# Patient Record
Sex: Female | Born: 1960 | Race: White | Hispanic: No | Marital: Married | Smoking: Current every day smoker
Health system: Southern US, Community
[De-identification: ages and names within clinical notes are randomized; demographics above are authoritative.]

## PROBLEM LIST (undated history)

## (undated) DIAGNOSIS — R531 Weakness: Secondary | ICD-10-CM

## (undated) DIAGNOSIS — M1990 Unspecified osteoarthritis, unspecified site: Secondary | ICD-10-CM

## (undated) DIAGNOSIS — K219 Gastro-esophageal reflux disease without esophagitis: Secondary | ICD-10-CM

## (undated) DIAGNOSIS — F419 Anxiety disorder, unspecified: Secondary | ICD-10-CM

## (undated) DIAGNOSIS — G5600 Carpal tunnel syndrome, unspecified upper limb: Secondary | ICD-10-CM

## (undated) DIAGNOSIS — F32A Depression, unspecified: Secondary | ICD-10-CM

## (undated) DIAGNOSIS — M797 Fibromyalgia: Secondary | ICD-10-CM

## (undated) DIAGNOSIS — J449 Chronic obstructive pulmonary disease, unspecified: Secondary | ICD-10-CM

## (undated) DIAGNOSIS — N200 Calculus of kidney: Secondary | ICD-10-CM

## (undated) DIAGNOSIS — R011 Cardiac murmur, unspecified: Secondary | ICD-10-CM

## (undated) DIAGNOSIS — R4701 Aphasia: Secondary | ICD-10-CM

## (undated) DIAGNOSIS — I10 Essential (primary) hypertension: Secondary | ICD-10-CM

## (undated) DIAGNOSIS — M719 Bursopathy, unspecified: Secondary | ICD-10-CM

## (undated) DIAGNOSIS — E785 Hyperlipidemia, unspecified: Secondary | ICD-10-CM

## (undated) HISTORY — DX: Fibromyalgia: M79.7

## (undated) HISTORY — DX: Chronic obstructive pulmonary disease, unspecified: J44.9

## (undated) HISTORY — DX: Hyperlipidemia, unspecified: E78.5

## (undated) HISTORY — DX: Depression, unspecified: F32.A

## (undated) HISTORY — DX: Gastro-esophageal reflux disease without esophagitis: K21.9

## (undated) HISTORY — DX: Essential (primary) hypertension: I10

## (undated) HISTORY — DX: Unspecified osteoarthritis, unspecified site: M19.90

## (undated) HISTORY — PX: OOPHORECTOMY: SHX86

## (undated) HISTORY — DX: Calculus of kidney: N20.0

## (undated) HISTORY — PX: OTHER SURGICAL HISTORY: SHX169

## (undated) HISTORY — DX: Anxiety disorder, unspecified: F41.9

## (undated) HISTORY — PX: ORIF ANKLE FRACTURE BIMALLEOLAR: SUR920

## (undated) HISTORY — DX: Carpal tunnel syndrome, unspecified upper limb: G56.00

## (undated) HISTORY — PX: CHOLECYSTECTOMY: SHX55

## (undated) HISTORY — DX: Bursopathy, unspecified: M71.9

## (undated) HISTORY — PX: CARPAL TUNNEL RELEASE: SHX101

## (undated) HISTORY — PX: ABLATION: SHX5711

## (undated) HISTORY — PX: EXCISIONAL HEMORRHOIDECTOMY: SHX1541

## (undated) HISTORY — PX: ENDOMETRIAL ABLATION: SHX621

## (undated) HISTORY — PX: ANKLE FRACTURE SURGERY: SHX122

---

## 2007-10-13 DIAGNOSIS — N809 Endometriosis, unspecified: Secondary | ICD-10-CM | POA: Insufficient documentation

## 2007-10-13 DIAGNOSIS — Z87442 Personal history of urinary calculi: Secondary | ICD-10-CM | POA: Insufficient documentation

## 2008-11-07 DIAGNOSIS — M5137 Other intervertebral disc degeneration, lumbosacral region: Secondary | ICD-10-CM | POA: Insufficient documentation

## 2012-08-22 DIAGNOSIS — IMO0002 Reserved for concepts with insufficient information to code with codable children: Secondary | ICD-10-CM | POA: Insufficient documentation

## 2012-08-22 DIAGNOSIS — M5030 Other cervical disc degeneration, unspecified cervical region: Secondary | ICD-10-CM | POA: Insufficient documentation

## 2014-05-06 DIAGNOSIS — F339 Major depressive disorder, recurrent, unspecified: Secondary | ICD-10-CM | POA: Insufficient documentation

## 2014-05-06 DIAGNOSIS — G4700 Insomnia, unspecified: Secondary | ICD-10-CM | POA: Insufficient documentation

## 2014-05-06 DIAGNOSIS — F32A Depression, unspecified: Secondary | ICD-10-CM | POA: Insufficient documentation

## 2014-10-27 DIAGNOSIS — K649 Unspecified hemorrhoids: Secondary | ICD-10-CM

## 2014-10-27 HISTORY — DX: Unspecified hemorrhoids: K64.9

## 2014-11-17 DIAGNOSIS — G894 Chronic pain syndrome: Secondary | ICD-10-CM | POA: Insufficient documentation

## 2015-03-25 DIAGNOSIS — S82852A Displaced trimalleolar fracture of left lower leg, initial encounter for closed fracture: Secondary | ICD-10-CM | POA: Insufficient documentation

## 2015-07-05 DIAGNOSIS — K219 Gastro-esophageal reflux disease without esophagitis: Secondary | ICD-10-CM | POA: Insufficient documentation

## 2016-08-26 DIAGNOSIS — F419 Anxiety disorder, unspecified: Secondary | ICD-10-CM | POA: Insufficient documentation

## 2016-08-27 ENCOUNTER — Ambulatory Visit (INDEPENDENT_AMBULATORY_CARE_PROVIDER_SITE_OTHER): Payer: Self-pay

## 2016-08-27 ENCOUNTER — Encounter (INDEPENDENT_AMBULATORY_CARE_PROVIDER_SITE_OTHER): Payer: Self-pay | Admitting: Orthopaedic Surgery

## 2016-08-27 ENCOUNTER — Ambulatory Visit (INDEPENDENT_AMBULATORY_CARE_PROVIDER_SITE_OTHER): Payer: BLUE CROSS/BLUE SHIELD | Admitting: Orthopaedic Surgery

## 2016-08-27 ENCOUNTER — Ambulatory Visit (INDEPENDENT_AMBULATORY_CARE_PROVIDER_SITE_OTHER): Payer: Self-pay | Admitting: Orthopaedic Surgery

## 2016-08-27 VITALS — BP 127/74 | HR 64 | Resp 15 | Ht 66.0 in | Wt 164.0 lb

## 2016-08-27 DIAGNOSIS — R200 Anesthesia of skin: Secondary | ICD-10-CM

## 2016-08-27 DIAGNOSIS — R202 Paresthesia of skin: Secondary | ICD-10-CM | POA: Diagnosis not present

## 2016-08-27 DIAGNOSIS — M25432 Effusion, left wrist: Secondary | ICD-10-CM | POA: Diagnosis not present

## 2016-08-27 DIAGNOSIS — M25532 Pain in left wrist: Secondary | ICD-10-CM

## 2016-08-27 MED ORDER — DICLOFENAC SODIUM 1 % TD GEL: 2.0000 g | Freq: Four times a day (QID) | TRANSDERMAL | 1 refills | Status: DC

## 2016-08-27 NOTE — Progress Notes (Signed)
Office Visit Note   [name]           Date of Birth: 1960/12/03           MRN: [id number] Visit Date: 08/27/2016              Requested by: No referring provider defined for this encounter. PCP: Angelica Chessman, MD   Assessment & Plan: Visit Diagnoses:  1. Pain and swelling of wrist, left   2. Numbness and tingling of left hand     Plan:  #1: Thumb spica splint to the left hand #2: Voltaren gel to the left wrist #3: Follow back up in 2 weeks for recheck evaluation.   Follow-Up Instructions: Return in about 2 weeks (around 09/10/2016).   Orders:  Orders Placed This Encounter  Procedures  . XR Wrist Complete Left   Meds ordered this encounter  Medications  . diclofenac sodium (VOLTAREN) 1 % GEL    Sig: Apply 2 g topically 4 (four) times daily.    Dispense:  2 Tube    Refill:  1    Order Specific Question:   Supervising Provider    Answer:   [name] [[id number]]      Procedures: No procedures performed   Clinical Data: No additional findings.   Subjective: Chief Complaint  Patient presents with  . Left Wrist - Pain  . Wrist Pain    CTS - 2010, pain, limited range of motion, swelling, no surgery to wrist, not diabtic, no injury, lump, tenderness, no meds, wears brace    Susan Lewis is a 56 year old white female who presents today with significant pain in the left wrist and numbness in the left hand which occurred approximately 3 weeks ago after suddenly stopping gabapentin of 600 mg 4 times a day. She had significant pain and discomfort in the left wrist as well as the numbness that had been earlier diagnosis carpal tunnel syndrome in 2010. She now complains limited range of motion. She has used a brace which is only somewhat beneficial. She is also developed swelling in the radial aspect of the wrist and over the first dorsal compartment. Seen today for evaluation.      Review of Systems  Constitutional: Negative.   HENT: Negative.     Respiratory: Negative.   Cardiovascular: Negative.   Gastrointestinal: Negative.   Genitourinary: Negative.   Skin: Negative.   Neurological: Negative.   Hematological: Negative.   Psychiatric/Behavioral: Negative.      Objective: Vital Signs: BP 127/74 (BP Location: Right Arm, Patient Position: Sitting, Cuff Size: Normal)   Pulse 64   Resp 15   Ht 5\' 6"  (1.676 m)   Wt 164 lb (74.4 kg)   BMI 26.47 kg/m   Physical Exam  Constitutional: She is oriented to person, place, and time. She appears well-developed and well-nourished.  HENT:  Head: Normocephalic and atraumatic.  Eyes: EOM are normal. Pupils are equal, round, and reactive to light.  Pulmonary/Chest: Effort normal.  Neurological: She is alert and oriented to person, place, and time.  Skin: Skin is warm and dry.  Psychiatric: She has a normal mood and affect. Her behavior is normal. Judgment and thought content normal.    Ortho Exam  She has tenderness to palpation over the radial styloid area. She does have little bit of a swelling over the radial styloid. Skin is intact. She has some decreased sensation in the median nerve distribution. Positive Phalen's test. Positive Finkelstein. She has equal  strength in opposition testing bilaterally Specialty Comments:  No specialty comments available.  Imaging: Xr Wrist Complete Left  Result Date: 08/27/2016 Three-view x-ray of the left wrist reveals what may be an evulsion of bone off the radial styloid.. She also has some cystic changes in the scapholunate area.    PMFS History: There are no active problems to display for this patient.  Past Medical History:  Diagnosis Date  . Arthritis   . Bursitis   . Carpal tunnel syndrome   . Fibromyalgia   . Kidney stones     History reviewed. No pertinent family history.  Past Surgical History:  Procedure Laterality Date  . ABLATION    . ANKLE FRACTURE SURGERY    . CESAREAN SECTION    . CHOLECYSTECTOMY    . LEFT OVARY  REMOVAL     Social History   Occupational History  . Not on file.   Social History Main Topics  . Smoking status: Current Every Day Smoker    Packs/day: 1.00    Years: 45.00    Types: Cigarettes    Start date: 08/28/1971  . Smokeless tobacco: Never Used  . Alcohol use No  . Drug use: No  . Sexual activity: Not on file

## 2016-09-20 ENCOUNTER — Ambulatory Visit (INDEPENDENT_AMBULATORY_CARE_PROVIDER_SITE_OTHER): Payer: BLUE CROSS/BLUE SHIELD | Admitting: Orthopaedic Surgery

## 2016-09-20 ENCOUNTER — Encounter (INDEPENDENT_AMBULATORY_CARE_PROVIDER_SITE_OTHER): Payer: Self-pay | Admitting: Orthopaedic Surgery

## 2016-09-20 VITALS — BP 126/71 | HR 63 | Ht 66.0 in | Wt 155.0 lb

## 2016-09-20 DIAGNOSIS — M79642 Pain in left hand: Secondary | ICD-10-CM

## 2016-09-20 DIAGNOSIS — M654 Radial styloid tenosynovitis [de Quervain]: Secondary | ICD-10-CM

## 2016-09-20 DIAGNOSIS — M79641 Pain in right hand: Secondary | ICD-10-CM

## 2016-09-20 MED ORDER — LIDOCAINE HCL 2 % IJ SOLN
1.0000 mL | INTRAMUSCULAR | Status: AC | PRN
  Administered 2016-09-20: 1 mL

## 2016-09-20 MED ORDER — METHYLPREDNISOLONE ACETATE 40 MG/ML IJ SUSP
40.0000 mg | INTRAMUSCULAR | Status: AC | PRN
  Administered 2016-09-20: 40 mg

## 2016-09-20 NOTE — Progress Notes (Signed)
Office Visit Note   [name]           Date of Birth: 02/01/1961           MRN: [id number] Visit Date: 09/20/2016              Requested by: Angelica ChessmanAguiar, Rafaela M, MD [address], [place] [id number] PCP: Angelica ChessmanAguiar, Rafaela M, MD   Assessment & Plan: Visit Diagnoses:  1. Bilateral hand pain   2. Pain of left hand   De Quervain's and carpal tunnel syndrome left wrist  Plan: Cortisone injection first dorsal extensor compartment left wrist. EMGs and nerve conduction studies both upper extremities. Continue with splint immobilization on the left.  Follow-Up Instructions: Return after EMG's.   Orders:  Orders Placed This Encounter  Procedures  . Ambulatory referral to Physical Medicine Rehab   No orders of the defined types were placed in this encounter.     Procedures: Hand/UE Inj Date/Time: 09/20/2016 9:15 AM Performed by: Valeria BatmanWHITFIELD, Canuto Kingston W Authorized by: Valeria BatmanWHITFIELD, Emlyn Maves W   Consent Given by:  Patient Indications:  Joint swelling Condition: de Quervain's   Site:  L extensor compartment 1 Needle Size:  27 G Approach:  Radial Medications:  1 mL lidocaine 2 %; 40 mg methylPREDNISolone acetate 40 MG/ML     Clinical Data: No additional findings.   Subjective: Chief Complaint  Patient presents with  . Left Wrist - Pain    [name] is a 56 y o that presents with L wrist Pain and swelling .  Numbness and tingling of left hand . She has been wearing the thumb spica and voltaren gel. Less painful.    Susan Lewis has a combination of de Quervain's and carpal tunnel syndrome of the left wrist. I tried a period. Of immobilization and she is still having some trouble.  HPI  Review of Systems   Objective: Vital Signs: BP 126/71   Pulse 63   Ht 5\' 6"  (1.676 m)   Wt 155 lb (70.3 kg)   BMI 25.02 kg/m   Physical Exam  Ortho Exam local tenderness over the first dorsal extensor compartment along the radial aspect of the left wrist. Mild  swelling. No skin change. Neurovascular exam intact. No pain with motion of the base of the thumb. Positive Phalen's and Tinel's left wrist. Full range of motion of fingers. Able to oppose thumb to little finger with good strength.  Specialty Comments:  No specialty comments available.  Imaging: No results found.   PMFS History: There are no active problems to display for this patient.  Past Medical History:  Diagnosis Date  . Arthritis   . Bursitis   . Carpal tunnel syndrome   . Fibromyalgia   . Kidney stones     History reviewed. No pertinent family history.  Past Surgical History:  Procedure Laterality Date  . ABLATION    . ANKLE FRACTURE SURGERY    . CESAREAN SECTION    . CHOLECYSTECTOMY    . LEFT OVARY REMOVAL     Social History   Occupational History  . Not on file.   Social History Main Topics  . Smoking status: Current Every Day Smoker    Packs/day: 1.00    Years: 45.00    Types: Cigarettes    Start date: 08/28/1971  . Smokeless tobacco: Never Used  . Alcohol use No  . Drug use: No  . Sexual activity: Not on file  Susan Balding, MD   Note - This record has been created using [employer].  Chart creation errors have been sought, but may not always  have been located. Such creation errors do not reflect on  the standard of medical care.

## 2016-10-17 ENCOUNTER — Encounter (INDEPENDENT_AMBULATORY_CARE_PROVIDER_SITE_OTHER): Payer: Self-pay | Admitting: Physical Medicine and Rehabilitation

## 2016-10-17 ENCOUNTER — Ambulatory Visit (INDEPENDENT_AMBULATORY_CARE_PROVIDER_SITE_OTHER): Payer: BLUE CROSS/BLUE SHIELD | Admitting: Physical Medicine and Rehabilitation

## 2016-10-17 VITALS — Ht 66.0 in | Wt 160.0 lb

## 2016-10-17 DIAGNOSIS — R202 Paresthesia of skin: Secondary | ICD-10-CM | POA: Diagnosis not present

## 2016-10-18 NOTE — Progress Notes (Signed)
Susan Lewis - 56 y.o. female MRN [id number]  Date of birth: 04/23/1960  Office Visit Note: Visit Date: 10/17/2016 PCP: Susan Lewis, Rafaela M, MD Referred by: Susan Lewis, Rafaela M, MD  Subjective: Chief Complaint  Patient presents with  . Right Wrist - Pain  . Left Wrist - Pain, Numbness   HPI: Susan Lewis is a 56 year old right-hand dominant female with chronic worsening history of pain with numbness and tingling in both hands with actually the left hand pain worse than the right hand pain. She reports initial symptoms on the right side with progressive numbness which is now constant on the right in the radial digits. She does have other pain complaints but no frank radicular pain down the arms. She does have pain with opening jars and she has been dropping objects on the right. She has felt like she has decreased strength on the right. She reports no prior carpal tunnel release. She does report prior electrodiagnostic studies. These were done approximately in 2010. She is unsure where she had those done. They did tell her that she should consider surgery on the right hand at that point but she did not do it because of financial reasons. She reports progressive worsening since that point. Unfortunately we do not have those electrodiagnostic studies to review. Her case is complicated by history of fibromyalgia.    ROS Otherwise per HPI.  Assessment & Plan: Visit Diagnoses:  1. Paresthesia of skin     Plan: No additional findings.  Impression: The above electrodiagnostic study is ABNORMAL and reveals evidence of a severe right median nerve entrapment at the wrist (carpal tunnel syndrome) affecting sensory and motor components. The lesion is characterized by sensory and motor demyelination with evidence of significant axonal injury.   There is no significant electrodiagnostic evidence of any other focal nerve entrapment (specifically no left-sided median nerve slowing), brachial plexopathy or  generalized peripheral neuropathy.   Recommendations: 1.  Follow-up with referring physician. 2.  Continue current management of symptoms. 3.  Suggest surgical evaluation.   Meds & Orders: No orders of the defined types were placed in this encounter.   Orders Placed This Encounter  Procedures  . NCV with EMG (electromyography)    Follow-up: Return for Dr. Cleophas DunkerWhitfield.   Procedures: No procedures performed  No notes on file   Clinical History: No specialty comments available.  She reports that she has been smoking Cigarettes.  She started smoking about 45 years ago. She has a 45.00 pack-year smoking history. She has never used smokeless tobacco. No results for input(s): HGBA1C, LABURIC in the last 8760 hours.  Objective:  VS:  HT:5\' 6"  (167.6 cm)   WT:160 lb (72.6 kg)  BMI:25.9    BP:   HR: bpm  TEMP: ( )  RESP:  Physical Exam  Musculoskeletal:  Inspection reveals osteoarthritic changes of the hands bilaterally with osteoarthritis of the CMC joints bilaterally and significant atrophy of the right APB but no atrophy of the left APB or bilateral FDI or hand intrinsics. There is no swelling, color changes, allodynia or dystrophic changes. There is 5 out of 5 strength in the bilateral wrist extension, finger abduction and long finger flexion. There is decreased sensation in the right medial nerve distribution.     Ortho Exam Imaging: No results found.  Past Medical/Family/Surgical/Social History: Medications & Allergies reviewed per EMR There are no active problems to display for this patient.  Past Medical History:  Diagnosis Date  . Arthritis   . Bursitis   .  Carpal tunnel syndrome   . Fibromyalgia   . Kidney stones    No family history on file. Past Surgical History:  Procedure Laterality Date  . ABLATION    . ANKLE FRACTURE SURGERY    . CESAREAN SECTION    . CHOLECYSTECTOMY    . LEFT OVARY REMOVAL     Social History   Occupational History  . Not on file.    Social History Main Topics  . Smoking status: Current Every Day Smoker    Packs/day: 1.00    Years: 45.00    Types: Cigarettes    Start date: 08/28/1971  . Smokeless tobacco: Never Used  . Alcohol use No  . Drug use: No  . Sexual activity: Not on file

## 2016-10-18 NOTE — Procedures (Signed)
EMG & NCV Findings: Evaluation of the right median motor nerve showed prolonged distal onset latency (8.6 ms), reduced amplitude (0.4 mV), and decreased conduction velocity (Elbow-Wrist, 30 m/s).  The left median (across palm) sensory nerve showed prolonged distal peak latency (Palm, 8.4 ms).  The right median (across palm) sensory nerve showed no response (Palm) and prolonged distal peak latency (4.7 ms).  All remaining nerves (as indicated in the following tables) were within normal limits.  Left vs. Right side comparison data for the median motor nerve indicates abnormal L-R latency difference (5.1 ms), abnormal L-R amplitude difference (94.8 %), and abnormal L-R velocity difference (Elbow-Wrist, 26 m/s).  All remaining left vs. right side differences were within normal limits.    All examined muscles (as indicated in the following table) showed no evidence of electrical instability.    Impression: The above electrodiagnostic study is ABNORMAL and reveals evidence of a severe right median nerve entrapment at the wrist (carpal tunnel syndrome) affecting sensory and motor components. The lesion is characterized by sensory and motor demyelination with evidence of significant axonal injury.   There is no significant electrodiagnostic evidence of any other focal nerve entrapment (specifically no left-sided median nerve slowing), brachial plexopathy or generalized peripheral neuropathy.   Recommendations: 1.  Follow-up with referring physician. 2.  Continue current management of symptoms. 3.  Suggest surgical evaluation.    Nerve Conduction Studies Anti Sensory Summary Table   Stim Site NR Peak (ms) Norm Peak (ms) P-T Amp (V) Norm P-T Amp Site1 Site2 Delta-P (ms) Dist (cm) Vel (m/s) Norm Vel (m/s)  Left Median Acr Palm Anti Sensory (2nd Digit)  32.7C  Wrist    3.6 <3.6 32.2 >10 Wrist Palm 4.8 0.0    Palm    *8.4 <2.0 8.4         Right Median Acr Palm Anti Sensory (2nd Digit)  33.2C  Wrist     *4.7 <3.6 14.6 >10 Wrist Palm  0.0    Palm *NR  <2.0          Left Radial Anti Sensory (Base 1st Digit)  32.8C  Wrist    1.7 <3.1 37.6  Wrist Base 1st Digit 1.7 0.0    Right Radial Anti Sensory (Base 1st Digit)  32.1C  Wrist    1.8 <3.1 24.5  Wrist Base 1st Digit 1.8 0.0    Left Ulnar Anti Sensory (5th Digit)  33.3C  Wrist    3.3 <3.7 22.4 >15.0 Wrist 5th Digit 3.3 14.0 42 >38  Right Ulnar Anti Sensory (5th Digit)  32.7C  Wrist    3.2 <3.7 22.9 >15.0 Wrist 5th Digit 3.2 14.0 44 >38   Motor Summary Table   Stim Site NR Onset (ms) Norm Onset (ms) O-P Amp (mV) Norm O-P Amp Site1 Site2 Delta-0 (ms) Dist (cm) Vel (m/s) Norm Vel (m/s)  Left Median Motor (Abd Poll Brev)  33.1C  Wrist    3.5 <4.2 7.7 >5 Elbow Wrist 3.6 20.0 56 >50  Elbow    7.1  7.6         Right Median Motor (Abd Poll Brev)  32C  Wrist    *8.6 <4.2 *0.4 >5 Elbow Wrist 6.7 20.0 *30 >50  Elbow    15.3  0.3         Left Ulnar Motor (Abd Dig Min)  33.4C  Wrist    2.9 <4.2 9.1 >3 B Elbow Wrist 3.2 19.5 61 >53  B Elbow    6.1  8.8  A Elbow B Elbow 1.3 10.0 77 >53  A Elbow    7.4  9.1         Right Ulnar Motor (Abd Dig Min)  32C  Wrist    2.6 <4.2 11.1 >3 B Elbow Wrist 3.4 20.0 59 >53  B Elbow    6.0  10.3  A Elbow B Elbow 1.3 9.5 73 >53  A Elbow    7.3  10.5          EMG   Side Muscle Nerve Root Ins Act Fibs Psw Amp Dur Poly Recrt Int Dennie BiblePat Comment  Right Abd Poll Brev Median C8-T1 Nml Nml Nml Nml Nml 0 Nml Nml   Right 1stDorInt Ulnar C8-T1 Nml Nml Nml Nml Nml 0 Nml Nml     Nerve Conduction Studies Anti Sensory Left/Right Comparison   Stim Site L Lat (ms) R Lat (ms) L-R Lat (ms) L Amp (V) R Amp (V) L-R Amp (%) Site1 Site2 L Vel (m/s) R Vel (m/s) L-R Vel (m/s)  Median Acr Palm Anti Sensory (2nd Digit)  32.7C  Wrist 3.6 *4.7 1.1 32.2 14.6 54.7 Wrist Palm     Palm *8.4   8.4         Radial Anti Sensory (Base 1st Digit)  32.8C  Wrist 1.7 1.8 0.1 37.6 24.5 34.8 Wrist Base 1st Digit     Ulnar Anti Sensory (5th  Digit)  33.3C  Wrist 3.3 3.2 0.1 22.4 22.9 2.2 Wrist 5th Digit 42 44 2   Motor Left/Right Comparison   Stim Site L Lat (ms) R Lat (ms) L-R Lat (ms) L Amp (mV) R Amp (mV) L-R Amp (%) Site1 Site2 L Vel (m/s) R Vel (m/s) L-R Vel (m/s)  Median Motor (Abd Poll Brev)  33.1C  Wrist 3.5 *8.6 *5.1 7.7 *0.4 *94.8 Elbow Wrist 56 *30 *26  Elbow 7.1 15.3 8.2 7.6 0.3 96.1       Ulnar Motor (Abd Dig Min)  33.4C  Wrist 2.9 2.6 0.3 9.1 11.1 18.0 B Elbow Wrist 61 59 2  B Elbow 6.1 6.0 0.1 8.8 10.3 14.6 A Elbow B Elbow 77 73 4  A Elbow 7.4 7.3 0.1 9.1 10.5 13.3          Waveforms:

## 2016-11-11 ENCOUNTER — Ambulatory Visit (INDEPENDENT_AMBULATORY_CARE_PROVIDER_SITE_OTHER): Payer: BLUE CROSS/BLUE SHIELD | Admitting: Orthopaedic Surgery

## 2016-11-11 ENCOUNTER — Encounter (INDEPENDENT_AMBULATORY_CARE_PROVIDER_SITE_OTHER): Payer: Self-pay | Admitting: Orthopaedic Surgery

## 2016-11-11 VITALS — BP 137/87 | HR 70 | Resp 14 | Ht 67.0 in | Wt 160.0 lb

## 2016-11-11 DIAGNOSIS — G5601 Carpal tunnel syndrome, right upper limb: Secondary | ICD-10-CM

## 2016-11-11 NOTE — Progress Notes (Signed)
Office Visit Note   [name]           Date of Birth: [birth year]           MRN: [id number] Visit Date: 11/11/2016              Requested by: Angelica Chessman, MD [address] [place], [place] [address] PCP: Angelica Chessman, MD   Assessment & Plan: Visit Diagnoses:  1. Carpal tunnel syndrome, right upper limb     Plan: We'll schedule right carpal tunnel release. Discussed EMG and nerve conduction findings and patient would like to proceed. Spent approximately 30 minutes discussing results of the EMGs and nerve conduction studies treatment options and what she may expect over time with her left wrist and de Quervain's  Follow-Up Instructions: Return will schedule surgery.   Orders:  No orders of the defined types were placed in this encounter.  No orders of the defined types were placed in this encounter.     Procedures: No procedures performed   Clinical Data: No additional findings.   Subjective: Chief Complaint  Patient presents with  . Right Hand - Pain, Weakness, Numbness  . Left Hand - Pain, Numbness, Weakness  EMG and nerve conduction studies demonstrate a severe right median nerve entrapment at the wrist affecting sensory and motor components. Susan Lewis continues to have a problem with numbness tingling or weakness and dropping objects. She also relates that the cortisone injection for the de Quervain's left wrist. A big difference and she no longer is symptomatic she denies shortness of breath chest pain or neck discomfort.  HPI  Review of Systems  Constitutional: Negative for chills, fatigue and fever.  Eyes: Negative for itching.  Respiratory: Negative for chest tightness and shortness of breath.   Cardiovascular: Negative for chest pain, palpitations and leg swelling.  Gastrointestinal: Negative for blood in stool, constipation and diarrhea.  Musculoskeletal: Negative for back pain, joint swelling, neck pain and neck  stiffness.  Neurological: Positive for weakness and numbness. Negative for dizziness and headaches.  Hematological: Does not bruise/bleed easily.  Psychiatric/Behavioral: Negative for sleep disturbance. The patient is nervous/anxious.      Objective: Vital Signs: BP 137/87   Pulse 70   Resp 14   Ht 5\' 7"  (1.702 m)   Wt 160 lb (72.6 kg)   BMI 25.06 kg/m   Physical Exam  Ortho Exam no tenderness over the first dorsal extensor compartment left wrist. Skin intact. Neurovascular exam intact. Right hand with positive Tinel's and Phalen's. Atrophy of thenar musculature. Able to oppose thumb to little finger. Skin intact .no pain over all nerve at wrist or elbow  Specialty Comments:  No specialty comments available.  Imaging: No results found.   PMFS History: There are no active problems to display for this patient.  Past Medical History:  Diagnosis Date  . Arthritis   . Bursitis   . Carpal tunnel syndrome   . Fibromyalgia   . Kidney stones     History reviewed. No pertinent family history.  Past Surgical History:  Procedure Laterality Date  . ABLATION    . ANKLE FRACTURE SURGERY    . CESAREAN SECTION    . CHOLECYSTECTOMY    . LEFT OVARY REMOVAL     Social History   Occupational History  . Not on file.   Social History Main Topics  . Smoking status: Current Every Day Smoker    Packs/day: 1.00    Years:  45.00    Types: Cigarettes    Start date: 08/28/1971  . Smokeless tobacco: Never Used  . Alcohol use No  . Drug use: No  . Sexual activity: Not on file

## 2016-11-13 ENCOUNTER — Encounter (INDEPENDENT_AMBULATORY_CARE_PROVIDER_SITE_OTHER): Payer: Self-pay | Admitting: Orthopaedic Surgery

## 2016-12-16 ENCOUNTER — Inpatient Hospital Stay (INDEPENDENT_AMBULATORY_CARE_PROVIDER_SITE_OTHER): Payer: BLUE CROSS/BLUE SHIELD | Admitting: Orthopaedic Surgery

## 2017-10-21 ENCOUNTER — Encounter (HOSPITAL_COMMUNITY): Payer: Self-pay

## 2017-10-21 ENCOUNTER — Other Ambulatory Visit: Payer: Self-pay

## 2017-10-21 ENCOUNTER — Emergency Department (HOSPITAL_COMMUNITY)
Admission: EM | Admit: 2017-10-21 | Discharge: 2017-10-21 | Disposition: A | Discharge status: Home / Self Care | Payer: Self-pay | Attending: Emergency Medicine | Admitting: Emergency Medicine

## 2017-10-21 ENCOUNTER — Ambulatory Visit (HOSPITAL_COMMUNITY)
Admission: EM | Admit: 2017-10-21 | Discharge: 2017-10-21 | Disposition: A | Discharge status: Home / Self Care | Payer: No Typology Code available for payment source | Source: Ambulatory Visit | Attending: Emergency Medicine | Admitting: Emergency Medicine

## 2017-10-21 DIAGNOSIS — Z79899 Other long term (current) drug therapy: Secondary | ICD-10-CM | POA: Insufficient documentation

## 2017-10-21 DIAGNOSIS — T7421XA Adult sexual abuse, confirmed, initial encounter: Secondary | ICD-10-CM | POA: Insufficient documentation

## 2017-10-21 DIAGNOSIS — F17210 Nicotine dependence, cigarettes, uncomplicated: Secondary | ICD-10-CM | POA: Insufficient documentation

## 2017-10-21 DIAGNOSIS — R070 Pain in throat: Secondary | ICD-10-CM | POA: Insufficient documentation

## 2017-10-21 DIAGNOSIS — Z0441 Encounter for examination and observation following alleged adult rape: Secondary | ICD-10-CM | POA: Diagnosis present

## 2017-10-21 LAB — RAPID HIV SCREEN (HIV 1/2 AB+AG)
HIV 1/2 Antibodies: NONREACTIVE
HIV-1 P24 ANTIGEN - HIV24: NONREACTIVE

## 2017-10-21 LAB — COMPREHENSIVE METABOLIC PANEL
ALBUMIN: 4.3 g/dL (ref 3.5–5.0)
ALT: 17 U/L (ref 0–44)
ANION GAP: 11 (ref 5–15)
AST: 22 U/L (ref 15–41)
Alkaline Phosphatase: 102 U/L (ref 38–126)
BUN: 12 mg/dL (ref 6–20)
CO2: 27 mmol/L (ref 22–32)
Calcium: 9.3 mg/dL (ref 8.9–10.3)
Chloride: 105 mmol/L (ref 98–111)
Creatinine, Ser: 0.64 mg/dL (ref 0.44–1.00)
GFR calc Af Amer: >60 mL/min (ref >60)
GFR calc non Af Amer: >60 mL/min (ref >60)
GLUCOSE: 113 mg/dL — AB (ref 70–99)
POTASSIUM: 3 mmol/L — AB (ref 3.5–5.1)
SODIUM: 143 mmol/L (ref 135–145)
Total Bilirubin: 0.7 mg/dL (ref 0.3–1.2)
Total Protein: 7.8 g/dL (ref 6.5–8.1)

## 2017-10-21 LAB — CBC WITH DIFFERENTIAL/PLATELET
Basophils Absolute: 0 10*3/uL (ref 0.0–0.1)
Basophils Relative: 0 %
Eosinophils Absolute: 0 10*3/uL (ref 0.0–0.7)
Eosinophils Relative: 0 %
HEMATOCRIT: 44.9 % (ref 36.0–46.0)
HEMOGLOBIN: 14.9 g/dL (ref 12.0–15.0)
LYMPHS ABS: 2.1 10*3/uL (ref 0.7–4.0)
Lymphocytes Relative: 13 %
MCH: 29.5 pg (ref 26.0–34.0)
MCHC: 33.2 g/dL (ref 30.0–36.0)
MCV: 88.9 fL (ref 78.0–100.0)
MONOS PCT: 5 %
Monocytes Absolute: 0.8 10*3/uL (ref 0.1–1.0)
NEUTROS ABS: 13.4 10*3/uL — AB (ref 1.7–7.7)
NEUTROS PCT: 82 %
Platelets: 285 10*3/uL (ref 150–400)
RBC: 5.05 MIL/uL (ref 3.87–5.11)
RDW: 14.1 % (ref 11.5–15.5)
WBC: 16.3 10*3/uL — ABNORMAL HIGH (ref 4.0–10.5)

## 2017-10-21 LAB — RAPID URINE DRUG SCREEN, HOSP PERFORMED
Amphetamines: NOT DETECTED
Barbiturates: NOT DETECTED
Benzodiazepines: NOT DETECTED
Cocaine: POSITIVE — AB
Opiates: NOT DETECTED
Tetrahydrocannabinol: POSITIVE — AB

## 2017-10-21 LAB — I-STAT BETA HCG BLOOD, ED (MC, WL, AP ONLY): I-stat hCG, quantitative: <5 m[IU]/mL (ref <5)

## 2017-10-21 LAB — RPR: RPR Ser Ql: NONREACTIVE

## 2017-10-21 MED ORDER — CEFTRIAXONE SODIUM 250 MG IJ SOLR
250.0000 mg | Freq: Once | INTRAMUSCULAR | Status: AC
  Administered 2017-10-21: 250 mg via INTRAMUSCULAR
  Filled 2017-10-21: qty 250

## 2017-10-21 MED ORDER — ELVITEG-COBIC-EMTRICIT-TENOFAF 150-150-200-10 MG PO TABS: 1.0000 | ORAL_TABLET | Freq: Every day | ORAL | 0 refills | Status: DC

## 2017-10-21 MED ORDER — AZITHROMYCIN 250 MG PO TABS
1000.0000 mg | ORAL_TABLET | Freq: Once | ORAL | Status: AC
Start: 2017-10-21 — End: 2017-10-21
  Administered 2017-10-21: 1000 mg via ORAL
  Filled 2017-10-21: qty 4

## 2017-10-21 MED ORDER — ELVITEG-COBIC-EMTRICIT-TENOFAF 150-150-200-10 MG PREPACK
5.0000 | ORAL_TABLET | Freq: Once | ORAL | Status: DC
  Filled 2017-10-21 (×3): qty 5

## 2017-10-21 MED ORDER — LIDOCAINE HCL 1 % IJ SOLN
INTRAMUSCULAR | Status: AC
  Administered 2017-10-21: 1.2 mL
  Filled 2017-10-21: qty 20

## 2017-10-21 MED ORDER — METRONIDAZOLE 500 MG PO TABS
2000.0000 mg | ORAL_TABLET | Freq: Once | ORAL | Status: AC
  Administered 2017-10-21: 2000 mg via ORAL
  Filled 2017-10-21: qty 4

## 2017-10-21 MED ORDER — PROMETHAZINE HCL 25 MG PO TABS
25.0000 mg | ORAL_TABLET | Freq: Once | ORAL | Status: AC
  Administered 2017-10-21: 25 mg via ORAL
  Filled 2017-10-21: qty 1

## 2017-10-21 MED FILL — GENVOYA TABLET: 150-150-200 | 28 days supply | Qty: 28 | Fill #0

## 2017-10-21 NOTE — ED Notes (Signed)
Pt discharge instructions given and gone over with by SANE nurse.  Pt was walked out to her spouse by SANe nurse.

## 2017-10-21 NOTE — Consult Note (Addendum)
10/21/17 @ 16:47- Verbal prescription called to Daph[facility] at [facility] for 23 tablets of Genvoya 150-150-200-10, one a day for 23 days. Prescription called in due to script entered in Epic [facility] [place]. [facility] Coupon Card obtain [facility] RxBIN: [id number] RxPCN: ACCESS RxGRP: [id number] ID: [id number]

## 2017-10-21 NOTE — ED Notes (Signed)
Bed: NG29WA18 Expected date:  Expected time:  Means of arrival:  Comments: Susan Lewis

## 2017-10-21 NOTE — ED Notes (Addendum)
Pt stated that she urinated on herself several times and requested to changes clothes Pt given hospital gown to put on Pt clothing (bra, shorts, belt, shirt, underwear) all placed in brown paper bags and labeled with pt label Paper bags also labeled with my initials, date, and time Paper bags left in pt's rm

## 2017-10-21 NOTE — ED Notes (Signed)
SANE at bedside

## 2017-10-21 NOTE — ED Notes (Signed)
Offered patient tylenol for pain, pt reporting generalized pain. Pt refused tylenol at this time.

## 2017-10-21 NOTE — ED Notes (Signed)
SANE nurse has arrived and report given to her for the same.
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
## 2017-10-21 NOTE — ED Notes (Signed)
SANE asking about Genvoya medications.  [place], was instructed by Lytle Michaels, that medications is in ED pyxis under SANE kit 3, even though Epic shows medication is supplied by main pharmacy.  Was able to pull medications from pyxis to given to SANE RN at bedside.
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
## 2017-10-22 LAB — HEPATITIS C ANTIBODY: HCV Ab: <0.1 s/co ratio (ref 0.0–0.9)

## 2017-10-22 LAB — HEPATITIS B SURFACE ANTIGEN: Hepatitis B Surface Ag: NEGATIVE

## 2017-10-27 NOTE — SANE Note (Signed)
Follow-up Phone Call  Patient gives verbal consent for a FNE/SANE follow-up phone call in 48-72 hours: yes Patient's telephone number: [phone] Patient gives verbal consent to leave voicemail at the phone number listed above: yes DO NOT CALL between the hours of: unsure; patient seen by Parks Neptunericia Heafner, RN

## 2017-10-27 NOTE — SANE Note (Signed)
Follow up phone call made to patient at approximately 1918 on October 27, 2017.  Voicemail message left for patient with office phone number in case she has any questions.

## 2017-11-06 DIAGNOSIS — I10 Essential (primary) hypertension: Secondary | ICD-10-CM | POA: Insufficient documentation

## 2018-04-06 DIAGNOSIS — Z91410 Personal history of adult physical and sexual abuse: Secondary | ICD-10-CM | POA: Insufficient documentation

## 2018-04-17 DIAGNOSIS — M797 Fibromyalgia: Secondary | ICD-10-CM | POA: Insufficient documentation

## 2018-05-15 DIAGNOSIS — F17200 Nicotine dependence, unspecified, uncomplicated: Secondary | ICD-10-CM | POA: Insufficient documentation

## 2018-06-24 DIAGNOSIS — G5601 Carpal tunnel syndrome, right upper limb: Secondary | ICD-10-CM | POA: Insufficient documentation

## 2018-10-19 DIAGNOSIS — F988 Other specified behavioral and emotional disorders with onset usually occurring in childhood and adolescence: Secondary | ICD-10-CM | POA: Insufficient documentation

## 2018-11-11 DIAGNOSIS — J302 Other seasonal allergic rhinitis: Secondary | ICD-10-CM | POA: Insufficient documentation

## 2018-11-25 DIAGNOSIS — H903 Sensorineural hearing loss, bilateral: Secondary | ICD-10-CM | POA: Insufficient documentation

## 2018-12-16 DIAGNOSIS — M21611 Bunion of right foot: Secondary | ICD-10-CM | POA: Insufficient documentation

## 2018-12-16 DIAGNOSIS — M2011 Hallux valgus (acquired), right foot: Secondary | ICD-10-CM | POA: Insufficient documentation

## 2019-05-31 DIAGNOSIS — F1290 Cannabis use, unspecified, uncomplicated: Secondary | ICD-10-CM | POA: Insufficient documentation

## 2019-08-02 DIAGNOSIS — G5761 Lesion of plantar nerve, right lower limb: Secondary | ICD-10-CM | POA: Insufficient documentation

## 2019-08-10 DIAGNOSIS — M19071 Primary osteoarthritis, right ankle and foot: Secondary | ICD-10-CM | POA: Insufficient documentation

## 2019-12-27 DIAGNOSIS — H2513 Age-related nuclear cataract, bilateral: Secondary | ICD-10-CM | POA: Insufficient documentation

## 2020-01-13 DIAGNOSIS — M5416 Radiculopathy, lumbar region: Secondary | ICD-10-CM | POA: Insufficient documentation

## 2020-05-18 ENCOUNTER — Ambulatory Visit: Payer: Self-pay | Admitting: Nurse Practitioner

## 2020-05-26 ENCOUNTER — Ambulatory Visit: Payer: Self-pay | Admitting: Nurse Practitioner

## 2020-06-28 ENCOUNTER — Other Ambulatory Visit: Payer: Self-pay

## 2020-06-28 ENCOUNTER — Ambulatory Visit (INDEPENDENT_AMBULATORY_CARE_PROVIDER_SITE_OTHER): Payer: 59 | Admitting: Family Medicine

## 2020-06-28 ENCOUNTER — Encounter: Payer: Self-pay | Admitting: Family Medicine

## 2020-06-28 VITALS — BP 118/70 | HR 74 | Temp 97.8°F | Ht 65.25 in | Wt 147.4 lb

## 2020-06-28 DIAGNOSIS — Z1231 Encounter for screening mammogram for malignant neoplasm of breast: Secondary | ICD-10-CM

## 2020-06-28 DIAGNOSIS — J449 Chronic obstructive pulmonary disease, unspecified: Secondary | ICD-10-CM

## 2020-06-28 DIAGNOSIS — M5137 Other intervertebral disc degeneration, lumbosacral region: Secondary | ICD-10-CM

## 2020-06-28 DIAGNOSIS — I10 Essential (primary) hypertension: Secondary | ICD-10-CM | POA: Diagnosis not present

## 2020-06-28 DIAGNOSIS — M797 Fibromyalgia: Secondary | ICD-10-CM

## 2020-06-28 DIAGNOSIS — F32A Depression, unspecified: Secondary | ICD-10-CM

## 2020-06-28 DIAGNOSIS — F17200 Nicotine dependence, unspecified, uncomplicated: Secondary | ICD-10-CM

## 2020-06-28 DIAGNOSIS — K219 Gastro-esophageal reflux disease without esophagitis: Secondary | ICD-10-CM

## 2020-06-28 DIAGNOSIS — Z86010 Personal history of colonic polyps: Secondary | ICD-10-CM | POA: Insufficient documentation

## 2020-06-28 DIAGNOSIS — Z630 Problems in relationship with spouse or partner: Secondary | ICD-10-CM

## 2020-06-28 DIAGNOSIS — E785 Hyperlipidemia, unspecified: Secondary | ICD-10-CM | POA: Diagnosis not present

## 2020-06-28 DIAGNOSIS — M51379 Other intervertebral disc degeneration, lumbosacral region without mention of lumbar back pain or lower extremity pain: Secondary | ICD-10-CM

## 2020-06-28 DIAGNOSIS — Z91410 Personal history of adult physical and sexual abuse: Secondary | ICD-10-CM

## 2020-06-28 DIAGNOSIS — Z716 Tobacco abuse counseling: Secondary | ICD-10-CM

## 2020-06-28 DIAGNOSIS — M21611 Bunion of right foot: Secondary | ICD-10-CM

## 2020-06-28 DIAGNOSIS — F1290 Cannabis use, unspecified, uncomplicated: Secondary | ICD-10-CM

## 2020-06-28 DIAGNOSIS — F4310 Post-traumatic stress disorder, unspecified: Secondary | ICD-10-CM

## 2020-06-28 DIAGNOSIS — M21612 Bunion of left foot: Secondary | ICD-10-CM

## 2020-06-28 DIAGNOSIS — Z62810 Personal history of physical and sexual abuse in childhood: Secondary | ICD-10-CM

## 2020-06-28 DIAGNOSIS — G894 Chronic pain syndrome: Secondary | ICD-10-CM
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
## 2020-06-29 LAB — LIPID PANEL
Cholesterol: 135 mg/dL (ref <200)
HDL: 39 mg/dL — ABNORMAL LOW (ref >=50)
LDL Cholesterol (Calc): 77 mg/dL (calc)
Non-HDL Cholesterol (Calc): 96 mg/dL (calc) (ref <130)
Total CHOL/HDL Ratio: 3.5 (calc) (ref <5.0)
Triglycerides: 105 mg/dL (ref <150)

## 2020-07-02 ENCOUNTER — Encounter (HOSPITAL_BASED_OUTPATIENT_CLINIC_OR_DEPARTMENT_OTHER): Payer: Self-pay

## 2020-07-02 ENCOUNTER — Emergency Department (HOSPITAL_BASED_OUTPATIENT_CLINIC_OR_DEPARTMENT_OTHER)
Admission: EM | Admit: 2020-07-02 | Discharge: 2020-07-02 | Disposition: A | Discharge status: Home / Self Care | Payer: 59 | Attending: Emergency Medicine | Admitting: Emergency Medicine

## 2020-07-02 ENCOUNTER — Other Ambulatory Visit: Payer: Self-pay

## 2020-07-02 DIAGNOSIS — Z79899 Other long term (current) drug therapy: Secondary | ICD-10-CM | POA: Diagnosis not present

## 2020-07-02 DIAGNOSIS — F17210 Nicotine dependence, cigarettes, uncomplicated: Secondary | ICD-10-CM | POA: Diagnosis not present

## 2020-07-02 DIAGNOSIS — M5441 Lumbago with sciatica, right side: Secondary | ICD-10-CM | POA: Diagnosis not present

## 2020-07-02 DIAGNOSIS — I10 Essential (primary) hypertension: Secondary | ICD-10-CM | POA: Diagnosis not present

## 2020-07-02 DIAGNOSIS — M549 Dorsalgia, unspecified: Secondary | ICD-10-CM | POA: Diagnosis present

## 2020-07-02 DIAGNOSIS — J449 Chronic obstructive pulmonary disease, unspecified: Secondary | ICD-10-CM | POA: Insufficient documentation

## 2020-07-02 MED ORDER — METHYLPREDNISOLONE 4 MG PO TBPK: ORAL_TABLET | ORAL | 0 refills | Status: DC

## 2020-07-02 MED ORDER — KETOROLAC TROMETHAMINE 60 MG/2ML IM SOLN
60.0000 mg | Freq: Once | INTRAMUSCULAR | Status: AC
  Administered 2020-07-02: 60 mg via INTRAMUSCULAR
  Filled 2020-07-02: qty 2

## 2020-07-02 MED ORDER — FENTANYL CITRATE (PF) 100 MCG/2ML IJ SOLN
50.0000 ug | Freq: Once | INTRAMUSCULAR | Status: DC
  Filled 2020-07-02: qty 2

## 2020-07-02 MED ORDER — FENTANYL CITRATE (PF) 100 MCG/2ML IJ SOLN
75.0000 ug | Freq: Once | INTRAMUSCULAR | Status: AC
  Administered 2020-07-02: 75 ug via INTRAMUSCULAR
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
## 2020-07-02 NOTE — ED Triage Notes (Addendum)
Pt reports chronic back issues with pinched nerve for past year, had been getting spinal injections, now her insurance changed, unable to continue. Saw pmd last week, no changes in medications.  Referred to new spinal specialist.  Currently takes tramadol 50 mg and flexaril, gabapentin, meloxicam.  Did not take prescribed medications this morning, last taken yesterday

## 2020-07-03 ENCOUNTER — Other Ambulatory Visit: Payer: Self-pay

## 2020-07-04 ENCOUNTER — Encounter: Payer: Self-pay | Admitting: Family Medicine

## 2020-07-04 ENCOUNTER — Ambulatory Visit (INDEPENDENT_AMBULATORY_CARE_PROVIDER_SITE_OTHER): Payer: 59 | Admitting: Family Medicine

## 2020-07-04 VITALS — BP 112/68 | HR 65 | Temp 98.5°F | Ht 65.0 in | Wt 150.8 lb

## 2020-07-04 DIAGNOSIS — S7011XD Contusion of right thigh, subsequent encounter: Secondary | ICD-10-CM

## 2020-07-04 DIAGNOSIS — S7001XD Contusion of right hip, subsequent encounter: Secondary | ICD-10-CM

## 2020-07-04 DIAGNOSIS — G894 Chronic pain syndrome: Secondary | ICD-10-CM | POA: Diagnosis not present

## 2020-07-04 MED ORDER — TRAMADOL HCL 50 MG PO TABS: 50.0000 mg | ORAL_TABLET | Freq: Four times a day (QID) | ORAL | 1 refills | Status: DC | PRN

## 2020-07-04 MED ORDER — CYCLOBENZAPRINE HCL 10 MG PO TABS: 10.0000 mg | ORAL_TABLET | Freq: Two times a day (BID) | ORAL | 3 refills | Status: DC | PRN

## 2020-07-04 NOTE — Patient Instructions (Signed)
   May use Flexeril (cyclobenzaprine) 3 times a day and Ultram (tramadol) 3 times a day for the next two weeks.  After two weeks, should reutrn to the normal amounts of Flexeril and tramadol.
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
## 2020-07-07 ENCOUNTER — Telehealth: Payer: Self-pay | Admitting: Family Medicine

## 2020-07-07 ENCOUNTER — Telehealth: Payer: Self-pay

## 2020-07-07 ENCOUNTER — Encounter: Payer: Self-pay | Admitting: Physical Medicine and Rehabilitation

## 2020-07-07 DIAGNOSIS — G894 Chronic pain syndrome: Secondary | ICD-10-CM

## 2020-07-07 MED ORDER — CYCLOBENZAPRINE HCL 10 MG PO TABS: ORAL_TABLET | ORAL | 0 refills | Status: DC

## 2020-07-07 NOTE — Telephone Encounter (Signed)
Spoke to patient, advised on providers recommendations.  She states that she has been doing that. We agreed that she would continue care as directed and if not any better on Monday to call and schedule an appt to come back.  Dm/cma

## 2020-07-07 NOTE — Telephone Encounter (Signed)
lft VM that new RX was sent to the pharmacy.  If any problems to call our office back. Dm/cma

## 2020-07-07 NOTE — Telephone Encounter (Signed)
Spoke Chancy @ [facility], she states that patient picked up last RX of Cyclobenzaprine on 06/14/20. Insurance will not pay for another one right now.  Can you send another RX to the pharmacy with directions for 3 times daily PRN for them to be able to cover the medication.  Please review and advise.  Thanks.  Dm/cma

## 2020-07-07 NOTE — Telephone Encounter (Signed)
Pts husband called. Pt wasseen earlier this week for pain. Steroids aren't  Helping. It will be 2 weeks before ortho can see her if they decide to schedule her. Are there any alternatives to the steroids? Please advise

## 2020-07-07 NOTE — Telephone Encounter (Signed)
Spoke to patient.  Will call pharmacy after lunch and then send message to Dr Veto Kemps. Dm/cma

## 2020-07-07 NOTE — Telephone Encounter (Signed)
Pt is needing Dr. Veto Kemps needs to call in and verify pt can get her cyclobenzaprine (FLEXERIL) 10 MG tablet [[id number]] early. Pharmacy  [facility] [address] - [place], [place] - [address]  [address], [address], [place] [address]  Phone:  [phone] Fax:  [phone]    Please advise pt at [phone].

## 2020-07-07 NOTE — Addendum Note (Signed)
Addended by: Loyola Mast on: 07/07/2020 03:34 PM   Modules accepted: Orders

## 2020-07-10 ENCOUNTER — Telehealth: Payer: Self-pay | Admitting: Family Medicine

## 2020-07-10 NOTE — Telephone Encounter (Signed)
Spoke to patient, she states that the medication is helping her but the pain when she tries to stand is still there. She called the Ortho and was advised that they are reviewing the referral and will get back to her (07/07/20).  Advised patient we could schedule another appt w/provider or she can wait to hear from Ortho.  Advised her to call them back if she hasn't heard anything by Wednesday and she agrees that she will wait and call us back to schedule an appt if she feels she needs anything sooner. Dm/cma

## 2020-07-10 NOTE — Telephone Encounter (Signed)
Pt is having a hard time functioning with her back pain. She can hardly stand up at this point and she needs to go back to work. She has been out of work since 07/01/20. The medication is helping, but she is wanting a solution for this pain. She needs to go back to work. Please advise pt on some other options. Please advise at [phone].

## 2020-07-11 ENCOUNTER — Encounter: Payer: Self-pay | Admitting: Podiatry

## 2020-07-11 ENCOUNTER — Ambulatory Visit (INDEPENDENT_AMBULATORY_CARE_PROVIDER_SITE_OTHER): Payer: 59 | Admitting: Podiatry

## 2020-07-11 ENCOUNTER — Encounter: Payer: Self-pay | Admitting: Family Medicine

## 2020-07-11 ENCOUNTER — Other Ambulatory Visit: Payer: Self-pay

## 2020-07-11 ENCOUNTER — Ambulatory Visit (INDEPENDENT_AMBULATORY_CARE_PROVIDER_SITE_OTHER): Payer: 59

## 2020-07-11 DIAGNOSIS — Q66222 Congenital metatarsus adductus, left foot: Secondary | ICD-10-CM

## 2020-07-11 DIAGNOSIS — M21612 Bunion of left foot: Secondary | ICD-10-CM

## 2020-07-11 DIAGNOSIS — M21611 Bunion of right foot: Secondary | ICD-10-CM

## 2020-07-11 DIAGNOSIS — Q66221 Congenital metatarsus adductus, right foot: Secondary | ICD-10-CM | POA: Diagnosis not present

## 2020-07-11 DIAGNOSIS — M19079 Primary osteoarthritis, unspecified ankle and foot: Secondary | ICD-10-CM | POA: Diagnosis not present

## 2020-07-11 NOTE — Progress Notes (Signed)
  Subjective:  Patient ID: Susan Lewis, female    DOB: 01-30-1961,  MRN: [id number]  Chief Complaint  Patient presents with  . Bunions    60 y.o. female presents with the above complaint. History confirmed with patient.  She has been switching her providers from [facility] to [facility] because of new insurance.  She is a difficulty walking because she has a pinched nerve in her back.  She has a previous history of an ankle fracture on the left side that was treated in 2017.  Objective:  Physical Exam: warm, good capillary refill, no trophic changes or ulcerative lesions, normal DP and PT pulses and normal sensory exam.  Bilaterally she has moderate hallux valgus deformity with pain on palpation over the medial eminence, she has prominent hardware on the lateral fibula  Radiographs: X-ray of both feet and left ankle: Scattered midtarsal degenerative changes, prominent hardware on the lateral fibula with mild arthritis of the anterior ankle.  She has moderate hallux valgus and metatarsus adductus deformity Assessment:   1. Bilateral bunions      Plan:  Patient was evaluated and treated and all questions answered.  Reviewed the radiographs of the ankle and both feet with the patient.  We discussed correction of bunions and what contributes to these.  We also discussed nonsurgical treatment wider shoe gear and gel silicone bunion shield padding.  I discussed with her that is a smoker it would not be advisable for bunion correction at this time.  I think the best procedure for her would be an arthrodesis of the first metatarsophalangeal joint to prevent recurrence, correct the deformity and provide her long-term stability.  She will return if she is able to stop smoking for surgical consult.  Recommend she stop smoking before and after for 6 weeks, would check a cotinine level on her  Return if symptoms worsen or fail to improve.

## 2020-07-18 ENCOUNTER — Telehealth: Payer: Self-pay | Admitting: Physical Medicine and Rehabilitation

## 2020-07-18 NOTE — Telephone Encounter (Signed)
Please advise, pt last seen on 10/17/16 for EMG. Pt last seen Dr. Cleophas Dunker in 2018. Okay to sch OV.

## 2020-07-18 NOTE — Telephone Encounter (Signed)
Ok OV 

## 2020-07-18 NOTE — Telephone Encounter (Signed)
Pt called stating she was doing spinal injections with a previous Dr. but ended up having to find another provider due to change in insurance.. Pt is wanting to know would she need a referral to see Dr. Alvester Morin? She bright health insurance and would like a CB.   [phone] *Ok to speak with husband Apolinar Junes.

## 2020-07-19 NOTE — Telephone Encounter (Signed)
Called pt and LVm #1 

## 2020-07-20 NOTE — Telephone Encounter (Signed)
Called pt and LVM #2 

## 2020-07-21 NOTE — Telephone Encounter (Signed)
Called pt and sch 5/11 

## 2020-07-21 NOTE — Telephone Encounter (Signed)
Pt husband call back and mention she has appt with Dr. Dalene Carrow at the pain clinic on 6/16. Pt husband state she needs to be seen earlier due to her pain. Would it be best for [place] to still sch her OV and cancel Dr. Dalene Carrow appt or keep her appt.

## 2020-07-21 NOTE — Telephone Encounter (Signed)
Called pt and LVM #3 

## 2020-07-22 ENCOUNTER — Other Ambulatory Visit: Payer: Self-pay | Admitting: Family Medicine

## 2020-07-22 DIAGNOSIS — G894 Chronic pain syndrome: Secondary | ICD-10-CM

## 2020-07-22 NOTE — Telephone Encounter (Signed)
Last OV 07/04/20 Last fill 07/04/20  #30/1

## 2020-07-26 ENCOUNTER — Ambulatory Visit (INDEPENDENT_AMBULATORY_CARE_PROVIDER_SITE_OTHER): Payer: 59 | Admitting: Physical Medicine and Rehabilitation

## 2020-07-26 ENCOUNTER — Other Ambulatory Visit: Payer: Self-pay

## 2020-07-26 ENCOUNTER — Encounter: Payer: Self-pay | Admitting: Physical Medicine and Rehabilitation

## 2020-07-26 ENCOUNTER — Telehealth: Payer: Self-pay | Admitting: Physical Medicine and Rehabilitation

## 2020-07-26 DIAGNOSIS — M5416 Radiculopathy, lumbar region: Secondary | ICD-10-CM

## 2020-07-26 DIAGNOSIS — M5116 Intervertebral disc disorders with radiculopathy, lumbar region: Secondary | ICD-10-CM

## 2020-07-26 DIAGNOSIS — M47816 Spondylosis without myelopathy or radiculopathy, lumbar region: Secondary | ICD-10-CM | POA: Diagnosis not present

## 2020-07-26 DIAGNOSIS — G894 Chronic pain syndrome: Secondary | ICD-10-CM | POA: Diagnosis not present

## 2020-07-26 DIAGNOSIS — M5441 Lumbago with sciatica, right side: Secondary | ICD-10-CM

## 2020-07-26 DIAGNOSIS — G8929 Other chronic pain: Secondary | ICD-10-CM

## 2020-07-26 MED ORDER — TRAMADOL HCL 50 MG PO TABS: 50.0000 mg | ORAL_TABLET | Freq: Four times a day (QID) | ORAL | 0 refills | Status: DC | PRN

## 2020-07-26 NOTE — Telephone Encounter (Signed)
Needs auth and scheduling for right L5 TF when note from 5/11 dictated. Can be scheduled as 15 minute on Thursday afternoon.

## 2020-07-26 NOTE — Progress Notes (Signed)
Patient states that she has a pinched nerve in her back. Right sided low back pain radiating down right leg to toes. "feels like the nerve is being twisted." History of injection at [facility]. Numeric Pain Rating Scale and Functional Assessment Average Pain 8   In the last MONTH (on 0-10 scale) has pain interfered with the following?  1. General activity like being  able to carry out your everyday physical activities such as walking, climbing stairs, carrying groceries, or moving a chair?  Rating(10)

## 2020-07-27 ENCOUNTER — Encounter: Payer: Self-pay | Admitting: Physical Medicine and Rehabilitation

## 2020-07-27 ENCOUNTER — Other Ambulatory Visit: Payer: Self-pay

## 2020-07-27 NOTE — Telephone Encounter (Signed)
done
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
## 2020-07-27 NOTE — Telephone Encounter (Signed)
Pt notes needs to be complete for Auth.

## 2020-07-28 ENCOUNTER — Telehealth: Payer: Self-pay | Admitting: Family Medicine

## 2020-07-28 ENCOUNTER — Encounter: Payer: Self-pay | Admitting: Family Medicine

## 2020-07-28 ENCOUNTER — Ambulatory Visit: Payer: 59 | Admitting: Family Medicine

## 2020-07-28 VITALS — BP 114/64 | HR 67 | Temp 97.3°F | Ht 65.0 in | Wt 152.2 lb

## 2020-07-28 DIAGNOSIS — I10 Essential (primary) hypertension: Secondary | ICD-10-CM

## 2020-07-28 DIAGNOSIS — M5137 Other intervertebral disc degeneration, lumbosacral region: Secondary | ICD-10-CM

## 2020-07-28 DIAGNOSIS — I808 Phlebitis and thrombophlebitis of other sites: Secondary | ICD-10-CM

## 2020-07-28 DIAGNOSIS — M2011 Hallux valgus (acquired), right foot: Secondary | ICD-10-CM

## 2020-07-28 DIAGNOSIS — Z716 Tobacco abuse counseling: Secondary | ICD-10-CM | POA: Diagnosis not present

## 2020-07-28 DIAGNOSIS — M2012 Hallux valgus (acquired), left foot: Secondary | ICD-10-CM

## 2020-07-28 DIAGNOSIS — M51379 Other intervertebral disc degeneration, lumbosacral region without mention of lumbar back pain or lower extremity pain: Secondary | ICD-10-CM

## 2020-07-28 DIAGNOSIS — F4310 Post-traumatic stress disorder, unspecified: Secondary | ICD-10-CM

## 2020-07-28 DIAGNOSIS — G894 Chronic pain syndrome: Secondary | ICD-10-CM

## 2020-07-28 DIAGNOSIS — J3489 Other specified disorders of nose and nasal sinuses: Secondary | ICD-10-CM

## 2020-07-28 MED ORDER — NICOTINE 21 MG/24HR TD PT24: 21.0000 mg | MEDICATED_PATCH | TRANSDERMAL | 0 refills | Status: DC

## 2020-07-28 MED ORDER — CYCLOBENZAPRINE HCL 10 MG PO TABS: ORAL_TABLET | ORAL | 0 refills | Status: AC
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
## 2020-07-28 NOTE — Telephone Encounter (Signed)
lft VM that rx is at pharmacy but can't be filled do to last fill was 07/07/20. They will fill the rx on 07/30/20.  Dm/cma

## 2020-07-28 NOTE — Telephone Encounter (Signed)
Pending

## 2020-07-28 NOTE — Telephone Encounter (Signed)
Pt approve Auth# [id number]

## 2020-07-28 NOTE — Telephone Encounter (Signed)
Scheduled for 5/17 with driver.

## 2020-07-28 NOTE — Telephone Encounter (Signed)
Pt is calling in stating she has not gotten her cyclobenzaprine (FLEXERIL) 10 MG tablet [[id number]] the pharmacy told her- they do not have it. I see where we sent it in today and it verified. Please advise.

## 2020-07-31 ENCOUNTER — Other Ambulatory Visit: Payer: Self-pay | Admitting: Family Medicine

## 2020-07-31 ENCOUNTER — Telehealth: Payer: Self-pay | Admitting: Physical Medicine and Rehabilitation

## 2020-07-31 ENCOUNTER — Telehealth: Payer: Self-pay

## 2020-07-31 DIAGNOSIS — G894 Chronic pain syndrome: Secondary | ICD-10-CM

## 2020-07-31 NOTE — Telephone Encounter (Signed)
Called pt and r/s 

## 2020-07-31 NOTE — Telephone Encounter (Signed)
Pt called and was wondering if she could get an earlier time then 2:15 cause she has another appt. Please call her [phone]

## 2020-08-02 NOTE — Telephone Encounter (Signed)
Error

## 2020-08-03 ENCOUNTER — Ambulatory Visit: Payer: Self-pay

## 2020-08-03 ENCOUNTER — Ambulatory Visit (INDEPENDENT_AMBULATORY_CARE_PROVIDER_SITE_OTHER): Payer: 59 | Admitting: Physical Medicine and Rehabilitation

## 2020-08-03 ENCOUNTER — Ambulatory Visit: Payer: 59 | Admitting: Physical Medicine and Rehabilitation

## 2020-08-03 ENCOUNTER — Other Ambulatory Visit: Payer: Self-pay

## 2020-08-03 ENCOUNTER — Encounter: Payer: Self-pay | Admitting: Physical Medicine and Rehabilitation

## 2020-08-03 ENCOUNTER — Encounter: Payer: Self-pay | Admitting: Family Medicine

## 2020-08-03 VITALS — BP 148/81 | HR 93

## 2020-08-03 DIAGNOSIS — M5416 Radiculopathy, lumbar region: Secondary | ICD-10-CM | POA: Diagnosis not present

## 2020-08-03 LAB — HM DIABETES EYE EXAM

## 2020-08-03 MED ORDER — METHYLPREDNISOLONE ACETATE 80 MG/ML IJ SUSP
80.0000 mg | Freq: Once | INTRAMUSCULAR | Status: AC
  Administered 2020-08-03: 80 mg

## 2020-08-03 NOTE — Patient Instructions (Signed)

## 2020-08-03 NOTE — Progress Notes (Signed)
Pt state lower back pain mostly on her right side. Pt state walking, standing and sitting makes the pain worse. Pt state she take pain meds and uses heating pads to help ease her pain.  Numeric Pain Rating Scale and Functional Assessment Average Pain 9   In the last MONTH (on 0-10 scale) has pain interfered with the following?  1. General activity like being  able to carry out your everyday physical activities such as walking, climbing stairs, carrying groceries, or moving a chair?  Rating(8)   +Driver, -BT, -Dye Allergies.

## 2020-08-04 ENCOUNTER — Encounter: Payer: Self-pay | Admitting: Family Medicine

## 2020-08-07 ENCOUNTER — Telehealth: Payer: Self-pay | Admitting: Family Medicine

## 2020-08-07 DIAGNOSIS — M797 Fibromyalgia: Secondary | ICD-10-CM

## 2020-08-07 DIAGNOSIS — F419 Anxiety disorder, unspecified: Secondary | ICD-10-CM

## 2020-08-07 MED ORDER — GABAPENTIN 300 MG PO CAPS: 300.0000 mg | ORAL_CAPSULE | Freq: Two times a day (BID) | ORAL | 3 refills | Status: DC

## 2020-08-07 MED ORDER — SERTRALINE HCL 100 MG PO TABS: 1.5000 | ORAL_TABLET | Freq: Every day | ORAL | 3 refills | Status: DC

## 2020-08-07 NOTE — Telephone Encounter (Signed)
Patient notified VIA phone that RX want sent and will have to check her bottle on other meds.  Dm/cma

## 2020-08-07 NOTE — Telephone Encounter (Signed)
Please advise.   Thanks. Dm/cma  

## 2020-08-07 NOTE — Telephone Encounter (Signed)
Patients son Judie Grieve is calling to see if a rush can be put on this refill request. I informed him that we usually ask they give 24-48 hours turn around time on refills. Please advise.

## 2020-08-07 NOTE — Telephone Encounter (Signed)
Please advise on message below. Thanks. Dm/cma  

## 2020-08-07 NOTE — Telephone Encounter (Signed)
Patient states that her previous provider wrote her Sertraline and Gabapentin. She now needs these refills done by Dr. Veto Kemps. She would also like the rest of her medications checked to see if refills are due as well.

## 2020-08-07 NOTE — Telephone Encounter (Signed)
What is the name of the medication? Susan Lewis (ZOLOFT) 100 MG tablet [[id number]   Have you contacted your pharmacy to request a refill? Pt is needing a refill on this script.  Which pharmacy would you like this sent to? Pharmacy  [facility] [address] - [place], [place] - [address]  [address], [place] [address]  Phone:  [phone] Fax:  [phone]       Patient notified that their request is being sent to the clinical staff for review and that they should receive a call once it is complete. If they do not receive a call within 72 hours they can check with their pharmacy or our office.

## 2020-08-10 ENCOUNTER — Telehealth: Payer: Self-pay

## 2020-08-10 DIAGNOSIS — F419 Anxiety disorder, unspecified: Secondary | ICD-10-CM

## 2020-08-10 MED ORDER — BUSPIRONE HCL 15 MG PO TABS: 15.0000 mg | ORAL_TABLET | Freq: Two times a day (BID) | ORAL | 3 refills | Status: DC

## 2020-08-10 NOTE — Telephone Encounter (Signed)
Received a refill request from [facility] for: Buspirone HCL 15 mg  LR  Hx provider LOV  07/28/20 FOV  None scheduled.   Please review and advise.  Thanks.

## 2020-08-10 NOTE — Addendum Note (Signed)
Addended by: Loyola Mast on: 08/10/2020 04:33 PM   Modules accepted: Orders

## 2020-08-15 ENCOUNTER — Ambulatory Visit: Payer: 59 | Admitting: Orthopaedic Surgery

## 2020-08-15 ENCOUNTER — Ambulatory Visit: Payer: 59 | Admitting: Physical Medicine and Rehabilitation

## 2020-08-19 ENCOUNTER — Other Ambulatory Visit: Payer: Self-pay | Admitting: Family Medicine

## 2020-08-19 DIAGNOSIS — F419 Anxiety disorder, unspecified: Secondary | ICD-10-CM

## 2020-08-21 NOTE — Procedures (Signed)
Lumbosacral Transforaminal Epidural Steroid Injection - Sub-Pedicular Approach with Fluoroscopic Guidance  [name]      Date of Birth: 10-16-1960 MRN: [id number] PCP: Loyola Mast, MD      Visit Date: 08/03/2020   Universal Protocol:    Date/Time: 08/03/2020  Consent Given By: the patient  Position: PRONE  Additional Comments: Vital signs were monitored before and after the procedure. Patient was prepped and draped in the usual sterile fashion. The correct patient, procedure, and site was verified.   Injection Procedure Details:   Procedure diagnoses: Lumbar radiculopathy [M54.16]    Meds Administered:  Meds ordered this encounter  Medications  . methylPREDNISolone acetate (DEPO-MEDROL) injection 80 mg    Laterality: Right  Location/Site:  L5-S1  Needle:5.0 in., 22 ga.  Short bevel or Quincke spinal needle  Needle Placement: Transforaminal  Findings:    -Comments: Excellent flow of contrast along the nerve, nerve root and into the epidural space.  Procedure Details: After squaring off the end-plates to get a true AP view, the C-arm was positioned so that an oblique view of the foramen as noted above was visualized. The target area is just inferior to the "nose of the scotty dog" or sub pedicular. The soft tissues overlying this structure were infiltrated with 2-3 ml. of 1% Lidocaine without Epinephrine.  The spinal needle was inserted toward the target using a "trajectory" view along the fluoroscope beam.  Under AP and lateral visualization, the needle was advanced so it did not puncture dura and was located close the 6 O'Clock position of the pedical in AP tracterory. Biplanar projections were used to confirm position. Aspiration was confirmed to be negative for CSF and/or blood. A 1-2 ml. volume of Isovue-250 was injected and flow of contrast was noted at each level. Radiographs were obtained for documentation purposes.   After attaining the desired  flow of contrast documented above, a 0.5 to 1.0 ml test dose of 0.25% Marcaine was injected into each respective transforaminal space.  The patient was observed for 90 seconds post injection.  After no sensory deficits were reported, and normal lower extremity motor function was noted,   the above injectate was administered so that equal amounts of the injectate were placed at each foramen (level) into the transforaminal epidural space.   Additional Comments:  The patient tolerated the procedure well Dressing: 2 x 2 sterile gauze and Band-Aid    Post-procedure details: Patient was observed during the procedure. Post-procedure instructions were reviewed.  Patient left the clinic in stable condition.

## 2020-08-21 NOTE — Telephone Encounter (Signed)
lft VM to rtn call. Dm/cma   LR 08/10/20 #180 with 3 rf ([facility])

## 2020-08-21 NOTE — Progress Notes (Signed)
Regina Ganci - 60 y.o. female MRN [id number]  Date of birth: Apr 28, 1960  Office Visit Note: Visit Date: 08/03/2020 PCP: Loyola Mast, MD Referred by: Loyola Mast, MD  Subjective: Chief Complaint  Patient presents with  . Lower Back - Pain   HPI:  Suezette Lafave is a 60 y.o. female who comes in today  for planned Right L5-S1 Lumbar Transforaminal epidural steroid injection with fluoroscopic guidance.  The patient has failed conservative care including home exercise, medications, time and activity modification.  This injection will be diagnostic and hopefully therapeutic.  Please see requesting physician notes for further details and justification.  **Just as a matter of no this patient was the last patient of the day and I happened to walk to my car and did see her drive on her own after the injection despite being told not to drive for 2 to 3 hours.  ROS Otherwise per HPI.  Assessment & Plan: Visit Diagnoses:    ICD-10-CM   1. Lumbar radiculopathy  M54.16 XR C-ARM NO REPORT    Epidural Steroid injection    methylPREDNISolone acetate (DEPO-MEDROL) injection 80 mg    Plan: No additional findings.   Meds & Orders:  Meds ordered this encounter  Medications  . methylPREDNISolone acetate (DEPO-MEDROL) injection 80 mg    Orders Placed This Encounter  Procedures  . XR C-ARM NO REPORT  . Epidural Steroid injection    Follow-up: No follow-ups on file.   Procedures: No procedures performed      Clinical History: MRI LUMBAR SPINE WITHOUT CONTRAST   TECHNIQUE:  Multiplanar, multisequence MR imaging of the lumbar spine was  performed. No intravenous contrast was administered.   COMPARISON: Prior radiograph from 10/22/2019.   FINDINGS:  Segmentation: Standard.   Alignment: Physiologicwith preservation of the normal lumbar  lordosis. No listhesis.   Vertebrae: Vertebral body height maintained without acute or chronic  fracture. Bone marrow signal intensity within  normal limits. No  discrete or worrisome osseous lesions. No abnormal marrow edema.   Conus medullaris and cauda equina: Conus extends to the T12-L1  level. Conus and cauda equina appear normal.   Paraspinal and other soft tissues: Unremarkable.   Disc levels:   T10-11: Seen only on sagittal projection. Mild diffuse disc bulge.  No significant spinal stenosis.   T11-12: Disc desiccation without disc bulge. No stenosis.   T12-L1: Disc desiccation with small central/left paracentral disc  protrusion. No significant stenosis.   L1-2: Diffuse disc bulge with disc desiccation, slightly asymmetric  to the left. Mild bilateral facet hypertrophy. No significant spinal  stenosis. Mild left foraminal narrowing. Right neural foramina  remains patent.   L2-3: Mild disc bulge with disc desiccation. Mild facet hypertrophy.  No significant spinal stenosis. Foramina remain patent.   L3-4: Mild diffuse disc bulge with disc desiccation. Mild facet and  ligament flavum hypertrophy. Borderline mild bilateral lateral  recess stenosis. Central canal remains patent. Mild bilateral L3  foraminal narrowing.   L4-5: Diffuse disc bulge with disc desiccation. Superimposed shallow  right foraminal to extraforaminal disc protrusion contacts the  exiting right L4 nerve root (series 7, image 24). Moderate facet and  ligament flavum hypertrophy. Resultant mild canal with moderate  bilateral subarticular stenosis. Moderate right worse than left L4  foraminal narrowing.   L5-S1: Minimal disc bulge. Severe right with moderate left facet  arthrosis. No significant spinal stenosis. Mild right worse than  left foraminal narrowing.   IMPRESSION:  1. Shallow right foraminal to extraforaminal  disc protrusion at  L4-5, contacting the exiting right L4 nerve root.  2. Disc bulging with facet hypertrophy at L4-5 with resultant  moderate bilateral subarticular stenosis, with moderate right worse  than left L4  foraminal narrowing.  3. Additional mild noncompressive disc bulging and facet hypertrophy  elsewhere within the lumbar spine as above. No other significant  spinal stenosis or evidence for neural impingement.    Electronically Signed   By: Rise Mu M.D.   On: 12/21/2019 04:38     Objective:  VS:  HT:    WT:   BMI:     BP:(!) 148/81  HR:93bpm  TEMP: ( )  RESP:  Physical Exam Vitals and nursing note reviewed.  Constitutional:      General: She is not in acute distress.    Appearance: Normal appearance. She is not ill-appearing.  HENT:     Head: Normocephalic and atraumatic.     Right Ear: External ear normal.     Left Ear: External ear normal.  Eyes:     Extraocular Movements: Extraocular movements intact.  Cardiovascular:     Rate and Rhythm: Normal rate.     Pulses: Normal pulses.  Pulmonary:     Effort: Pulmonary effort is normal. No respiratory distress.  Abdominal:     General: There is no distension.     Palpations: Abdomen is soft.  Musculoskeletal:        General: Tenderness present.     Cervical back: Neck supple.     Right lower leg: No edema.     Left lower leg: No edema.     Comments: Patient has good distal strength with no pain over the greater trochanters.  No clonus or focal weakness.  Skin:    Findings: No erythema, lesion or rash.  Neurological:     General: No focal deficit present.     Mental Status: She is alert and oriented to person, place, and time.     Sensory: No sensory deficit.     Motor: No weakness or abnormal muscle tone.     Coordination: Coordination normal.  Psychiatric:        Mood and Affect: Mood normal.        Behavior: Behavior normal.      Imaging: No results found.

## 2020-08-22 ENCOUNTER — Other Ambulatory Visit: Payer: Self-pay

## 2020-08-22 ENCOUNTER — Ambulatory Visit (INDEPENDENT_AMBULATORY_CARE_PROVIDER_SITE_OTHER): Payer: 59 | Admitting: Orthopaedic Surgery

## 2020-08-22 ENCOUNTER — Encounter: Payer: Self-pay | Admitting: Orthopaedic Surgery

## 2020-08-22 DIAGNOSIS — M2011 Hallux valgus (acquired), right foot: Secondary | ICD-10-CM | POA: Diagnosis not present

## 2020-08-22 DIAGNOSIS — M2012 Hallux valgus (acquired), left foot: Secondary | ICD-10-CM
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
## 2020-08-27 ENCOUNTER — Other Ambulatory Visit: Payer: Self-pay | Admitting: Family Medicine

## 2020-08-27 DIAGNOSIS — G894 Chronic pain syndrome: Secondary | ICD-10-CM

## 2020-08-28 NOTE — Telephone Encounter (Signed)
Refill request for:  Tramadol 50 mg LR 07/26/20, # 30, o rf LOV 07/28/20 FOV none scheduled.   Please review and advise.  Thanks.   Dm/cma

## 2020-08-31 ENCOUNTER — Ambulatory Visit: Payer: 59 | Admitting: Physical Medicine and Rehabilitation

## 2020-09-19 ENCOUNTER — Telehealth: Payer: Self-pay

## 2020-09-19 NOTE — Telephone Encounter (Signed)
Patient called she is requesting a appointment with Dr.Newton for a injection all back:[phone]

## 2020-09-21 NOTE — Telephone Encounter (Signed)
I called patient. No new symptoms, recent fall or trauma. Repeat injection scheduled for 10/03/2020 at 1:30pm.

## 2020-09-22 ENCOUNTER — Encounter: Payer: Self-pay | Admitting: Physical Medicine and Rehabilitation

## 2020-09-25 ENCOUNTER — Encounter: Payer: Self-pay | Admitting: Physical Medicine and Rehabilitation

## 2020-10-01 ENCOUNTER — Other Ambulatory Visit: Payer: Self-pay | Admitting: Family Medicine

## 2020-10-01 DIAGNOSIS — G894 Chronic pain syndrome: Secondary | ICD-10-CM

## 2020-10-03 ENCOUNTER — Encounter: Payer: 59 | Admitting: Physical Medicine and Rehabilitation

## 2020-10-11 ENCOUNTER — Telehealth: Payer: Self-pay | Admitting: Physical Medicine and Rehabilitation

## 2020-10-11 ENCOUNTER — Encounter: Payer: 59 | Admitting: Physical Medicine and Rehabilitation

## 2020-10-11 NOTE — Telephone Encounter (Signed)
Pt just called and states she needs to cancel her appt she has an emergency! She would like to reschedule.   CB [phone]

## 2020-10-12 NOTE — Telephone Encounter (Signed)
Scheduled for 8/8 at 1000.

## 2020-10-23 ENCOUNTER — Encounter: Payer: 59 | Admitting: Physical Medicine and Rehabilitation

## 2020-11-06 ENCOUNTER — Ambulatory Visit (INDEPENDENT_AMBULATORY_CARE_PROVIDER_SITE_OTHER): Payer: 59 | Admitting: Psychology

## 2020-11-06 DIAGNOSIS — F4323 Adjustment disorder with mixed anxiety and depressed mood: Secondary | ICD-10-CM

## 2020-11-07 ENCOUNTER — Other Ambulatory Visit: Payer: Self-pay | Admitting: Physical Medicine and Rehabilitation

## 2020-11-10 ENCOUNTER — Telehealth: Payer: Self-pay

## 2020-11-10 NOTE — Telephone Encounter (Signed)
Pt called and would like to set up an appt with Dr. Alvester Morin  Please advise

## 2020-11-17 ENCOUNTER — Telehealth: Payer: Self-pay | Admitting: Physical Medicine and Rehabilitation

## 2020-11-17 NOTE — Telephone Encounter (Signed)
Pt returned call to [facility]. Told pt to listen out for her phone several attempts was made to call. Please call pt at [phone].

## 2020-11-21 ENCOUNTER — Other Ambulatory Visit: Payer: Self-pay | Admitting: Family Medicine

## 2020-11-21 ENCOUNTER — Ambulatory Visit (INDEPENDENT_AMBULATORY_CARE_PROVIDER_SITE_OTHER): Payer: 59 | Admitting: Psychology

## 2020-11-21 DIAGNOSIS — F4323 Adjustment disorder with mixed anxiety and depressed mood: Secondary | ICD-10-CM | POA: Diagnosis not present

## 2020-11-21 DIAGNOSIS — G894 Chronic pain syndrome: Secondary | ICD-10-CM

## 2020-11-22 ENCOUNTER — Other Ambulatory Visit: Payer: Self-pay | Admitting: Physical Medicine and Rehabilitation

## 2020-11-22 DIAGNOSIS — M5416 Radiculopathy, lumbar region: Secondary | ICD-10-CM

## 2020-11-22 NOTE — Telephone Encounter (Signed)
Appt scheduled 12/01/20 @ 8:00 am.  Patient agreeable. Dm/cma

## 2020-12-01 ENCOUNTER — Ambulatory Visit (INDEPENDENT_AMBULATORY_CARE_PROVIDER_SITE_OTHER): Payer: 59 | Admitting: Family Medicine

## 2020-12-01 ENCOUNTER — Other Ambulatory Visit: Payer: Self-pay

## 2020-12-01 VITALS — BP 106/80 | HR 74 | Temp 97.4°F | Ht 65.0 in | Wt 154.4 lb

## 2020-12-01 DIAGNOSIS — N3281 Overactive bladder: Secondary | ICD-10-CM | POA: Insufficient documentation

## 2020-12-01 DIAGNOSIS — Z23 Encounter for immunization: Secondary | ICD-10-CM | POA: Diagnosis not present

## 2020-12-01 DIAGNOSIS — G894 Chronic pain syndrome: Secondary | ICD-10-CM | POA: Diagnosis not present

## 2020-12-01 DIAGNOSIS — M5137 Other intervertebral disc degeneration, lumbosacral region: Secondary | ICD-10-CM

## 2020-12-01 DIAGNOSIS — I10 Essential (primary) hypertension: Secondary | ICD-10-CM | POA: Diagnosis not present

## 2020-12-01 DIAGNOSIS — Z1231 Encounter for screening mammogram for malignant neoplasm of breast: Secondary | ICD-10-CM

## 2020-12-01 MED ORDER — SOLIFENACIN SUCCINATE 5 MG PO TABS
5.0000 mg | ORAL_TABLET | Freq: Every day | ORAL | 3 refills | Status: DC
Start: 2020-12-01 — End: 2021-02-27

## 2020-12-01 MED ORDER — TRAMADOL HCL 50 MG PO TABS: 50.0000 mg | ORAL_TABLET | Freq: Two times a day (BID) | ORAL | 0 refills | Status: DC
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
## 2020-12-04 ENCOUNTER — Encounter: Payer: Self-pay | Admitting: Physical Medicine and Rehabilitation

## 2020-12-04 ENCOUNTER — Other Ambulatory Visit: Payer: Self-pay

## 2020-12-04 ENCOUNTER — Ambulatory Visit (INDEPENDENT_AMBULATORY_CARE_PROVIDER_SITE_OTHER): Payer: 59 | Admitting: Physical Medicine and Rehabilitation

## 2020-12-04 VITALS — BP 126/73 | HR 68

## 2020-12-04 DIAGNOSIS — M47816 Spondylosis without myelopathy or radiculopathy, lumbar region: Secondary | ICD-10-CM

## 2020-12-04 DIAGNOSIS — M5441 Lumbago with sciatica, right side: Secondary | ICD-10-CM | POA: Diagnosis not present

## 2020-12-04 DIAGNOSIS — M5416 Radiculopathy, lumbar region: Secondary | ICD-10-CM | POA: Diagnosis not present

## 2020-12-04 DIAGNOSIS — S93509A Unspecified sprain of unspecified toe(s), initial encounter: Secondary | ICD-10-CM

## 2020-12-04 DIAGNOSIS — G8929 Other chronic pain: Secondary | ICD-10-CM

## 2020-12-04 DIAGNOSIS — G894 Chronic pain syndrome: Secondary | ICD-10-CM | POA: Diagnosis not present

## 2020-12-04 DIAGNOSIS — M5116 Intervertebral disc disorders with radiculopathy, lumbar region: Secondary | ICD-10-CM | POA: Diagnosis not present
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
## 2020-12-04 NOTE — Progress Notes (Signed)
Pt state lower back that travels to her right hip. Pt state bending over and keeping up items makes the pain worse. Pt state can bend over and get stuck and feel really bad pain. Pt state she takes pain meds to help ease her pain. Pt state she may have broke her left pinky toes this morning.  Numeric Pain Rating Scale and Functional Assessment Average Pain 10 Pain Right Now 6 My pain is intermittent, stabbing, tingling, and aching Pain is worse with: bending and some activites Pain improves with: medication   In the last MONTH (on 0-10 scale) has pain interfered with the following?  1. General activity like being  able to carry out your everyday physical activities such as walking, climbing stairs, carrying groceries, or moving a chair?  Rating(7)  2. Relation with others like being able to carry out your usual social activities and roles such as  activities at home, at work and in your community. Rating(8)  3. Enjoyment of life such that you have  been bothered by emotional problems such as feeling anxious, depressed or irritable?  Rating(9)

## 2020-12-05 ENCOUNTER — Other Ambulatory Visit: Payer: Self-pay | Admitting: Family Medicine

## 2020-12-05 DIAGNOSIS — G894 Chronic pain syndrome: Secondary | ICD-10-CM

## 2020-12-08 ENCOUNTER — Ambulatory Visit: Payer: 59 | Admitting: Psychology

## 2020-12-15 ENCOUNTER — Other Ambulatory Visit: Payer: Self-pay | Admitting: Family Medicine

## 2020-12-15 DIAGNOSIS — G894 Chronic pain syndrome: Secondary | ICD-10-CM

## 2020-12-22 ENCOUNTER — Ambulatory Visit (INDEPENDENT_AMBULATORY_CARE_PROVIDER_SITE_OTHER): Payer: 59 | Admitting: Psychology

## 2020-12-22 DIAGNOSIS — F4323 Adjustment disorder with mixed anxiety and depressed mood: Secondary | ICD-10-CM | POA: Diagnosis not present

## 2020-12-25 ENCOUNTER — Telehealth: Payer: Self-pay | Admitting: Physical Medicine and Rehabilitation

## 2020-12-25 NOTE — Telephone Encounter (Signed)
Pt calling back about injection with Dr.Newton.   CB [phone]

## 2020-12-29 ENCOUNTER — Emergency Department (HOSPITAL_BASED_OUTPATIENT_CLINIC_OR_DEPARTMENT_OTHER)
Admission: EM | Admit: 2020-12-29 | Discharge: 2020-12-29 | Disposition: A | Discharge status: Home / Self Care | Payer: Self-pay | Attending: Emergency Medicine | Admitting: Emergency Medicine

## 2020-12-29 ENCOUNTER — Encounter (HOSPITAL_BASED_OUTPATIENT_CLINIC_OR_DEPARTMENT_OTHER): Payer: Self-pay | Admitting: Emergency Medicine

## 2020-12-29 ENCOUNTER — Other Ambulatory Visit: Payer: Self-pay | Admitting: Family Medicine

## 2020-12-29 ENCOUNTER — Other Ambulatory Visit: Payer: Self-pay

## 2020-12-29 ENCOUNTER — Emergency Department (HOSPITAL_BASED_OUTPATIENT_CLINIC_OR_DEPARTMENT_OTHER): Payer: Self-pay

## 2020-12-29 DIAGNOSIS — Z7952 Long term (current) use of systemic steroids: Secondary | ICD-10-CM | POA: Insufficient documentation

## 2020-12-29 DIAGNOSIS — G894 Chronic pain syndrome: Secondary | ICD-10-CM

## 2020-12-29 DIAGNOSIS — F17210 Nicotine dependence, cigarettes, uncomplicated: Secondary | ICD-10-CM | POA: Insufficient documentation

## 2020-12-29 DIAGNOSIS — Z79899 Other long term (current) drug therapy: Secondary | ICD-10-CM | POA: Insufficient documentation

## 2020-12-29 DIAGNOSIS — J441 Chronic obstructive pulmonary disease with (acute) exacerbation: Secondary | ICD-10-CM | POA: Insufficient documentation

## 2020-12-29 DIAGNOSIS — I10 Essential (primary) hypertension: Secondary | ICD-10-CM | POA: Insufficient documentation

## 2020-12-29 DIAGNOSIS — R011 Cardiac murmur, unspecified: Secondary | ICD-10-CM | POA: Insufficient documentation

## 2020-12-29 DIAGNOSIS — M7918 Myalgia, other site: Secondary | ICD-10-CM | POA: Insufficient documentation

## 2020-12-29 DIAGNOSIS — Z20822 Contact with and (suspected) exposure to covid-19: Secondary | ICD-10-CM | POA: Insufficient documentation

## 2020-12-29 LAB — CBC
HCT: 41.6 % (ref 36.0–46.0)
Hemoglobin: 13.6 g/dL (ref 12.0–15.0)
MCH: 29.4 pg (ref 26.0–34.0)
MCHC: 32.7 g/dL (ref 30.0–36.0)
MCV: 89.8 fL (ref 80.0–100.0)
Platelets: 248 10*3/uL (ref 150–400)
RBC: 4.63 MIL/uL (ref 3.87–5.11)
RDW: 13.5 % (ref 11.5–15.5)
WBC: 6.3 10*3/uL (ref 4.0–10.5)
nRBC: 0 % (ref 0.0–0.2)

## 2020-12-29 LAB — BASIC METABOLIC PANEL
Anion gap: 7 (ref 5–15)
BUN: 11 mg/dL (ref 8–23)
CO2: 29 mmol/L (ref 22–32)
Calcium: 9 mg/dL (ref 8.9–10.3)
Chloride: 105 mmol/L (ref 98–111)
Creatinine, Ser: 0.88 mg/dL (ref 0.44–1.00)
GFR, Estimated: >60 mL/min (ref >60)
Glucose, Bld: 91 mg/dL (ref 70–99)
Potassium: 3.4 mmol/L — ABNORMAL LOW (ref 3.5–5.1)
Sodium: 141 mmol/L (ref 135–145)

## 2020-12-29 LAB — RESP PANEL BY RT-PCR (FLU A&B, COVID) ARPGX2
Influenza A by PCR: NEGATIVE
Influenza B by PCR: NEGATIVE
SARS Coronavirus 2 by RT PCR: NEGATIVE

## 2020-12-29 LAB — TROPONIN I (HIGH SENSITIVITY)
Troponin I (High Sensitivity): 7 ng/L (ref <18)
Troponin I (High Sensitivity): 6 ng/L (ref <18)

## 2020-12-29 MED ORDER — DOXYCYCLINE HYCLATE 100 MG PO CAPS: 100.0000 mg | ORAL_CAPSULE | Freq: Two times a day (BID) | ORAL | 0 refills | Status: DC

## 2020-12-29 NOTE — ED Notes (Signed)
First contact with patient. Patient arrived via triage from home with complaints of body aches, sore throat, congestion and feeling chills x Tuesday. Pt is A&OX 4. Respirations even/unlabored. Audible rhonchi when coughing. Patient changed into gown and placed on monitor and call light within reach. Second troponin collected and sent. Patient updated on plan of care. Will continue to monitor patient.

## 2020-12-29 NOTE — ED Notes (Signed)
ED Provider at bedside. 
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
## 2020-12-29 NOTE — Discharge Instructions (Addendum)
You were seen here today for evaluation of cough and cold symptoms.  Your lab results were negative for flu and COVID.  Your chest x-ray was negative for pneumonia.  Because of your history of COPD, this is more likely a COPD exacerbation.  You are prescribed doxycycline, an antibiotic, to help with your symptoms.  Please take as prescribed and complete the entire course.  Do not take this medication with dairy products.  I recommend that you follow-up with your primary care provider as you may need additional inhalers long-term for your COPD.  If you have any worsening chest pain, shortness of breath, fever, please return to the nearest emergency department.

## 2020-12-29 NOTE — ED Triage Notes (Addendum)
Pt presents to ED POV. Pt c/o generalized body aches, rhinorrhea, sore throat, tactile fever, cough since Tuesday.   Pt later c/o CP

## 2020-12-29 NOTE — Telephone Encounter (Signed)
Refill request for:  Tramadol 50 mg  LR 12/01/20, #20, 0  LOV 12/04/20 FOV 03/02/21  Needs #60 for 30days.    Please review and advise.  Thanks. Dm/cma

## 2021-01-04 ENCOUNTER — Other Ambulatory Visit: Payer: Self-pay

## 2021-01-04 ENCOUNTER — Ambulatory Visit (INDEPENDENT_AMBULATORY_CARE_PROVIDER_SITE_OTHER): Payer: 59 | Admitting: Physical Medicine and Rehabilitation

## 2021-01-04 ENCOUNTER — Encounter: Payer: Self-pay | Admitting: Physical Medicine and Rehabilitation

## 2021-01-04 ENCOUNTER — Ambulatory Visit: Payer: Self-pay

## 2021-01-04 VITALS — BP 132/84 | HR 91

## 2021-01-04 DIAGNOSIS — M5416 Radiculopathy, lumbar region: Secondary | ICD-10-CM

## 2021-01-04 MED ORDER — METHYLPREDNISOLONE ACETATE 80 MG/ML IJ SUSP
80.0000 mg | Freq: Once | INTRAMUSCULAR | Status: AC
  Administered 2021-01-04: 80 mg

## 2021-01-04 NOTE — Progress Notes (Signed)
Pt state lower back pain that travels down her right leg. Pt state laying down and standing makes the pain worse. Pt state she takes pain meds to help ease her pain.  Numeric Pain Rating Scale and Functional Assessment Average Pain 6   In the last MONTH (on 0-10 scale) has pain interfered with the following?  1. General activity like being  able to carry out your everyday physical activities such as walking, climbing stairs, carrying groceries, or moving a chair?  Rating(8)   +Driver, -BT, -Dye Allergies.

## 2021-01-04 NOTE — Patient Instructions (Signed)

## 2021-01-15 ENCOUNTER — Ambulatory Visit (INDEPENDENT_AMBULATORY_CARE_PROVIDER_SITE_OTHER): Payer: Self-pay | Admitting: Psychology

## 2021-01-15 DIAGNOSIS — F4323 Adjustment disorder with mixed anxiety and depressed mood: Secondary | ICD-10-CM

## 2021-01-16 NOTE — Procedures (Signed)
Lumbosacral Transforaminal Epidural Steroid Injection - Sub-Pedicular Approach with Fluoroscopic Guidance  [name]      Date of Birth: [birth year] MRN: [id number] PCP: Loyola Mast, MD      Visit Date: 01/04/2021   Universal Protocol:    Date/Time: 01/04/2021  Consent Given By: the patient  Position: PRONE  Additional Comments: Vital signs were monitored before and after the procedure. Patient was prepped and draped in the usual sterile fashion. The correct patient, procedure, and site was verified.   Injection Procedure Details:   Procedure diagnoses: Lumbar radiculopathy [M54.16]    Meds Administered:  Meds ordered this encounter  Medications   methylPREDNISolone acetate (DEPO-MEDROL) injection 80 mg    Laterality: Right  Location/Site: L5  Needle:5.0 in., 22 ga.  Short bevel or Quincke spinal needle  Needle Placement: Transforaminal  Findings:    -Comments: Excellent flow of contrast along the nerve, nerve root and into the epidural space.  Procedure Details: After squaring off the end-plates to get a true AP view, the C-arm was positioned so that an oblique view of the foramen as noted above was visualized. The target area is just inferior to the "nose of the scotty dog" or sub pedicular. The soft tissues overlying this structure were infiltrated with 2-3 ml. of 1% Lidocaine without Epinephrine.  The spinal needle was inserted toward the target using a "trajectory" view along the fluoroscope beam.  Under AP and lateral visualization, the needle was advanced so it did not puncture dura and was located close the 6 O'Clock position of the pedical in AP tracterory. Biplanar projections were used to confirm position. Aspiration was confirmed to be negative for CSF and/or blood. A 1-2 ml. volume of Isovue-250 was injected and flow of contrast was noted at each level. Radiographs were obtained for documentation purposes.   After attaining the desired flow of  contrast documented above, a 0.5 to 1.0 ml test dose of 0.25% Marcaine was injected into each respective transforaminal space.  The patient was observed for 90 seconds post injection.  After no sensory deficits were reported, and normal lower extremity motor function was noted,   the above injectate was administered so that equal amounts of the injectate were placed at each foramen (level) into the transforaminal epidural space.   Additional Comments:  No complications occurred Dressing: 2 x 2 sterile gauze and Band-Aid    Post-procedure details: Patient was observed during the procedure. Post-procedure instructions were reviewed.  Patient left the clinic in stable condition.

## 2021-01-16 NOTE — Progress Notes (Signed)
Susan Lewis - 60 y.o. female MRN [id number]  Date of birth: 03/13/1961  Office Visit Note: Visit Date: 01/04/2021 PCP: Loyola Mast, MD Referred by: Loyola Mast, MD  Subjective: Chief Complaint  Patient presents with   Lower Back - Pain   Right Leg - Pain   HPI:  Susan Lewis is a 60 y.o. female who comes in today at the request of Ellin Goodie, FNP for planned Right L5-S1 Lumbar Transforaminal epidural steroid injection with fluoroscopic guidance.  The patient has failed conservative care including home exercise, medications, time and activity modification.  This injection will be diagnostic and hopefully therapeutic.  Please see requesting physician notes for further details and justification.   ROS Otherwise per HPI.  Assessment & Plan: Visit Diagnoses:    ICD-10-CM   1. Lumbar radiculopathy  M54.16 XR C-ARM NO REPORT    Epidural Steroid injection    methylPREDNISolone acetate (DEPO-MEDROL) injection 80 mg      Plan: No additional findings.   Meds & Orders:  Meds ordered this encounter  Medications   methylPREDNISolone acetate (DEPO-MEDROL) injection 80 mg    Orders Placed This Encounter  Procedures   XR C-ARM NO REPORT   Epidural Steroid injection    Follow-up: Return if symptoms worsen or fail to improve.   Procedures: No procedures performed  Lumbosacral Transforaminal Epidural Steroid Injection - Sub-Pedicular Approach with Fluoroscopic Guidance  [name]      Date of Birth: 24-Jul-1960 MRN: [id number] PCP: Loyola Mast, MD      Visit Date: 01/04/2021   Universal Protocol:    Date/Time: 01/04/2021  Consent Given By: the patient  Position: PRONE  Additional Comments: Vital signs were monitored before and after the procedure. Patient was prepped and draped in the usual sterile fashion. The correct patient, procedure, and site was verified.   Injection Procedure Details:   Procedure diagnoses: Lumbar radiculopathy [M54.16]     Meds Administered:  Meds ordered this encounter  Medications   methylPREDNISolone acetate (DEPO-MEDROL) injection 80 mg    Laterality: Right  Location/Site: L5  Needle:5.0 in., 22 ga.  Short bevel or Quincke spinal needle  Needle Placement: Transforaminal  Findings:    -Comments: Excellent flow of contrast along the nerve, nerve root and into the epidural space.  Procedure Details: After squaring off the end-plates to get a true AP view, the C-arm was positioned so that an oblique view of the foramen as noted above was visualized. The target area is just inferior to the "nose of the scotty dog" or sub pedicular. The soft tissues overlying this structure were infiltrated with 2-3 ml. of 1% Lidocaine without Epinephrine.  The spinal needle was inserted toward the target using a "trajectory" view along the fluoroscope beam.  Under AP and lateral visualization, the needle was advanced so it did not puncture dura and was located close the 6 O'Clock position of the pedical in AP tracterory. Biplanar projections were used to confirm position. Aspiration was confirmed to be negative for CSF and/or blood. A 1-2 ml. volume of Isovue-250 was injected and flow of contrast was noted at each level. Radiographs were obtained for documentation purposes.   After attaining the desired flow of contrast documented above, a 0.5 to 1.0 ml test dose of 0.25% Marcaine was injected into each respective transforaminal space.  The patient was observed for 90 seconds post injection.  After no sensory deficits were reported, and normal lower extremity motor function was noted,  the above injectate was administered so that equal amounts of the injectate were placed at each foramen (level) into the transforaminal epidural space.   Additional Comments:  No complications occurred Dressing: 2 x 2 sterile gauze and Band-Aid    Post-procedure details: Patient was observed during the procedure. Post-procedure  instructions were reviewed.  Patient left the clinic in stable condition.     Clinical History: MRI LUMBAR SPINE WITHOUT CONTRAST   TECHNIQUE:  Multiplanar, multisequence MR imaging of the lumbar spine was  performed. No intravenous contrast was administered.   COMPARISON:  Prior radiograph from 10/22/2019.   FINDINGS:  Segmentation:  Standard.   Alignment: Physiologic with preservation of the normal lumbar  lordosis. No listhesis.   Vertebrae: Vertebral body height maintained without acute or chronic  fracture. Bone marrow signal intensity within normal limits. No  discrete or worrisome osseous lesions. No abnormal marrow edema.   Conus medullaris and cauda equina: Conus extends to the T12-L1  level. Conus and cauda equina appear normal.   Paraspinal and other soft tissues: Unremarkable.   Disc levels:   T10-11: Seen only on sagittal projection. Mild diffuse disc bulge.  No significant spinal stenosis.   T11-12: Disc desiccation without disc bulge.  No stenosis.   T12-L1: Disc desiccation with small central/left paracentral disc  protrusion. No significant stenosis.   L1-2: Diffuse disc bulge with disc desiccation, slightly asymmetric  to the left. Mild bilateral facet hypertrophy. No significant spinal  stenosis. Mild left foraminal narrowing. Right neural foramina  remains patent.   L2-3: Mild disc bulge with disc desiccation. Mild facet hypertrophy.  No significant spinal stenosis. Foramina remain patent.   L3-4: Mild diffuse disc bulge with disc desiccation. Mild facet and  ligament flavum hypertrophy. Borderline mild bilateral lateral  recess stenosis. Central canal remains patent. Mild bilateral L3  foraminal narrowing.   L4-5: Diffuse disc bulge with disc desiccation. Superimposed shallow  right foraminal to extraforaminal disc protrusion contacts the  exiting right L4 nerve root (series 7, image 24). Moderate facet and  ligament flavum hypertrophy.  Resultant mild canal with moderate  bilateral subarticular stenosis. Moderate right worse than left L4  foraminal narrowing.   L5-S1: Minimal disc bulge. Severe right with moderate left facet  arthrosis. No significant spinal stenosis. Mild right worse than  left foraminal narrowing.   IMPRESSION:  1. Shallow right foraminal to extraforaminal disc protrusion at  L4-5, contacting the exiting right L4 nerve root.  2. Disc bulging with facet hypertrophy at L4-5 with resultant  moderate bilateral subarticular stenosis, with moderate right worse  than left L4 foraminal narrowing.  3. Additional mild noncompressive disc bulging and facet hypertrophy  elsewhere within the lumbar spine as above. No other significant  spinal stenosis or evidence for neural impingement.    Electronically Signed    By: Rise Mu M.D.    On: 12/21/2019 04:38     Objective:  VS:  HT:    WT:   BMI:     BP:132/84  HR:91bpm  TEMP: ( )  RESP:  Physical Exam Vitals and nursing note reviewed.  Constitutional:      General: She is not in acute distress.    Appearance: Normal appearance. She is not ill-appearing.  HENT:     Head: Normocephalic and atraumatic.     Right Ear: External ear normal.     Left Ear: External ear normal.  Eyes:     Extraocular Movements: Extraocular movements intact.  Cardiovascular:  Rate and Rhythm: Normal rate.     Pulses: Normal pulses.  Pulmonary:     Effort: Pulmonary effort is normal. No respiratory distress.  Abdominal:     General: There is no distension.     Palpations: Abdomen is soft.  Musculoskeletal:        General: Tenderness present.     Cervical back: Neck supple.     Right lower leg: No edema.     Left lower leg: No edema.     Comments: Patient has good distal strength with no pain over the greater trochanters.  No clonus or focal weakness.  Skin:    Findings: No erythema, lesion or rash.  Neurological:     General: No focal deficit  present.     Mental Status: She is alert and oriented to person, place, and time.     Sensory: No sensory deficit.     Motor: No weakness or abnormal muscle tone.     Coordination: Coordination normal.  Psychiatric:        Mood and Affect: Mood normal.        Behavior: Behavior normal.     Imaging: No results found.

## 2021-01-25 ENCOUNTER — Other Ambulatory Visit: Payer: Self-pay | Admitting: Family Medicine

## 2021-01-25 DIAGNOSIS — G894 Chronic pain syndrome: Secondary | ICD-10-CM

## 2021-01-27 ENCOUNTER — Ambulatory Visit: Payer: Self-pay

## 2021-01-29 ENCOUNTER — Ambulatory Visit (INDEPENDENT_AMBULATORY_CARE_PROVIDER_SITE_OTHER): Payer: 59 | Admitting: Psychology

## 2021-01-29 DIAGNOSIS — F4323 Adjustment disorder with mixed anxiety and depressed mood: Secondary | ICD-10-CM

## 2021-02-21 ENCOUNTER — Other Ambulatory Visit: Payer: Self-pay

## 2021-02-21 ENCOUNTER — Telehealth: Payer: Self-pay

## 2021-02-21 NOTE — Telephone Encounter (Signed)
Lft VM to rtn call in regards to receving a refill request form [facility] for Amlodipine 5 mg LR HX provider  LOV 12/01/20 FOV 02/23/21  Wanted to know it she out or can she wait till her up coming appointment?  Dm/cma

## 2021-02-23 ENCOUNTER — Ambulatory Visit: Payer: Self-pay | Admitting: Family Medicine

## 2021-02-26 ENCOUNTER — Other Ambulatory Visit: Payer: Self-pay

## 2021-02-26 ENCOUNTER — Ambulatory Visit (INDEPENDENT_AMBULATORY_CARE_PROVIDER_SITE_OTHER): Payer: 59 | Admitting: Psychology

## 2021-02-26 DIAGNOSIS — F4323 Adjustment disorder with mixed anxiety and depressed mood: Secondary | ICD-10-CM | POA: Diagnosis not present

## 2021-02-26 NOTE — Progress Notes (Signed)
[facility] Counselor/Therapist Progress Note  Patient ID: Susan Lewis, MRN: [id number],    Date: 02/26/2021  Time Spent: 60 minutes   Treatment Type: Individual Therapy  Reported Symptoms: anxiety and overwhelm  Mental Status Exam: Appearance:  Casual     Behavior: Appropriate  Motor: Normal  Speech/Language:  Normal Rate  Affect: Appropriate  Mood: normal  Thought process: normal  Thought content:   WNL  Sensory/Perceptual disturbances:   WNL  Orientation: oriented to person, place, time/date, and situation  Attention: Good  Concentration: Good  Memory: WNL  Fund of knowledge:  Good  Insight:   Good  Judgment:  Good  Impulse Control: Good   Risk Assessment: Danger to Self:  No Self-injurious Behavior: No Danger to Others: No Duty to Warn:no Physical Aggression / Violence:No  Access to Firearms a concern: No  Gang Involvement:No   Subjective: Pt present for face-to-face individual therapy via video Webex.  Pt consents to telehealth video session due to COVID 19 pandemic. Location of pt: home  Location of therapist: home office.  Pt talked about feeling overwhelmed and anxious.  Addressed what triggers pt's anxiety.  Whenever she walks around and looks at everything that needs to be done in the house but she feels overwhelmed and feels like she does not have the energy to get things done as she gets older.   Worked with pt on adjusting her expectations of herself.   Worked on calming strategies.    Pt states she has a tendency to feel overwhelmed.  Pt tends to worry.  Worked with pt on worry management and thought reframing.   Worked on present moment mindfulness. Pt does not get much help from her husband and sons regarding doing household chores. Addressed how frustrating this is for pt.  Worked on how she can elicit their help and break down tasks into small achievable goals.   Pt talked about stress at work.  She feels she is scheduled for too many  hours.   She needs her days to be shorter so she is not in so much pain.  Addressed how pt can talk with her employer to get her needs met.  Pt is also thinking about quitting her job at [employer] bc of the stress and way it triggers her pain.   Provided supportive therapy.      Interventions: Cognitive Behavioral Therapy and Insight-Oriented  Diagnosis: F43.23  Plan: See pt's Treatment Plan for depression and anxiety in Therapy Charts.  (Treatment Plan Target Date: 11/06/2021) Pt is progressing with treatment goals.   Plan to continue to see pt every two weeks.     , LCSW    

## 2021-02-27 ENCOUNTER — Ambulatory Visit (INDEPENDENT_AMBULATORY_CARE_PROVIDER_SITE_OTHER): Payer: 59 | Admitting: Family Medicine

## 2021-02-27 ENCOUNTER — Encounter: Payer: Self-pay | Admitting: Family Medicine

## 2021-02-27 VITALS — BP 110/68 | HR 69 | Temp 98.4°F | Ht 65.0 in | Wt 144.0 lb

## 2021-02-27 DIAGNOSIS — G894 Chronic pain syndrome: Secondary | ICD-10-CM

## 2021-02-27 DIAGNOSIS — F419 Anxiety disorder, unspecified: Secondary | ICD-10-CM

## 2021-02-27 DIAGNOSIS — N3281 Overactive bladder: Secondary | ICD-10-CM | POA: Diagnosis not present

## 2021-02-27 DIAGNOSIS — F32A Depression, unspecified: Secondary | ICD-10-CM

## 2021-02-27 DIAGNOSIS — I10 Essential (primary) hypertension: Secondary | ICD-10-CM | POA: Diagnosis not present

## 2021-02-27 MED ORDER — SERTRALINE HCL 100 MG PO TABS: 200.0000 mg | ORAL_TABLET | Freq: Every day | ORAL | 3 refills | Status: DC

## 2021-02-27 MED ORDER — OXYBUTYNIN CHLORIDE 5 MG PO TABS: 5.0000 mg | ORAL_TABLET | Freq: Three times a day (TID) | ORAL | 2 refills | Status: DC
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
## 2021-03-02 ENCOUNTER — Ambulatory Visit: Payer: 59 | Admitting: Family Medicine

## 2021-03-19 ENCOUNTER — Ambulatory Visit (INDEPENDENT_AMBULATORY_CARE_PROVIDER_SITE_OTHER): Payer: Self-pay | Admitting: Psychology

## 2021-03-19 DIAGNOSIS — F4323 Adjustment disorder with mixed anxiety and depressed mood: Secondary | ICD-10-CM
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
## 2021-03-23 ENCOUNTER — Ambulatory Visit (INDEPENDENT_AMBULATORY_CARE_PROVIDER_SITE_OTHER): Payer: 59 | Admitting: Ophthalmology

## 2021-03-23 ENCOUNTER — Encounter (INDEPENDENT_AMBULATORY_CARE_PROVIDER_SITE_OTHER): Payer: Self-pay | Admitting: Ophthalmology

## 2021-03-23 ENCOUNTER — Other Ambulatory Visit: Payer: Self-pay

## 2021-03-23 DIAGNOSIS — H539 Unspecified visual disturbance: Secondary | ICD-10-CM

## 2021-03-23 DIAGNOSIS — G43109 Migraine with aura, not intractable, without status migrainosus: Secondary | ICD-10-CM | POA: Diagnosis not present

## 2021-03-23 DIAGNOSIS — I10 Essential (primary) hypertension: Secondary | ICD-10-CM

## 2021-03-23 DIAGNOSIS — H25813 Combined forms of age-related cataract, bilateral: Secondary | ICD-10-CM

## 2021-03-23 DIAGNOSIS — H35033 Hypertensive retinopathy, bilateral: Secondary | ICD-10-CM

## 2021-03-23 NOTE — Progress Notes (Signed)
[facility] Note  03/23/2021     CHIEF COMPLAINT Patient presents for Retina Evaluation   HISTORY OF PRESENT ILLNESS: Susan Lewis is a 61 y.o. female who presents to the clinic today for:   HPI     Retina Evaluation   In both eyes.  Associated Symptoms Flashes, Floaters and Pain.  I, the attending physician,  performed the HPI with the patient and updated documentation appropriately.        Comments   Patient here for Retina evaluation. Patient states vision not doing well. When closes eyes sees a geo like a piece of gem green with clouds. Has a lot of flashing and shadowing. Has discomfort. Wants to close eyes to help. Has cataracts OU. No eye drops.       Last edited by Bernarda Caffey, MD on 03/24/2021  2:15 AM.     Patient c/o flashes and geo-shapes when patient closes eyes. Symptoms for the past week. Denies nausea or vomiting. Denies previous history of migraines.     Referring physician: Haydee Salter, [place],  [place] [address]  HISTORICAL INFORMATION:   Selected notes from the MEDICAL RECORD NUMBER Referred by self LEE:  Ocular Hx- PMH-    CURRENT MEDICATIONS: No current outpatient medications on file. (Ophthalmic Drugs)   No current facility-administered medications for this visit. (Ophthalmic Drugs)   Current Outpatient Medications (Other)  Medication Sig   albuterol (VENTOLIN HFA) 108 (90 Base) MCG/ACT inhaler INHALE 2 PUFFS INTO THE LUNGS EVERY 6 HOURS AS NEEDED FOR UP TO 30 DAYS FOR WHEEZING   atorvastatin (LIPITOR) 20 MG tablet Take 1 tablet by mouth at bedtime.   busPIRone (BUSPAR) 15 MG tablet TAKE ONE TABLET BY MOUTH TWICE A DAY   diclofenac Sodium (VOLTAREN) 1 % GEL Apply small amount sparingly to the top of the right foot twice daily   fluticasone (FLONASE) 50 MCG/ACT nasal spray Place into the nose.   gabapentin (NEURONTIN) 300 MG capsule Take 1 capsule (300 mg total) by mouth 2 (two)  times daily.   meloxicam (MOBIC) 15 MG tablet Take 1 tablet by mouth daily.   omeprazole (PRILOSEC) 40 MG capsule Take 40 mg by mouth at bedtime.    oxybutynin (DITROPAN) 5 MG tablet Take 1 tablet (5 mg total) by mouth 3 (three) times daily.   sertraline (ZOLOFT) 100 MG tablet Take 2 tablets (200 mg total) by mouth daily.   traMADol (ULTRAM) 50 MG tablet TAKE ONE TABLET BY MOUTH TWICE A DAY   traZODone (DESYREL) 150 MG tablet Take 150 mg by mouth at bedtime.    amLODipine (NORVASC) 5 MG tablet Take 1 tablet by mouth daily. (Patient not taking: Reported on 03/23/2021)   nicotine (NICODERM CQ) 21 mg/24hr patch Place 1 patch (21 mg total) onto the skin daily. (Patient not taking: Reported on 03/23/2021)   No current facility-administered medications for this visit. (Other)      REVIEW OF SYSTEMS: ROS   Positive for: Eyes, Respiratory, Psychiatric Negative for: Constitutional, Gastrointestinal, Neurological, Skin, Genitourinary, Musculoskeletal, HENT, Endocrine, Cardiovascular, Allergic/Imm, Heme/Lymph Last edited by Kingsley Spittle, COT on 03/23/2021  3:05 PM.       ALLERGIES Allergies  Allergen Reactions   Morphine Other (See Comments)    Hallucination   Codeine Itching    PAST MEDICAL HISTORY Past Medical History:  Diagnosis Date   Anxiety    Arthritis    Bursitis    Carpal tunnel  syndrome    COPD (chronic obstructive pulmonary disease) (HCC)    Depression    Fibromyalgia    Fibromyalgia    GERD (gastroesophageal reflux disease)    Hemorrhoids 10/27/2014   Hyperlipidemia    Hypertension    Kidney stones    Past Surgical History:  Procedure Laterality Date   CARPAL TUNNEL RELEASE Right    carpel tunnel    CESAREAN SECTION     CHOLECYSTECTOMY     ENDOMETRIAL ABLATION     EXCISIONAL HEMORRHOIDECTOMY     OOPHORECTOMY Left    ORIF ANKLE FRACTURE BIMALLEOLAR      FAMILY HISTORY History reviewed. No pertinent family history.  SOCIAL HISTORY Social History    Tobacco Use   Smoking status: Every Day    Packs/day: 1.00    Years: 45.00    Pack years: 45.00    Types: Cigarettes    Start date: 08/28/1971   Smokeless tobacco: Never  Vaping Use   Vaping Use: Never used  Substance Use Topics   Alcohol use: No   Drug use: Yes    Types: Marijuana    Comment: Frequent, daily use         OPHTHALMIC EXAM:  Base Eye Exam     Visual Acuity (Snellen - Linear)       Right Left   Dist cc 20/25 -1 20/50 -1   Dist ph cc NI 20/40    Correction: Glasses         Tonometry (Tonopen, 2:53 PM)       Right Left   Pressure 10 07         Pupils       Dark Light Shape React APD   Right 4 3 Round Minimal None   Left 4 3 Round Minimal None         Visual Fields (Counting fingers)       Left Right    Full Full         Extraocular Movement       Right Left    Full Full         Neuro/Psych     Oriented x3: Yes   Mood/Affect: Normal         Dilation     Both eyes: 1.0% Mydriacyl, 2.5% Phenylephrine @ 2:53 PM           Slit Lamp and Fundus Exam     Slit Lamp Exam       Right Left   Lids/Lashes Dermatochalasis - lower lid Dermatochalasis - lower lid   Conjunctiva/Sclera temporal pinguecula nasal and temporal pinguecula   Cornea 1+ inf fine PEE 1+ inf fine PEE   Anterior Chamber deep and cear deep and cear   Iris round and dilated round and dilated   Lens 2+ NS, 2-3+ cortical 2-3+ NS, 2-3+ cortical   Anterior Vitreous syneresis syneresis         Fundus Exam       Right Left   Disc pink and sharp pink and sharp   C/D Ratio 0.2, nasal PPP 0.2   Macula flat, good foveal reflex, mild RPE mottling, no heme or edema flat, good foveal reflex, mild RPE mottling, no heme or edema   Vessels attenuation, mild tortuosity attenuation, mild tortuosity   Periphery attached, no heme attached, no heme           Refraction     Wearing Rx       Sphere Cylinder  Axis Add   Right +2.25 +0.25 035 +1.75   Left  +2.25 +0.25 116 +1.75         Manifest Refraction       Sphere Cylinder Axis Dist VA   Right +2.00 +0.25 035 20/30-2   Left +2.25 +0.25 115 NI            IMAGING AND PROCEDURES  Imaging and Procedures for 03/23/2021  OCT, Retina - OU - Both Eyes       Right Eye Quality was good. Central Foveal Thickness: 278. Progression has no prior data. Findings include no SRF, no IRF, normal foveal contour, vitreomacular adhesion .   Left Eye Quality was good. Central Foveal Thickness: 285. Progression has no prior data. Findings include normal foveal contour, no IRF, no SRF, vitreomacular adhesion .   Notes *Images captured and stored on drive  Diagnosis / Impression:  NFP; no IRF/SRF OU   Clinical management:  See below  Abbreviations: NFP - Normal foveal profile. CME - cystoid macular edema. PED - pigment epithelial detachment. IRF - intraretinal fluid. SRF - subretinal fluid. EZ - ellipsoid zone. ERM - epiretinal membrane. ORA - outer retinal atrophy. ORT - outer retinal tubulation. SRHM - subretinal hyper-reflective material. IRHM - intraretinal hyper-reflective material            ASSESSMENT/PLAN:    ICD-10-CM   1. 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 Essential hypertension  I10     4. Hypertensive retinopathy of both eyes  H35.033 OCT, Retina - OU - Both Eyes    5. Combined forms of age-related cataract of both eyes  H25.813       1,2. Visual Disturbance OU  - 1-2 wk history of color lights / objects in vision with some associated headaches  - BCVA 20/25 OD, 20/40 OS  - dilated exam without ocular finding that would explain symptoms of colored lights / objects  - suspect ocular migraine  - discussed ocular migraine, prognosis, and potential treatment options  - no retinal or ophthalmic interventions indicated or recommended   - monitor for now  - return 4-6 weeks, may refer to neurology / headache specialist if symptoms  worsen  3,4. Hypertensive retinopathy OU - discussed importance of tight BP control - monitor  5. Mixed Cataracts OU (OS > OD)   - The symptoms of cataract, surgical options, and treatments and risks were discussed with patient. - discussed diagnosis and progression - OS probably visually significant - will refer to cataract surgeon at next visit, unless symptoms of migraines worsen   Ophthalmic Meds Ordered this visit:  No orders of the defined types were placed in this encounter.    Return 4-6 weeks, for DFE, OCT.  There are no Patient Instructions on file for this visit.   Explained the diagnoses, plan, and follow up with the patient and they expressed understanding.  Patient expressed understanding of the importance of proper follow up care.   This document serves as a record of services personally performed by Gardiner Sleeper, MD, PhD. It was created on their behalf by Roselee Nova, COMT. The creation of this record is the provider's dictation and/or activities during the visit.  Electronically signed by: Roselee Nova, COMT 03/24/21 2:24 AM   Gardiner Sleeper, M.D., Ph.D. Diseases & Surgery of the Retina and Vitreous [facility]  I have reviewed the above documentation for accuracy  and completeness, and I agree with the above. Gardiner Sleeper, M.D., Ph.D. 03/24/21 2:24 AM   Abbreviations: M myopia (nearsighted); A astigmatism; H hyperopia (farsighted); P presbyopia; Mrx spectacle prescription;  CTL contact lenses; OD right eye; OS left eye; OU both eyes  XT exotropia; ET esotropia; PEK punctate epithelial keratitis; PEE punctate epithelial erosions; DES dry eye syndrome; MGD meibomian gland dysfunction; ATs artificial tears; PFAT's preservative free artificial tears; [place] nuclear sclerotic cataract; PSC posterior subcapsular cataract; ERM epi-retinal membrane; PVD posterior vitreous detachment; RD retinal detachment; DM diabetes mellitus; DR diabetic  retinopathy; NPDR non-proliferative diabetic retinopathy; PDR proliferative diabetic retinopathy; CSME clinically significant macular edema; DME diabetic macular edema; dbh dot blot hemorrhages; CWS cotton wool spot; POAG primary open angle glaucoma; C/D cup-to-disc ratio; HVF humphrey visual field; GVF goldmann visual field; OCT optical coherence tomography; IOP intraocular pressure; BRVO Branch retinal vein occlusion; CRVO central retinal vein occlusion; CRAO central retinal artery occlusion; BRAO branch retinal artery occlusion; RT retinal tear; SB scleral buckle; PPV pars plana vitrectomy; VH Vitreous hemorrhage; PRP panretinal laser photocoagulation; IVK intravitreal kenalog; VMT vitreomacular traction; MH Macular hole;  NVD neovascularization of the disc; NVE neovascularization elsewhere; AREDS age related eye disease study; ARMD age related macular degeneration; POAG primary open angle glaucoma; EBMD epithelial/anterior basement membrane dystrophy; ACIOL anterior chamber intraocular lens; IOL intraocular lens; PCIOL posterior chamber intraocular lens; Phaco/IOL phacoemulsification with intraocular lens placement; [place] photorefractive keratectomy; LASIK laser assisted in situ keratomileusis; HTN hypertension; DM diabetes mellitus; COPD chronic obstructive pulmonary disease

## 2021-03-24 ENCOUNTER — Encounter (INDEPENDENT_AMBULATORY_CARE_PROVIDER_SITE_OTHER): Payer: Self-pay | Admitting: Ophthalmology

## 2021-04-02 ENCOUNTER — Ambulatory Visit (INDEPENDENT_AMBULATORY_CARE_PROVIDER_SITE_OTHER): Payer: 59 | Admitting: Psychology

## 2021-04-02 DIAGNOSIS — F4323 Adjustment disorder with mixed anxiety and depressed mood: Secondary | ICD-10-CM | POA: Diagnosis not present

## 2021-04-02 NOTE — Progress Notes (Signed)
[facility] Counselor/Therapist Progress Note  Patient ID: Susan Lewis, MRN: [id number],    Date: 04/02/2021  Time Spent: 10:00am-10:45am   45 minutes   Treatment Type: Individual Therapy  Reported Symptoms: stress  Mental Status Exam: Appearance:  Casual     Behavior: Appropriate  Motor: Normal  Speech/Language:  Normal Rate  Affect: Appropriate  Mood: normal  Thought process: normal  Thought content:   WNL  Sensory/Perceptual disturbances:   WNL  Orientation: oriented to person, place, time/date, and situation  Attention: Good  Concentration: Good  Memory: WNL  Fund of knowledge:  Good  Insight:   Good  Judgment:  Good  Impulse Control: Good   Risk Assessment: Danger to Self:  No Self-injurious Behavior: No Danger to Others: No Duty to Warn:no Physical Aggression / Violence:No  Access to Firearms a concern: No  Gang Involvement:No   Subjective: Pt present for face-to-face individual therapy via video Webex.  Pt consents to telehealth video session due to COVID 19 pandemic. Location of pt: home  Location of therapist: home office.  Pt talked about work.   She is not working now bc of issues with her eyes.  Pt has been out of work for over a week.  She has an appointment with her eye doctor but it isn't until February.   Pt is worried about not working and is worried about finances.  Addressed pt's worries and worked on thought reframing.   Pt has been cleaning her house since she has not been working.  She is making progress with her cleaning which feels encouraging to her.   Pt states her mood has been positive.  She is experiencing good results from the increase in Zoloft.      Provided supportive therapy.      Interventions: Cognitive Behavioral Therapy and Insight-Oriented  Diagnosis: F43.23  Plan: See pt's Treatment Plan for depression and anxiety in Therapy Charts.  (Treatment Plan Target Date: 11/06/2021) Pt is progressing toward treatment  goals.   Plan to continue to see pt every two weeks.    Susan Margulies, LCSW                  Susan Cumpian York, LCSW

## 2021-04-16 ENCOUNTER — Ambulatory Visit (INDEPENDENT_AMBULATORY_CARE_PROVIDER_SITE_OTHER): Payer: 59 | Admitting: Psychology

## 2021-04-16 ENCOUNTER — Ambulatory Visit: Payer: 59 | Admitting: Family Medicine

## 2021-04-16 DIAGNOSIS — F4323 Adjustment disorder with mixed anxiety and depressed mood: Secondary | ICD-10-CM

## 2021-04-16 NOTE — Progress Notes (Signed)
[facility] Counselor/Therapist Progress Note  Patient ID: Susan Lewis, MRN: [id number],    Date: 04/16/2021  Time Spent: 3:00pm-3:50pm   50 minutes   Treatment Type: Individual Therapy  Reported Symptoms: stress, tearfulness, worry  Mental Status Exam: Appearance:  Casual     Behavior: Appropriate  Motor: Normal  Speech/Language:  Normal Rate  Affect: Appropriate  Mood: normal  Thought process: normal  Thought content:   WNL  Sensory/Perceptual disturbances:   WNL  Orientation: oriented to person, place, time/date, and situation  Attention: Good  Concentration: Good  Memory: WNL  Fund of knowledge:  Good  Insight:   Good  Judgment:  Good  Impulse Control: Good   Risk Assessment: Danger to Self:  No Self-injurious Behavior: No Danger to Others: No Duty to Warn:no Physical Aggression / Violence:No  Access to Firearms a concern: No  Gang Involvement:No   Subjective: Pt present for face-to-face individual therapy via video Webex.  Pt consents to telehealth video session due to COVID 19 pandemic. Location of pt: home  Location of therapist: home office.  Pt talked about feeling very stressed bc she is not working much at [employer] and they don't have enough money.   Pt has had to ask for financial help from her sister who gave her $20 but that is not enough.   Pt was tearful as she talked about being so worried.   Pt also does not like to aske people for help.   Pt's sister gives pt a hard time for needing help.  Helped pt process her feelings and relationship dynamics.   Pt said her microwave died and her cooktop broke.  Pt's husband will get paid on Friday but they have to make it until then.  Helped pt problem solve how to access resources for food.  Pt will go to a food bank at a church.  Pt doesn't have enough money for her medications.   Addressed pt's worries and worked on thought reframing.   Pt talked about her relationship with her husband and son.    Her son's big dog tried to fight pt's dog.   This son is trying to find a place to move to but pt feels sad about him leaving.   Pt is worried that her son's dog will hurt her dog or her.      Provided supportive therapy.      Interventions: Cognitive Behavioral Therapy and Insight-Oriented  Diagnosis: F43.23  Plan: See pt's Treatment Plan for depression and anxiety in Therapy Charts.  (Treatment Plan Target Date: 11/06/2021) Pt is progressing toward treatment goals.   Plan to continue to see pt every two weeks.    Melbert Botelho, LCSW

## 2021-04-18 NOTE — Progress Notes (Signed)
[facility] Note  04/20/2021     CHIEF COMPLAINT Patient presents for Retina Follow Up   HISTORY OF PRESENT ILLNESS: Susan Lewis is a 61 y.o. female who presents to the clinic today for:   HPI     Retina Follow Up   Patient presents with  Other.  In left eye.  Since onset it is gradually worsening.  I, the attending physician,  performed the HPI with the patient and updated documentation appropriately.        Comments   Patient is having vertigo.  She thinks this is coming from her eye.  If she looks to the left or turns her head she will have a flash in her eye.  Once this happens she will see all these colors and a shape (not always the same shape) will block her vision is that eye.  This was happening at her last visit.  Much worse now.  She stumbles a lot now because of this.  OS is always sensitive to light.  She has not seen her PCP because she thinks it is all coming from her eye.  She has been out of work for a couple of months because of this.       Last edited by Bernarda Caffey, MD on 04/24/2021 12:29 AM.    Patient states her symptoms have not gotten any better since she was here last, she states she now has vertigo, she states if she turns and her eye catches the light a certain way, she starts seeing patterns in her vision and once that happens she is unable to see anything    Referring physician: Haydee Salter, MD [place],  [place] [id number]  HISTORICAL INFORMATION:   Selected notes from the [place] Referred by self LEE:  Ocular Hx- PMH-    CURRENT MEDICATIONS: No current outpatient medications on file. (Ophthalmic Drugs)   No current facility-administered medications for this visit. (Ophthalmic Drugs)   Current Outpatient Medications (Other)  Medication Sig   albuterol (VENTOLIN HFA) 108 (90 Base) MCG/ACT inhaler INHALE 2 PUFFS INTO THE LUNGS EVERY 6 HOURS AS NEEDED FOR UP TO 30 DAYS FOR  WHEEZING   atorvastatin (LIPITOR) 20 MG tablet Take 1 tablet by mouth at bedtime.   busPIRone (BUSPAR) 15 MG tablet TAKE ONE TABLET BY MOUTH TWICE A DAY   diclofenac Sodium (VOLTAREN) 1 % GEL Apply small amount sparingly to the top of the right foot twice daily   gabapentin (NEURONTIN) 300 MG capsule Take 1 capsule (300 mg total) by mouth 2 (two) times daily.   meloxicam (MOBIC) 15 MG tablet Take 1 tablet by mouth daily.   omeprazole (PRILOSEC) 40 MG capsule Take 40 mg by mouth at bedtime.    oxybutynin (DITROPAN) 5 MG tablet Take 1 tablet (5 mg total) by mouth 3 (three) times daily.   sertraline (ZOLOFT) 100 MG tablet Take 2 tablets (200 mg total) by mouth daily.   traMADol (ULTRAM) 50 MG tablet TAKE ONE TABLET BY MOUTH TWICE A DAY   traZODone (DESYREL) 150 MG tablet Take 150 mg by mouth at bedtime.    fluticasone (FLONASE) 50 MCG/ACT nasal spray Place 1 spray into both nostrils daily.   No current facility-administered medications for this visit. (Other)   REVIEW OF SYSTEMS: ROS   Positive for: Gastrointestinal, Musculoskeletal, Eyes, Respiratory, Psychiatric Negative for: Constitutional, Neurological, Skin, Genitourinary, HENT, Endocrine, Cardiovascular, Allergic/Imm, Heme/Lymph Last edited by Nyra Capes,  Shirlean Mylar, COA on 04/20/2021  2:20 PM.     ALLERGIES Allergies  Allergen Reactions   Morphine Other (See Comments)    Hallucination   Codeine Itching    PAST MEDICAL HISTORY Past Medical History:  Diagnosis Date   Anxiety    Arthritis    Bursitis    Carpal tunnel syndrome    COPD (chronic obstructive pulmonary disease) (HCC)    Depression    Fibromyalgia    Fibromyalgia    GERD (gastroesophageal reflux disease)    Hemorrhoids 10/27/2014   Hyperlipidemia    Hypertension    Kidney stones    Past Surgical History:  Procedure Laterality Date   CARPAL TUNNEL RELEASE Right    carpel tunnel    CESAREAN SECTION     CHOLECYSTECTOMY     ENDOMETRIAL ABLATION     EXCISIONAL  HEMORRHOIDECTOMY     OOPHORECTOMY Left    ORIF ANKLE FRACTURE BIMALLEOLAR     FAMILY HISTORY History reviewed. No pertinent family history.  SOCIAL HISTORY Social History   Tobacco Use   Smoking status: Every Day    Packs/day: 1.00    Years: 45.00    Pack years: 45.00    Types: Cigarettes    Start date: 08/28/1971   Smokeless tobacco: Never  Vaping Use   Vaping Use: Never used  Substance Use Topics   Alcohol use: No   Drug use: Yes    Types: Marijuana    Comment: Frequent, daily use       OPHTHALMIC EXAM:  Base Eye Exam     Visual Acuity (Snellen - Linear)       Right Left   Dist cc 20/30 20/40   Dist ph cc 20/30 +2 NI    Correction: Glasses         Tonometry (Tonopen, 2:30 PM)       Right Left   Pressure 6 6         Pupils       Dark Light Shape React APD   Right 4 3 Round Minimal None   Left 4 3 Round Minimal None         Visual Fields (Counting fingers)       Left Right    Full Full         Extraocular Movement       Right Left    Full Full         Neuro/Psych     Oriented x3: Yes   Mood/Affect: Normal         Dilation     Both eyes: 1.0% Mydriacyl, 2.5% Phenylephrine @ 2:30 PM           Slit Lamp and Fundus Exam     Slit Lamp Exam       Right Left   Lids/Lashes Dermatochalasis - lower lid Dermatochalasis - lower lid   Conjunctiva/Sclera temporal pinguecula nasal and temporal pinguecula   Cornea Trace PEE 1+ inf fine PEE   Anterior Chamber deep and cear deep and cear   Iris round and dilated round and dilated   Lens 2+ NS, 2-3+ cortical 2-3+ NS, 2-3+ cortical   Anterior Vitreous syneresis syneresis         Fundus Exam       Right Left   Disc pink and sharp, nasal PPP pink and sharp   C/D Ratio 0.2 0.2   Macula flat, good foveal reflex, mild RPE mottling, no heme or edema flat, good  foveal reflex, mild RPE mottling, no heme or edema   Vessels attenuation, mild tortuosity attenuation, mild tortuosity    Periphery attached, no heme attached, no heme           Refraction     Wearing Rx       Sphere Cylinder Axis Add   Right +2.25 +0.25 035 +1.75   Left +2.25 +0.25 116 +1.75            IMAGING AND PROCEDURES  Imaging and Procedures for 04/20/2021  OCT, Retina - OU - Both Eyes       Right Eye Quality was good. Central Foveal Thickness: 276. Progression has been stable. Findings include no SRF, no IRF, normal foveal contour, vitreomacular adhesion .   Left Eye Quality was good. Central Foveal Thickness: 292. Progression has been stable. Findings include normal foveal contour, no IRF, no SRF, vitreomacular adhesion .   Notes *Images captured and stored on drive  Diagnosis / Impression:  NFP; no IRF/SRF OU   Clinical management:  See below  Abbreviations: NFP - Normal foveal profile. CME - cystoid macular edema. PED - pigment epithelial detachment. IRF - intraretinal fluid. SRF - subretinal fluid. EZ - ellipsoid zone. ERM - epiretinal membrane. ORA - outer retinal atrophy. ORT - outer retinal tubulation. SRHM - subretinal hyper-reflective material. IRHM - intraretinal hyper-reflective material            ASSESSMENT/PLAN:    ICD-10-CM   1. 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 Essential hypertension  I10     4. Hypertensive retinopathy of both eyes  H35.033 OCT, Retina - OU - Both Eyes    5. Combined forms of age-related cataract of both eyes  H25.813        1,2. Visual Disturbance OU  - 2 month history of color lights / objects in vision with some associated headaches -- now also with vertigo  - BCVA 20/30 OD, 20/40 OS  - dilated exam without ocular finding that would explain symptoms of colored lights / objects  - suspect ocular migraine  - discussed ocular migraine, prognosis, and potential treatment options  - no retinal or ophthalmic interventions indicated or recommended   - will reach out to PCP to coordinate referral  to neurology / headache specialist  3,4. Hypertensive retinopathy OU - discussed importance of tight BP control - monitor   5. Mixed Cataracts OU (OS > OD)   - The symptoms of cataract, surgical options, and treatments and risks were discussed with patient. - discussed diagnosis and progression - OS approaching visual significance - will refer to Dr. Lucianne Lei for consult   Ophthalmic Meds Ordered this visit:  No orders of the defined types were placed in this encounter.    Return if symptoms worsen or fail to improve.  There are no Patient Instructions on file for this visit.   Explained the diagnoses, plan, and follow up with the patient and they expressed understanding.  Patient expressed understanding of the importance of proper follow up care.  This document serves as a record of services personally performed by Gardiner Sleeper, MD, PhD. It was created on their behalf by Leonie Douglas, an ophthalmic technician. The creation of this record is the provider's dictation and/or activities during the visit.    Electronically signed by: Leonie Douglas COA, 04/24/21  12:39 AM  This document serves as a record of services personally performed by Aaron Edelman  Antony Haste, MD, PhD. It was created on their behalf by San Jetty. Owens Shark, OA an ophthalmic technician. The creation of this record is the provider's dictation and/or activities during the visit.    Electronically signed by: San Jetty. Marguerita Merles 02.03.2023 12:39 AM  Gardiner Sleeper, M.D., Ph.D. Diseases & Surgery of the Retina and Vitreous [facility]  I have reviewed the above documentation for accuracy and completeness, and I agree with the above. Gardiner Sleeper, M.D., Ph.D. 04/24/21 12:43 AM   Abbreviations: M myopia (nearsighted); A astigmatism; H hyperopia (farsighted); P presbyopia; Mrx spectacle prescription;  CTL contact lenses; OD right eye; OS left eye; OU both eyes  XT exotropia; ET esotropia; PEK punctate  epithelial keratitis; PEE punctate epithelial erosions; DES dry eye syndrome; MGD meibomian gland dysfunction; ATs artificial tears; PFAT's preservative free artificial tears; [place] nuclear sclerotic cataract; PSC posterior subcapsular cataract; ERM epi-retinal membrane; PVD posterior vitreous detachment; RD retinal detachment; DM diabetes mellitus; DR diabetic retinopathy; NPDR non-proliferative diabetic retinopathy; PDR proliferative diabetic retinopathy; CSME clinically significant macular edema; DME diabetic macular edema; dbh dot blot hemorrhages; CWS cotton wool spot; POAG primary open angle glaucoma; C/D cup-to-disc ratio; HVF humphrey visual field; GVF goldmann visual field; OCT optical coherence tomography; IOP intraocular pressure; BRVO Branch retinal vein occlusion; CRVO central retinal vein occlusion; CRAO central retinal artery occlusion; BRAO branch retinal artery occlusion; RT retinal tear; SB scleral buckle; PPV pars plana vitrectomy; VH Vitreous hemorrhage; PRP panretinal laser photocoagulation; IVK intravitreal kenalog; VMT vitreomacular traction; MH Macular hole;  NVD neovascularization of the disc; NVE neovascularization elsewhere; AREDS age related eye disease study; ARMD age related macular degeneration; POAG primary open angle glaucoma; EBMD epithelial/anterior basement membrane dystrophy; ACIOL anterior chamber intraocular lens; IOL intraocular lens; PCIOL posterior chamber intraocular lens; Phaco/IOL phacoemulsification with intraocular lens placement; [place] photorefractive keratectomy; LASIK laser assisted in situ keratomileusis; HTN hypertension; DM diabetes mellitus; COPD chronic obstructive pulmonary disease

## 2021-04-20 ENCOUNTER — Encounter (INDEPENDENT_AMBULATORY_CARE_PROVIDER_SITE_OTHER): Payer: Self-pay | Admitting: Ophthalmology

## 2021-04-20 ENCOUNTER — Other Ambulatory Visit: Payer: Self-pay

## 2021-04-20 ENCOUNTER — Ambulatory Visit (INDEPENDENT_AMBULATORY_CARE_PROVIDER_SITE_OTHER): Payer: 59 | Admitting: Ophthalmology

## 2021-04-20 DIAGNOSIS — H539 Unspecified visual disturbance: Secondary | ICD-10-CM

## 2021-04-20 DIAGNOSIS — G43109 Migraine with aura, not intractable, without status migrainosus: Secondary | ICD-10-CM

## 2021-04-20 DIAGNOSIS — H25813 Combined forms of age-related cataract, bilateral: Secondary | ICD-10-CM

## 2021-04-20 DIAGNOSIS — I10 Essential (primary) hypertension: Secondary | ICD-10-CM

## 2021-04-20 DIAGNOSIS — H35033 Hypertensive retinopathy, bilateral: Secondary | ICD-10-CM | POA: Diagnosis not present

## 2021-04-23 ENCOUNTER — Ambulatory Visit (INDEPENDENT_AMBULATORY_CARE_PROVIDER_SITE_OTHER): Payer: 59 | Admitting: Family Medicine

## 2021-04-23 ENCOUNTER — Other Ambulatory Visit: Payer: Self-pay

## 2021-04-23 VITALS — BP 118/76 | HR 64 | Temp 97.5°F | Ht 65.0 in | Wt 142.6 lb

## 2021-04-23 DIAGNOSIS — H269 Unspecified cataract: Secondary | ICD-10-CM | POA: Insufficient documentation

## 2021-04-23 DIAGNOSIS — J301 Allergic rhinitis due to pollen: Secondary | ICD-10-CM | POA: Diagnosis not present

## 2021-04-23 DIAGNOSIS — N3281 Overactive bladder: Secondary | ICD-10-CM | POA: Diagnosis not present

## 2021-04-23 DIAGNOSIS — F339 Major depressive disorder, recurrent, unspecified: Secondary | ICD-10-CM

## 2021-04-23 DIAGNOSIS — H539 Unspecified visual disturbance: Secondary | ICD-10-CM

## 2021-04-23 DIAGNOSIS — H35033 Hypertensive retinopathy, bilateral: Secondary | ICD-10-CM

## 2021-04-23 DIAGNOSIS — H35039 Hypertensive retinopathy, unspecified eye: Secondary | ICD-10-CM | POA: Insufficient documentation

## 2021-04-23 DIAGNOSIS — J449 Chronic obstructive pulmonary disease, unspecified: Secondary | ICD-10-CM | POA: Diagnosis not present

## 2021-04-23 DIAGNOSIS — J309 Allergic rhinitis, unspecified: Secondary | ICD-10-CM | POA: Insufficient documentation

## 2021-04-23 DIAGNOSIS — H259 Unspecified age-related cataract: Secondary | ICD-10-CM

## 2021-04-23 DIAGNOSIS — I10 Essential (primary) hypertension: Secondary | ICD-10-CM

## 2021-04-23 MED ORDER — FLUTICASONE PROPIONATE 50 MCG/ACT NA SUSP: 1.0000 | Freq: Every day | NASAL | 5 refills | Status: AC
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
## 2021-04-24 ENCOUNTER — Encounter: Payer: Self-pay | Admitting: Neurology

## 2021-04-24 ENCOUNTER — Encounter (INDEPENDENT_AMBULATORY_CARE_PROVIDER_SITE_OTHER): Payer: Self-pay | Admitting: Ophthalmology

## 2021-04-24 NOTE — Addendum Note (Signed)
Addended by: Loyola Mast on: 04/24/2021 08:43 AM   Modules accepted: Orders

## 2021-05-04 ENCOUNTER — Other Ambulatory Visit: Payer: Self-pay | Admitting: Family Medicine

## 2021-05-04 DIAGNOSIS — F419 Anxiety disorder, unspecified: Secondary | ICD-10-CM

## 2021-05-07 ENCOUNTER — Ambulatory Visit (INDEPENDENT_AMBULATORY_CARE_PROVIDER_SITE_OTHER): Payer: 59 | Admitting: Psychology

## 2021-05-07 DIAGNOSIS — F4323 Adjustment disorder with mixed anxiety and depressed mood: Secondary | ICD-10-CM

## 2021-05-07 NOTE — Progress Notes (Signed)
[facility] Counselor/Therapist Progress Note  Patient ID: Susan Lewis, MRN: [id number],    Date: 05/07/2021  Time Spent: 11:00am-11:50am   50 minutes   Treatment Type: Individual Therapy  Reported Symptoms: stress, worry  Mental Status Exam: Appearance:  Casual     Behavior: Appropriate  Motor: Normal  Speech/Language:  Normal Rate  Affect: Appropriate  Mood: normal  Thought process: normal  Thought content:   WNL  Sensory/Perceptual disturbances:   WNL  Orientation: oriented to person, place, time/date, and situation  Attention: Good  Concentration: Good  Memory: WNL  Fund of knowledge:  Good  Insight:   Good  Judgment:  Good  Impulse Control: Good   Risk Assessment: Danger to Self:  No Self-injurious Behavior: No Danger to Others: No Duty to Warn:no Physical Aggression / Violence:No  Access to Firearms a concern: No  Gang Involvement:No   Subjective: Pt present for face-to-face individual therapy via video Webex.  Pt consents to telehealth video session due to COVID 19 pandemic. Location of pt: home  Location of therapist: home office.  Pt talked about her financial worries.   Her husband and sons have gotten their paychecks but money is still tight.  Pt has been resourceful and accessed food banks in the area.  Pt is also planning to sell some crafts she has made.  She still worries about money. Addressed pt's worries and worked on thought reframing.   Pt talked about her sons.  Pt states one of her sons is very selfish and does not help them out even when he makes good money.   The other son gives pt half his paycheck.  Both sons live with pt.   Addressed the stress of having little help from her sons.       Provided supportive therapy.      Interventions: Cognitive Behavioral Therapy and Insight-Oriented  Diagnosis: F43.23  Plan: See pt's Treatment Plan for depression and anxiety in Therapy Charts.  (Treatment Plan Target Date: 11/06/2021) Pt  is progressing toward treatment goals.   Plan to continue to see pt every two weeks.    Nafisa Olds, LCSW

## 2021-05-14 ENCOUNTER — Other Ambulatory Visit: Payer: Self-pay | Admitting: Family Medicine

## 2021-05-14 DIAGNOSIS — M797 Fibromyalgia: Secondary | ICD-10-CM

## 2021-05-14 DIAGNOSIS — G894 Chronic pain syndrome: Secondary | ICD-10-CM

## 2021-05-15 NOTE — Telephone Encounter (Signed)
Refill request for: Tramadol 50 mg LR  01/25/21.#60, 2 rf  Gabapentin 300 mg LR 08/07/20, 180, 3 rf LOV 04/23/21 FOV  07/23/21  Please review and advise.  Thanks  Dm/cma

## 2021-05-21 ENCOUNTER — Ambulatory Visit (INDEPENDENT_AMBULATORY_CARE_PROVIDER_SITE_OTHER): Payer: 59 | Admitting: Psychology

## 2021-05-21 DIAGNOSIS — F4323 Adjustment disorder with mixed anxiety and depressed mood: Secondary | ICD-10-CM | POA: Diagnosis not present

## 2021-05-21 NOTE — Progress Notes (Signed)
[facility] Counselor/Therapist Progress Note ? ?Patient ID: Susan Lewis, MRN: [id number],   ? ?Date: 05/21/2021 ? ?Time Spent: 11:00am-11:50am   50 minutes  ? ?Treatment Type: Individual Therapy ? ?Reported Symptoms: stress, worry ? ?Mental Status Exam: ?Appearance:  Casual     ?Behavior: Appropriate  ?Motor: Normal  ?Speech/Language:  Normal Rate  ?Affect: Appropriate  ?Mood: normal  ?Thought process: normal  ?Thought content:   WNL  ?Sensory/Perceptual disturbances:   WNL  ?Orientation: oriented to person, place, time/date, and situation  ?Attention: Good  ?Concentration: Good  ?Memory: WNL  ?Fund of knowledge:  Good  ?Insight:   Good  ?Judgment:  Good  ?Impulse Control: Good  ? ?Risk Assessment: ?Danger to Self:  No ?Self-injurious Behavior: No ?Danger to Others: No ?Duty to Warn:no ?Physical Aggression / Violence:No  ?Access to Firearms a concern: No  ?Gang Involvement:No  ? ?Subjective: Pt present for face-to-face individual therapy via video Webex.  Pt consents to telehealth video session due to COVID 19 pandemic. ?Location of pt: home  ?Location of therapist: home office.  ?Pt talked about work.   She worked at [employer] for 6 hours yesterday so she is in pain today.  Pt is working about 15 hours a week at [employer]. 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   This has helped pt's finances.   ?Pt still worries about her finances.      ?Addressed pt's worries and worked on problem solving and thought reframing.  ?Pt talked about her health.  She has cataracts in both eyes.  She hopes to have eye surgery in the near future.  She has an appointment to check on options but is concerned about the cost of the surgery.   ?Pt talked about her sons.   Both sons live with pt.   Addressed the stress of having little help from her sons.    Pt states that despite the stress she is glad that her sons live with her.   She likes having her family close to her.     ?Provided supportive therapy.      ? ?Interventions: Cognitive Behavioral Therapy and Insight-Oriented ? ?Diagnosis: F43.23 ? ?Plan: See pt's Treatment Plan for depression and anxiety in Therapy Charts.  (Treatment Plan Target Date: 11/06/2021) ?Pt is progressing toward treatment goals.   ?Plan to continue to see pt every two weeks.   ? ?Adarian Bur, LCSW ? ? ? ? ?

## 2021-06-04 ENCOUNTER — Ambulatory Visit (INDEPENDENT_AMBULATORY_CARE_PROVIDER_SITE_OTHER): Payer: 59 | Admitting: Psychology

## 2021-06-04 DIAGNOSIS — F4323 Adjustment disorder with mixed anxiety and depressed mood: Secondary | ICD-10-CM | POA: Diagnosis not present

## 2021-06-04 NOTE — Progress Notes (Signed)
[facility] Counselor/Therapist Progress Note ? ?Patient ID: Susan Lewis, MRN: [id number],   ? ?Date: 06/04/2021 ? ?Time Spent: 12:00pm-12:55pm   55 minutes  ? ?Treatment Type: Individual Therapy ? ?Reported Symptoms: stress, worry ? ?Mental Status Exam: ?Appearance:  Casual     ?Behavior: Appropriate  ?Motor: Normal  ?Speech/Language:  Normal Rate  ?Affect: Appropriate  ?Mood: normal  ?Thought process: normal  ?Thought content:   WNL  ?Sensory/Perceptual disturbances:   WNL  ?Orientation: oriented to person, place, time/date, and situation  ?Attention: Good  ?Concentration: Good  ?Memory: WNL  ?Fund of knowledge:  Good  ?Insight:   Good  ?Judgment:  Good  ?Impulse Control: Good  ? ?Risk Assessment: ?Danger to Self:  No ?Self-injurious Behavior: No ?Danger to Others: No ?Duty to Warn:no ?Physical Aggression / Violence:No  ?Access to Firearms a concern: No  ?Gang Involvement:No  ? ?Subjective: Pt present for face-to-face individual therapy via video Webex.  Pt consents to telehealth video session due to COVID 19 pandemic. ?Location of pt: home  ?Location of therapist: home office.  ?Pt talked about financial worries.  Both of  their cars have expired tags but pt can't afford to get their cars inspected and registered.   ?Pt was tearful as she talked about her worries about finances.   ?Addressed pt's worries and worked on problem solving and thought reframing.  ?Pt talked about work at [employer].  She is working about 18 hours a week.  Pt had her performance review and was told pt needs to communicate better about her issues and schedule and health needs.   Her boss states she would like for her to be more dedicated to her job.   ?Pt talked about her health issues.  She has chronic pain and has not been able to afford her pain medication.   Worked on pain management strategies.   ?Pt talked about her relationship with her husband.  He provides emotional support for pt but he does not take good care of  himself physically so she worries about him.  ?Provided supportive therapy.     ? ?Interventions: Cognitive Behavioral Therapy and Insight-Oriented ? ?Diagnosis: F43.23 ? ?Plan: See pt's Treatment Plan for depression and anxiety in Therapy Charts.  (Treatment Plan Target Date: 11/06/2021) ?Pt is progressing toward treatment goals.   ?Plan to continue to see pt every two weeks.   ? ?Andrea Ferrer, LCSW ? ? ? ?

## 2021-06-12 ENCOUNTER — Encounter (HOSPITAL_COMMUNITY)
Admission: EM | Disposition: A | Discharge status: Rehabilitation Facility | Payer: Self-pay | Source: Home / Self Care | Attending: Neurology

## 2021-06-12 ENCOUNTER — Emergency Department (HOSPITAL_COMMUNITY): Payer: 59

## 2021-06-12 ENCOUNTER — Emergency Department (HOSPITAL_COMMUNITY): Payer: 59 | Admitting: Certified Registered Nurse Anesthetist

## 2021-06-12 ENCOUNTER — Other Ambulatory Visit (HOSPITAL_COMMUNITY): Payer: 59

## 2021-06-12 ENCOUNTER — Inpatient Hospital Stay (HOSPITAL_COMMUNITY): Payer: 59

## 2021-06-12 ENCOUNTER — Other Ambulatory Visit (HOSPITAL_COMMUNITY): Payer: Self-pay

## 2021-06-12 ENCOUNTER — Inpatient Hospital Stay (HOSPITAL_COMMUNITY)
Admission: EM | Admit: 2021-06-12 | Discharge: 2021-06-15 | DRG: 024 | Disposition: A | Discharge status: Rehabilitation Facility | Payer: 59 | Attending: Neurology | Admitting: Neurology

## 2021-06-12 DIAGNOSIS — I63232 Cerebral infarction due to unspecified occlusion or stenosis of left carotid arteries: Secondary | ICD-10-CM

## 2021-06-12 DIAGNOSIS — J449 Chronic obstructive pulmonary disease, unspecified: Secondary | ICD-10-CM | POA: Diagnosis present

## 2021-06-12 DIAGNOSIS — Z20822 Contact with and (suspected) exposure to covid-19: Secondary | ICD-10-CM | POA: Diagnosis present

## 2021-06-12 DIAGNOSIS — R7989 Other specified abnormal findings of blood chemistry: Secondary | ICD-10-CM | POA: Diagnosis present

## 2021-06-12 DIAGNOSIS — I639 Cerebral infarction, unspecified: Secondary | ICD-10-CM

## 2021-06-12 DIAGNOSIS — F32A Depression, unspecified: Secondary | ICD-10-CM | POA: Diagnosis present

## 2021-06-12 DIAGNOSIS — I63419 Cerebral infarction due to embolism of unspecified middle cerebral artery: Secondary | ICD-10-CM | POA: Diagnosis present

## 2021-06-12 DIAGNOSIS — Z791 Long term (current) use of non-steroidal anti-inflammatories (NSAID): Secondary | ICD-10-CM

## 2021-06-12 DIAGNOSIS — F411 Generalized anxiety disorder: Secondary | ICD-10-CM | POA: Diagnosis not present

## 2021-06-12 DIAGNOSIS — R29810 Facial weakness: Secondary | ICD-10-CM | POA: Diagnosis present

## 2021-06-12 DIAGNOSIS — K219 Gastro-esophageal reflux disease without esophagitis: Secondary | ICD-10-CM | POA: Diagnosis present

## 2021-06-12 DIAGNOSIS — R0689 Other abnormalities of breathing: Secondary | ICD-10-CM | POA: Diagnosis present

## 2021-06-12 DIAGNOSIS — D62 Acute posthemorrhagic anemia: Secondary | ICD-10-CM | POA: Diagnosis not present

## 2021-06-12 DIAGNOSIS — G8191 Hemiplegia, unspecified affecting right dominant side: Secondary | ICD-10-CM | POA: Diagnosis present

## 2021-06-12 DIAGNOSIS — E785 Hyperlipidemia, unspecified: Secondary | ICD-10-CM | POA: Diagnosis present

## 2021-06-12 DIAGNOSIS — I63412 Cerebral infarction due to embolism of left middle cerebral artery: Secondary | ICD-10-CM | POA: Diagnosis present

## 2021-06-12 DIAGNOSIS — Z79899 Other long term (current) drug therapy: Secondary | ICD-10-CM

## 2021-06-12 DIAGNOSIS — F4310 Post-traumatic stress disorder, unspecified: Secondary | ICD-10-CM | POA: Diagnosis present

## 2021-06-12 DIAGNOSIS — M797 Fibromyalgia: Secondary | ICD-10-CM | POA: Diagnosis present

## 2021-06-12 DIAGNOSIS — F419 Anxiety disorder, unspecified: Secondary | ICD-10-CM | POA: Diagnosis present

## 2021-06-12 DIAGNOSIS — Z885 Allergy status to narcotic agent status: Secondary | ICD-10-CM

## 2021-06-12 DIAGNOSIS — I10 Essential (primary) hypertension: Secondary | ICD-10-CM

## 2021-06-12 DIAGNOSIS — R29713 NIHSS score 13: Secondary | ICD-10-CM | POA: Diagnosis present

## 2021-06-12 DIAGNOSIS — E876 Hypokalemia: Secondary | ICD-10-CM | POA: Diagnosis present

## 2021-06-12 DIAGNOSIS — F418 Other specified anxiety disorders: Secondary | ICD-10-CM | POA: Diagnosis not present

## 2021-06-12 DIAGNOSIS — D649 Anemia, unspecified: Secondary | ICD-10-CM | POA: Diagnosis present

## 2021-06-12 DIAGNOSIS — I6389 Other cerebral infarction: Secondary | ICD-10-CM | POA: Diagnosis not present

## 2021-06-12 DIAGNOSIS — R4701 Aphasia: Secondary | ICD-10-CM | POA: Diagnosis present

## 2021-06-12 DIAGNOSIS — F17210 Nicotine dependence, cigarettes, uncomplicated: Secondary | ICD-10-CM | POA: Diagnosis present

## 2021-06-12 DIAGNOSIS — D72829 Elevated white blood cell count, unspecified: Secondary | ICD-10-CM | POA: Diagnosis present

## 2021-06-12 DIAGNOSIS — I63512 Cerebral infarction due to unspecified occlusion or stenosis of left middle cerebral artery: Secondary | ICD-10-CM | POA: Diagnosis present

## 2021-06-12 DIAGNOSIS — I6609 Occlusion and stenosis of unspecified middle cerebral artery: Secondary | ICD-10-CM | POA: Diagnosis present

## 2021-06-12 DIAGNOSIS — I69351 Hemiplegia and hemiparesis following cerebral infarction affecting right dominant side: Secondary | ICD-10-CM | POA: Diagnosis not present

## 2021-06-12 DIAGNOSIS — I6522 Occlusion and stenosis of left carotid artery: Secondary | ICD-10-CM | POA: Diagnosis present

## 2021-06-12 HISTORY — PX: IR ANGIO EXTRACRAN SEL COM CAROTID INNOMINATE UNI R MOD SED: IMG5356

## 2021-06-12 HISTORY — PX: IR INTRAVSC STENT CERV CAROTID W/O EMB-PROT MOD SED INC ANGIO: IMG2304

## 2021-06-12 HISTORY — PX: IR PERCUTANEOUS ART THROMBECTOMY/INFUSION INTRACRANIAL INC DIAG ANGIO: IMG6087

## 2021-06-12 HISTORY — DX: Cerebral infarction, unspecified: I63.9

## 2021-06-12 HISTORY — PX: IR CT HEAD LTD: IMG2386

## 2021-06-12 HISTORY — PX: RADIOLOGY WITH ANESTHESIA: SHX6223

## 2021-06-12 LAB — COMPREHENSIVE METABOLIC PANEL
ALT: 19 U/L (ref 0–44)
AST: 19 U/L (ref 15–41)
Albumin: 3.7 g/dL (ref 3.5–5.0)
Alkaline Phosphatase: 68 U/L (ref 38–126)
Anion gap: 7 (ref 5–15)
BUN: 14 mg/dL (ref 8–23)
CO2: 26 mmol/L (ref 22–32)
Calcium: 8.7 mg/dL — ABNORMAL LOW (ref 8.9–10.3)
Chloride: 108 mmol/L (ref 98–111)
Creatinine, Ser: 1.02 mg/dL — ABNORMAL HIGH (ref 0.44–1.00)
GFR, Estimated: >60 mL/min (ref >60)
Glucose, Bld: 90 mg/dL (ref 70–99)
Potassium: 3.7 mmol/L (ref 3.5–5.1)
Sodium: 141 mmol/L (ref 135–145)
Total Bilirubin: 0.7 mg/dL (ref 0.3–1.2)
Total Protein: 6.1 g/dL — ABNORMAL LOW (ref 6.5–8.1)

## 2021-06-12 LAB — CBC
HCT: 41.1 % (ref 36.0–46.0)
Hemoglobin: 13.2 g/dL (ref 12.0–15.0)
MCH: 29.9 pg (ref 26.0–34.0)
MCHC: 32.1 g/dL (ref 30.0–36.0)
MCV: 93.2 fL (ref 80.0–100.0)
Platelets: 181 10*3/uL (ref 150–400)
RBC: 4.41 MIL/uL (ref 3.87–5.11)
RDW: 14.1 % (ref 11.5–15.5)
WBC: 7.5 10*3/uL (ref 4.0–10.5)
nRBC: 0 % (ref 0.0–0.2)

## 2021-06-12 LAB — POCT I-STAT 7, (LYTES, BLD GAS, ICA,H+H)
Acid-base deficit: 4 mmol/L — ABNORMAL HIGH (ref 0.0–2.0)
Bicarbonate: 23.9 mmol/L (ref 20.0–28.0)
Calcium, Ion: 1.07 mmol/L — ABNORMAL LOW (ref 1.15–1.40)
HCT: 36 % (ref 36.0–46.0)
Hemoglobin: 12.2 g/dL (ref 12.0–15.0)
O2 Saturation: 100 %
Patient temperature: 97.6
Potassium: 3.1 mmol/L — ABNORMAL LOW (ref 3.5–5.1)
Sodium: 143 mmol/L (ref 135–145)
TCO2: 25 mmol/L (ref 22–32)
pCO2 arterial: 51 mmHg — ABNORMAL HIGH (ref 32–48)
pH, Arterial: 7.276 — ABNORMAL LOW (ref 7.35–7.45)
pO2, Arterial: 238 mmHg — ABNORMAL HIGH (ref 83–108)

## 2021-06-12 LAB — I-STAT CHEM 8, ED
BUN: 15 mg/dL (ref 8–23)
Calcium, Ion: 1 mmol/L — ABNORMAL LOW (ref 1.15–1.40)
Chloride: 105 mmol/L (ref 98–111)
Creatinine, Ser: 0.9 mg/dL (ref 0.44–1.00)
Glucose, Bld: 86 mg/dL (ref 70–99)
HCT: 39 % (ref 36.0–46.0)
Hemoglobin: 13.3 g/dL (ref 12.0–15.0)
Potassium: 3.7 mmol/L (ref 3.5–5.1)
Sodium: 141 mmol/L (ref 135–145)
TCO2: 25 mmol/L (ref 22–32)

## 2021-06-12 LAB — RESP PANEL BY RT-PCR (FLU A&B, COVID) ARPGX2
Influenza A by PCR: NEGATIVE
Influenza B by PCR: NEGATIVE
SARS Coronavirus 2 by RT PCR: NEGATIVE

## 2021-06-12 LAB — PROTIME-INR
INR: 1 (ref 0.8–1.2)
Prothrombin Time: 13.5 seconds (ref 11.4–15.2)

## 2021-06-12 LAB — DIFFERENTIAL
Abs Immature Granulocytes: 0.02 10*3/uL (ref 0.00–0.07)
Basophils Absolute: 0 10*3/uL (ref 0.0–0.1)
Basophils Relative: 0 %
Eosinophils Absolute: 0.1 10*3/uL (ref 0.0–0.5)
Eosinophils Relative: 1 %
Immature Granulocytes: 0 %
Lymphocytes Relative: 16 %
Lymphs Abs: 1.2 10*3/uL (ref 0.7–4.0)
Monocytes Absolute: 0.4 10*3/uL (ref 0.1–1.0)
Monocytes Relative: 6 %
Neutro Abs: 5.8 10*3/uL (ref 1.7–7.7)
Neutrophils Relative %: 77 %

## 2021-06-12 LAB — HIV ANTIBODY (ROUTINE TESTING W REFLEX): HIV Screen 4th Generation wRfx: NONREACTIVE

## 2021-06-12 LAB — MRSA NEXT GEN BY PCR, NASAL: MRSA by PCR Next Gen: NOT DETECTED

## 2021-06-12 LAB — APTT: aPTT: 29 seconds (ref 24–36)

## 2021-06-12 LAB — CBG MONITORING, ED: Glucose-Capillary: 70 mg/dL (ref 70–99)

## 2021-06-12 SURGERY — IR WITH ANESTHESIA
Anesthesia: General

## 2021-06-12 MED ORDER — PROPOFOL 1000 MG/100ML IV EMUL
5.0000 ug/kg/min | INTRAVENOUS | Status: DC
  Administered 2021-06-12: 50 ug/kg/min via INTRAVENOUS

## 2021-06-12 MED ORDER — ASPIRIN 81 MG PO CHEW
81.0000 mg | CHEWABLE_TABLET | Freq: Every day | ORAL | Status: DC
  Administered 2021-06-13 – 2021-06-15 (×3): 81 mg via ORAL
  Filled 2021-06-12 (×3): qty 1

## 2021-06-12 MED ORDER — ACETAMINOPHEN 325 MG PO TABS: 650.0000 mg | ORAL_TABLET | ORAL | Status: DC | PRN

## 2021-06-12 MED ORDER — FENTANYL CITRATE (PF) 100 MCG/2ML IJ SOLN
INTRAMUSCULAR | Status: AC
  Filled 2021-06-12: qty 2

## 2021-06-12 MED ORDER — TICAGRELOR 60 MG PO TABS
ORAL_TABLET | ORAL | Status: DC | PRN
  Administered 2021-06-12: 180 mg via NASOGASTRIC

## 2021-06-12 MED ORDER — CLEVIDIPINE BUTYRATE 0.5 MG/ML IV EMUL
INTRAVENOUS | Status: AC
  Filled 2021-06-12: qty 50

## 2021-06-12 MED ORDER — ROCURONIUM BROMIDE 10 MG/ML (PF) SYRINGE
PREFILLED_SYRINGE | INTRAVENOUS | Status: DC | PRN
  Administered 2021-06-12: 50 mg via INTRAVENOUS
  Administered 2021-06-12: 10 mg via INTRAVENOUS

## 2021-06-12 MED ORDER — DEXAMETHASONE SODIUM PHOSPHATE 10 MG/ML IJ SOLN
INTRAMUSCULAR | Status: DC | PRN
Start: 2021-06-12 — End: 2021-06-12
  Administered 2021-06-12: 10 mg via INTRAVENOUS

## 2021-06-12 MED ORDER — IOHEXOL 300 MG/ML  SOLN: 100.0000 mL | Freq: Once | INTRAMUSCULAR | Status: DC | PRN

## 2021-06-12 MED ORDER — CLOPIDOGREL BISULFATE 300 MG PO TABS
ORAL_TABLET | ORAL | Status: AC
  Filled 2021-06-12: qty 1

## 2021-06-12 MED ORDER — CANGRELOR BOLUS VIA INFUSION
INTRAVENOUS | Status: DC | PRN
  Administered 2021-06-12: 970.5 ug via INTRAVENOUS

## 2021-06-12 MED ORDER — SODIUM CHLORIDE 0.9 % IV SOLN
INTRAVENOUS | Status: DC | PRN
  Administered 2021-06-12: 2 ug/kg/min via INTRAVENOUS

## 2021-06-12 MED ORDER — STROKE: EARLY STAGES OF RECOVERY BOOK
Freq: Once | Status: AC
  Filled 2021-06-12: qty 1

## 2021-06-12 MED ORDER — CEFAZOLIN SODIUM-DEXTROSE 2-3 GM-%(50ML) IV SOLR
INTRAVENOUS | Status: DC | PRN
  Administered 2021-06-12: 2 g via INTRAVENOUS

## 2021-06-12 MED ORDER — ACETAMINOPHEN 650 MG RE SUPP
650.0000 mg | RECTAL | Status: DC | PRN
Start: 2021-06-12 — End: 2021-06-15

## 2021-06-12 MED ORDER — EPTIFIBATIDE 20 MG/10ML IV SOLN
INTRAVENOUS | Status: AC
  Filled 2021-06-12: qty 10

## 2021-06-12 MED ORDER — ASPIRIN 81 MG PO CHEW
81.0000 mg | CHEWABLE_TABLET | Freq: Every day | ORAL | Status: DC
  Filled 2021-06-12: qty 1

## 2021-06-12 MED ORDER — NITROGLYCERIN 1 MG/10 ML FOR IR/CATH LAB
INTRA_ARTERIAL | Status: DC | PRN
  Administered 2021-06-12 (×2): 25 ug via INTRA_ARTERIAL

## 2021-06-12 MED ORDER — ASPIRIN 325 MG PO TABS
ORAL_TABLET | ORAL | Status: DC | PRN
  Administered 2021-06-12: 81 mg

## 2021-06-12 MED ORDER — TICAGRELOR 90 MG PO TABS
90.0000 mg | ORAL_TABLET | Freq: Two times a day (BID) | ORAL | Status: DC
  Administered 2021-06-12: 90 mg
  Filled 2021-06-12 (×3): qty 1

## 2021-06-12 MED ORDER — ASPIRIN 81 MG PO CHEW
CHEWABLE_TABLET | ORAL | Status: AC
  Filled 2021-06-12: qty 1

## 2021-06-12 MED ORDER — CEFAZOLIN SODIUM-DEXTROSE 2-4 GM/100ML-% IV SOLN
INTRAVENOUS | Status: AC
Start: 2021-06-12 — End: 2021-06-12
  Filled 2021-06-12: qty 100

## 2021-06-12 MED ORDER — PHENYLEPHRINE 40 MCG/ML (10ML) SYRINGE FOR IV PUSH (FOR BLOOD PRESSURE SUPPORT)
PREFILLED_SYRINGE | INTRAVENOUS | Status: DC | PRN
  Administered 2021-06-12: 120 ug via INTRAVENOUS

## 2021-06-12 MED ORDER — SODIUM CHLORIDE 0.9 % IV SOLN: INTRAVENOUS | Status: DC

## 2021-06-12 MED ORDER — DEXMEDETOMIDINE HCL IN NACL 400 MCG/100ML IV SOLN: 0.4000 ug/kg/h | INTRAVENOUS | Status: DC

## 2021-06-12 MED ORDER — CLEVIDIPINE BUTYRATE 0.5 MG/ML IV EMUL
0.0000 mg/h | INTRAVENOUS | Status: DC
  Administered 2021-06-12: 16 mg/h via INTRAVENOUS
  Administered 2021-06-13: 2 mg/h via INTRAVENOUS
  Administered 2021-06-13 (×2): 8 mg/h via INTRAVENOUS
  Filled 2021-06-12 (×4): qty 50

## 2021-06-12 MED ORDER — IOHEXOL 350 MG/ML SOLN
100.0000 mL | Freq: Once | INTRAVENOUS | Status: AC | PRN
  Administered 2021-06-12: 100 mL via INTRAVENOUS

## 2021-06-12 MED ORDER — SODIUM CHLORIDE 0.9 % IV SOLN: INTRAVENOUS | Status: DC | PRN

## 2021-06-12 MED ORDER — FENTANYL 2500MCG IN NS 250ML (10MCG/ML) PREMIX INFUSION
0.0000 ug/h | INTRAVENOUS | Status: DC
  Administered 2021-06-12: 50 ug/h via INTRAVENOUS
  Filled 2021-06-12: qty 250

## 2021-06-12 MED ORDER — CLEVIDIPINE BUTYRATE 0.5 MG/ML IV EMUL
INTRAVENOUS | Status: DC | PRN
  Administered 2021-06-12: 1 mg/h via INTRAVENOUS

## 2021-06-12 MED ORDER — SODIUM CHLORIDE 0.9% FLUSH: 3.0000 mL | Freq: Once | INTRAVENOUS | Status: DC

## 2021-06-12 MED ORDER — ACETAMINOPHEN 160 MG/5ML PO SOLN: 650.0000 mg | ORAL | Status: DC | PRN

## 2021-06-12 MED ORDER — SENNOSIDES-DOCUSATE SODIUM 8.6-50 MG PO TABS: 1.0000 | ORAL_TABLET | Freq: Every evening | ORAL | Status: DC | PRN

## 2021-06-12 MED ORDER — GLYCOPYRROLATE PF 0.2 MG/ML IJ SOSY
PREFILLED_SYRINGE | INTRAMUSCULAR | Status: DC | PRN
Start: 2021-06-12 — End: 2021-06-12
  Administered 2021-06-12: .2 mg via INTRAVENOUS

## 2021-06-12 MED ORDER — ENOXAPARIN SODIUM 40 MG/0.4ML IJ SOSY
40.0000 mg | PREFILLED_SYRINGE | INTRAMUSCULAR | Status: DC
  Administered 2021-06-13 – 2021-06-15 (×3): 40 mg via SUBCUTANEOUS
  Filled 2021-06-12 (×3): qty 0.4

## 2021-06-12 MED ORDER — FENTANYL CITRATE (PF) 250 MCG/5ML IJ SOLN
INTRAMUSCULAR | Status: DC | PRN
  Administered 2021-06-12: 100 ug via INTRAVENOUS

## 2021-06-12 MED ORDER — TICAGRELOR 90 MG PO TABS
90.0000 mg | ORAL_TABLET | Freq: Two times a day (BID) | ORAL | Status: DC
  Administered 2021-06-13 – 2021-06-15 (×5): 90 mg via ORAL
  Filled 2021-06-12 (×6): qty 1

## 2021-06-12 MED ORDER — CANGRELOR TETRASODIUM 50 MG IV SOLR
INTRAVENOUS | Status: AC
  Filled 2021-06-12: qty 50

## 2021-06-12 MED ORDER — PROPOFOL 10 MG/ML IV BOLUS
INTRAVENOUS | Status: DC | PRN
Start: 2021-06-12 — End: 2021-06-12
  Administered 2021-06-12: 10 mg via INTRAVENOUS
  Administered 2021-06-12 (×2): 30 mg via INTRAVENOUS
  Administered 2021-06-12 (×4): 10 mg via INTRAVENOUS
  Administered 2021-06-12: 50 mg via INTRAVENOUS
  Administered 2021-06-12: 150 mg via INTRAVENOUS

## 2021-06-12 MED ORDER — PROPOFOL 500 MG/50ML IV EMUL
INTRAVENOUS | Status: DC | PRN
  Administered 2021-06-12: 50 ug/kg/min via INTRAVENOUS

## 2021-06-12 MED ORDER — VERAPAMIL HCL 2.5 MG/ML IV SOLN
INTRAVENOUS | Status: AC
  Filled 2021-06-12: qty 2

## 2021-06-12 MED ORDER — LIDOCAINE 2% (20 MG/ML) 5 ML SYRINGE
INTRAMUSCULAR | Status: DC | PRN
  Administered 2021-06-12: 60 mg via INTRAVENOUS
  Administered 2021-06-12: 40 mg via INTRAVENOUS

## 2021-06-12 MED ORDER — ONDANSETRON HCL 4 MG/2ML IJ SOLN
INTRAMUSCULAR | Status: DC | PRN
Start: 2021-06-12 — End: 2021-06-12
  Administered 2021-06-12: 4 mg via INTRAVENOUS

## 2021-06-12 MED ORDER — TIROFIBAN HCL IN NACL 5-0.9 MG/100ML-% IV SOLN
INTRAVENOUS | Status: AC
  Filled 2021-06-12: qty 100

## 2021-06-12 MED ORDER — TICAGRELOR 90 MG PO TABS
ORAL_TABLET | ORAL | Status: AC
  Filled 2021-06-12: qty 2

## 2021-06-12 MED ORDER — SUCCINYLCHOLINE CHLORIDE 200 MG/10ML IV SOSY
PREFILLED_SYRINGE | INTRAVENOUS | Status: DC | PRN
  Administered 2021-06-12: 80 mg via INTRAVENOUS

## 2021-06-12 MED ORDER — ASPIRIN 325 MG PO TABS
ORAL_TABLET | ORAL | Status: AC
  Filled 2021-06-12: qty 2

## 2021-06-12 NOTE — Anesthesia Procedure Notes (Signed)
Procedure Name: Intubation ?Date/Time: 06/12/2021 9:48 AM ?Performed by: Betha Loa, CRNA ?Pre-anesthesia Checklist: Patient identified, Emergency Drugs available, Suction available and Patient being monitored ?Patient Re-evaluated:Patient Re-evaluated prior to induction ?Oxygen Delivery Method: Circle System Utilized ?Preoxygenation: Pre-oxygenation with 100% oxygen ?Induction Type: IV induction, Rapid sequence and Cricoid Pressure applied ?Laryngoscope Size: Mac and 3 ?Grade View: Grade II ?Tube type: Oral ?Number of attempts: 1 ?Airway Equipment and Method: Stylet and Oral airway ?Placement Confirmation: ETT inserted through vocal cords under direct vision, positive ETCO2 and breath sounds checked- equal and bilateral ?Secured at: 22 cm ?Tube secured with: Tape ?Dental Injury: Teeth and Oropharynx as per pre-operative assessment  ?Difficulty Due To: Difficult Airway- due to dentition ?Comments: Chipped upper L front tooth, bottom teeth loose and poor dentition noted. ? ? ? ? ?

## 2021-06-12 NOTE — Progress Notes (Signed)
SLP Cancellation Note ? ?Patient Details ?Name: Susan Lewis ?MRN: [id number] ?DOB: [birth year] ? ? ?Cancelled treatment:       Reason Eval/Treat Not Completed: Patient not medically ready. Order received for swallow and cognitive evaluations. Note that pt was intubated and went to IR. Will f/u for evaluations as able.  ? ? ? ?Osie Bond., M.A. CCC-SLP ?Acute Rehabilitation Services ?Pager [phone] ?Office [phone] ? ?06/12/2021, 12:28 PM ?

## 2021-06-12 NOTE — Anesthesia Preprocedure Evaluation (Signed)
Anesthesia Evaluation  ? ?Patient awake ? ?Preop documentation limited or incomplete due to emergent nature of procedure. ? ?Airway ?Mallampati: II ? ?TM Distance: >3 FB ?Neck ROM: Full ? ? ? Dental ? ?(+) Loose, Poor Dentition ?  ?Pulmonary ?Current Smoker,  ?  ?Pulmonary exam normal ? ? ? ? ? ? ? Cardiovascular ?hypertension,  ?Rhythm:Regular Rate:Normal ? ? ?  ?Neuro/Psych ?CVA, Residual Symptoms   ? GI/Hepatic ?  ?Endo/Other  ? ? Renal/GU ?  ? ?  ?Musculoskeletal ? ? Abdominal ?  ?Peds ? Hematology ?  ?Anesthesia Other Findings ? ? Reproductive/Obstetrics ? ?  ? ? ? ? ? ? ? ? ? ? ? ? ? ?  ?  ? ? ? ? ? ? ? ? ?Anesthesia Physical ?Anesthesia Plan ? ?ASA: 4 ? ?Anesthesia Plan: General  ? ?Post-op Pain Management: Minimal or no pain anticipated  ? ?Induction: Intravenous and Rapid sequence ? ?PONV Risk Score and Plan: 2 and Ondansetron, Dexamethasone and Treatment may vary due to age or medical condition ? ?Airway Management Planned: Oral ETT ? ?Additional Equipment: Arterial line ? ?Intra-op Plan:  ? ?Post-operative Plan: Possible Post-op intubation/ventilation ? ?Informed Consent: I have reviewed the patients History and Physical, chart, labs and discussed the procedure including the risks, benefits and alternatives for the proposed anesthesia with the patient or authorized representative who has indicated his/her understanding and acceptance.  ? ? ? ?Only emergency history available ? ?Plan Discussed with:  ? ?Anesthesia Plan Comments:   ? ? ? ? ? ? ?Anesthesia Quick Evaluation ? ?

## 2021-06-12 NOTE — Progress Notes (Signed)
Went to visit with pt. Pt gone to CT for scan.  Chaplain will follow as needed. ? ?Carloyn Jaeger, [facility], Pager [phone]  ?

## 2021-06-12 NOTE — Progress Notes (Addendum)
Patient ID: Emmalyn Hinson, female   DOB: 07-Jun-1960, 61 y.o.   MRN: [id number] ?INR. ?61 year old right-handed female.  Last seen n well 11 PM last night.  MRS 0 ? ?New onset of right-sided weakness aphasia ?CT brain no evidence intracranial hemorrhage.  Aspects 7.  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 ? ?CT perfusion scans.  Carl of 22 AML, mismatch of approximately 40 mL. ?Endovascular treatment of the occluded vessel discussed with &.  Procedure, reasons, alternatives, and potential complication reviewed in detail.  Risk of intracranial hemorrhage of 10%, worsening neurological condition, inability to revascularize and death were reviewed.  Spouse expressed understanding and provided consent for treatment. ?Fatima Sanger MD ?

## 2021-06-12 NOTE — ED Provider Notes (Signed)
?[facility] NEURO/TRAUMA/SURGICAL ICU ?Provider Note ? ? ?CSN: [id number] ?Arrival date & time: 06/12/21  0258 ? ?An emergency department physician performed an initial assessment on this suspected stroke patient at [phone]. ? ?History ? ?Chief Complaint  ?Patient presents with  ? Code Stroke  ? ? ?Susan Lewis is a 61 y.o. female. ? ?Chart completion is late as the patient went expeditiously from the emergency department to interventional radiology ? ?HPI ?Female presents as a code stroke via EMS.  History is obtained by those individuals, as the patient cannot provide any details, level 5 caveat. ?Last seen normal was yesterday, around midnight, reportedly. ?She awoke today with difficulty moving her right side, with aphasia. ?  ? ?Home Medications ?Prior to Admission medications   ?Medication Sig Start Date End Date Taking? Authorizing Provider  ?albuterol (VENTOLIN HFA) 108 (90 Base) MCG/ACT inhaler INHALE 2 PUFFS INTO THE LUNGS EVERY 6 HOURS AS NEEDED FOR UP TO 30 DAYS FOR WHEEZING 09/21/18   [provider]  ?atorvastatin (LIPITOR) 20 MG tablet Take 1 tablet by mouth at bedtime. 05/19/20   [provider]  ?busPIRone (BUSPAR) 15 MG tablet TAKE ONE TABLET BY MOUTH TWICE A DAY 08/22/20   Mliss Sax, MD  ?diclofenac Sodium (VOLTAREN) 1 % GEL Apply small amount sparingly to the top of the right foot twice daily 08/10/19   [provider]  ?fluticasone (FLONASE) 50 MCG/ACT nasal spray Place 1 spray into both nostrils daily. 04/23/21   Loyola Mast, MD  ?gabapentin (NEURONTIN) 300 MG capsule TAKE ONE CAPSULE BY MOUTH TWICE A DAY 05/15/21   Loyola Mast, MD  ?meloxicam (MOBIC) 15 MG tablet Take 1 tablet by mouth daily. 05/18/20   [provider]  ?omeprazole (PRILOSEC) 40 MG capsule Take 40 mg by mouth at bedtime.  06/10/16   [provider]  ?oxybutynin (DITROPAN) 5 MG tablet Take 1 tablet (5 mg total) by mouth 3 (three) times daily. 02/27/21   Loyola Mast, MD   ?sertraline (ZOLOFT) 100 MG tablet TAKE ONE AND ONE-HALF TABLETS BY MOUTH ONE TIME DAILY 05/04/21   Loyola Mast, MD  ?traMADol Janean Sark) 50 MG tablet TAKE ONE TABLET BY MOUTH TWICE A DAY 05/15/21   Loyola Mast, MD  ?traZODone (DESYREL) 150 MG tablet Take 150 mg by mouth at bedtime.  06/04/16   [provider]  ?   ? ?Allergies    ?Morphine and Codeine   ? ?Review of Systems   ?Review of Systems  ?Unable to perform ROS: Acuity of condition  ? ?Physical Exam ?Updated Vital Signs ?BP 106/60   Pulse 65   Temp 97.6 ?F (36.4 ?C) (Oral)   Resp (!) 22   Ht 5\' 5"  (1.651 m)   Wt 64.7 kg   SpO2 99%   BMI 23.74 kg/m?  ?Physical Exam ?Vitals and nursing note reviewed.  ?Constitutional:   ?   General: She is in acute distress.  ?   Appearance: She is well-developed. She is ill-appearing.  ?HENT:  ?   Head: Normocephalic and atraumatic.  ?Eyes:  ?   Conjunctiva/sclera: Conjunctivae normal.  ?Cardiovascular:  ?   Rate and Rhythm: Normal rate and regular rhythm.  ?Pulmonary:  ?   Effort: Pulmonary effort is normal. No respiratory distress.  ?   Breath sounds: Normal breath sounds. No stridor.  ?Abdominal:  ?   General: There is no distension.  ?Skin: ?   General: Skin is warm and dry.  ?Neurological:  ?  Mental Status: She is alert.  ?   Cranial Nerves: Cranial nerve deficit and dysarthria present.  ?   Comments: Right-sided weakness, gross aphasia on initial exam, more specific evaluation of deficits not performed prior to CT, and the patient going to interventional radiology Bacot use disorder, chronic pain, no noted prior stroke no prior stroke, tobacco use disorder, pain, depression tobacco use disorder aphasia, right-sided weakness  ?Psychiatric:     ?   Mood and Affect: Mood is anxious.  ? ? ?ED Results / Procedures / Treatments   ?Labs ?(all labs ordered are listed, but only abnormal results are displayed) ?Labs Reviewed  ?COMPREHENSIVE METABOLIC PANEL - Abnormal; Notable for the following components:  ?     Result Value  ? Creatinine, Ser 1.02 (*)   ? Calcium 8.7 (*)   ? Total Protein 6.1 (*)   ? All other components within normal limits  ?I-STAT CHEM 8, ED - Abnormal; Notable for the following components:  ? Calcium, Ion 1.00 (*)   ? All other components within normal limits  ?POCT I-STAT 7, (LYTES, BLD GAS, ICA,H+H) - Abnormal; Notable for the following components:  ? pH, Arterial 7.276 (*)   ? pCO2 arterial 51.0 (*)   ? pO2, Arterial 238 (*)   ? Acid-base deficit 4.0 (*)   ? Potassium 3.1 (*)   ? Calcium, Ion 1.07 (*)   ? All other components within normal limits  ?RESP PANEL BY RT-PCR (FLU A&B, COVID) ARPGX2  ?MRSA NEXT GEN BY PCR, NASAL  ?PROTIME-INR  ?APTT  ?CBC  ?DIFFERENTIAL  ?HIV ANTIBODY (ROUTINE TESTING W REFLEX)  ?CBG MONITORING, ED  ? ? ? ? ?Radiology ?CT HEAD WO CONTRAST ( ) ? ?Result Date: 06/12/2021 ?CLINICAL DATA:  Stroke, follow-up, status post percutaneous intervention EXAM: CT HEAD WITHOUT CONTRAST TECHNIQUE: Contiguous axial images were obtained from the base of the skull through the vertex without intravenous contrast. RADIATION DOSE REDUCTION: This exam was performed according to the departmental dose-optimization program which includes automated exposure control, adjustment of the mA and/or kV according to patient size and/or use of iterative reconstruction technique. COMPARISON:  CT head 06/12/2021 9:15 a.m, CT head from thrombectomy 06/12/2021 11:30 a.m. FINDINGS: Brain: Hyperdensity in the left MCA territory (series 3, images 19-24), which may represent petechial hemorrhage versus post thrombectomy contrast staining, which appears similar to the CT done immediately after intervention. Along the course of the left MCA, particularly the M1 and M2 segments, there is hyperdensity, which may represent subarachnoid hemorrhage versus contrast extravasation from the left MCA, which also appears similar to the CT done immediately after intervention. No new area of hypodensity to suggest acute  infarct. No mass, mass effect, or midline shift. No hydrocephalus. Vascular: No hyperdense vessel. Skull: Normal. Negative for fracture or focal lesion. Sinuses/Orbits: No acute finding. Other: Trace fluid in left mastoid air cells. IMPRESSION: Status post thrombectomy, with hyperdensity in the left MCA territory, favored to represent post thrombectomy contrast staining although petechial hemorrhage could appear similar. Additional hyperdensity along the course of the left M1 and M2, which may represent contrast extravasation from the left MCA versus subarachnoid hemorrhage. These areas of hyperdensity appear similar to the CT done immediately after intervention. Electronically Signed   By: Wiliam Ke M.D.   On: 06/12/2021 15:55  ? ?DG CHEST PORT 1 VIEW ? ?Result Date: 06/12/2021 ?CLINICAL DATA:  COPD, ETT placement EXAM: PORTABLE CHEST 1 VIEW COMPARISON:  12/29/2020 FINDINGS: Endotracheal tube with the tip 2.6 cm above the  carina. Nasogastric tube coursing below the diaphragm. Left basilar airspace disease likely reflecting atelectasis versus pneumonia. No focal consolidation. No pleural effusion or pneumothorax. Heart and mediastinal contours are unremarkable. No acute osseous abnormality. IMPRESSION: 1. Endotracheal tube with the tip 2.6 cm above the carina. Nasogastric tube coursing below the diaphragm. 2. Left basilar atelectasis versus pneumonia. Electronically Signed   By: Elige KoHetal  Patel M.D.   On: 06/12/2021 12:58  ? ?CT HEAD CODE STROKE WO CONTRAST ? ?Result Date: 06/12/2021 ?CLINICAL DATA:  Code stroke.  61 year old female EXAM: CT HEAD WITHOUT CONTRAST TECHNIQUE: Contiguous axial images were obtained from the base of the skull through the vertex without intravenous contrast. RADIATION DOSE REDUCTION: This exam was performed according to the departmental dose-optimization program which includes automated exposure control, adjustment of the mA and/or kV according to patient size and/or use of iterative  reconstruction technique. COMPARISON:  Head CT 05/30/2019. FINDINGS: Brain: New but chronic appearing cortical encephalomalacia in the posterior left MCA territory since 2021. Associated subcortical white matter gliosis. H

## 2021-06-12 NOTE — Progress Notes (Signed)
Patient presented to [facility] today as a Code Stroke. She underwent s diagnostic cerebral angiogram and was found to have an occluded left internal carotid artery and occluded left middle cerebral artery. She is s/p thrombectomy with complete revascularization of the left MCA and s/p revascularization of the left internal carotid with stent-assisted angioplasty.  ? ?She remained intubated post-procedure and was assessed in CT for her follow up CT head. She is on a propofol infusion and mostly unresponsive but she was noted to move the left arm, left leg and right toes. Pupils 2 mm bilaterally, equal and responsive. Right groin site has some shadowing on the dressing but is otherwise soft/dry.  ? ?She is scheduled to receive aspirin and brilinta tonight via orogastric tube. NIR will evaluate patient in the morning. Please call NIR with any questions. ? Alwyn Ren, AGACNP-BC ?[phone] ?06/12/2021, 3:55 PM ? ? ?

## 2021-06-12 NOTE — Procedures (Addendum)
?  INR. ?Bilateral common carotid arteriograms.  Right common femoral artery approach. ? ?Findings. ?1.  Occluded left internal carotid artery at the bulb. ?2 occluded inferior division of the left middle cerebral artery in t approximately. ?#3 status post complete revascularization of occluded left middle cerebral artery inferior division with 1 pass with Sulster 4 mm x 40 mm X retriever and contact aspiration achieving aTICI 3 revascularization. ?4.  Status post revascularization of occluded left internal carotid proximally with stent assisted angioplasty, and proximal flow arrest. ?Postprocedural CT of the brain demonstrating contrast blush in the putamen and mild subarachnoid attenuation in the sylvian f.  Contrast-enhancement noted in the left parietal cortical regions. ?No mass effect noted. ? ?A French Angio-Seal closure device for hemostasis at the right groin puncture site.  Distal pulses all palpable in both feet unchanged.  ?Pupils 3 to 4 mm bilaterally sluggishly reactive. ?Patient left intubated because of labile hypertension and post CT findings. ?Medications are aspirin 81 mg p.o. in Brilinta under 80 mg p.o. via orogastric tube.  Cangrelor bolus followed by a 25-minute infusion. ?Follow-up CT of the brain at 3 PM.  Please let neurohospitalist our stroke neurologist informed. ? ?Susan Sanger MD ? ?

## 2021-06-12 NOTE — TOC CAGE-AID Note (Signed)
Transition of Care (TOC) - CAGE-AID Screening ? ? ?Patient Details  ?Name: Susan Lewis ?MRN: [id number] ?Date of Birth: May 06, 1960 ? ?Transition of Care (TOC) CM/SW Contact:    ?Dinesha Twiggs C Tarpley-Carter, LCSWA ?Phone Number: ?06/12/2021, 2:45 PM ? ? ?Clinical Narrative: ?Pt is unable to participate in Cage Aid. ?CSW will assess at a better time. ? ?Insurance underwriter, MSW, LCSW-A ?Pronouns:  She/Her/Hers ?[facility]itions of [facility] ?Clinical Social Worker ?Direct Number:  [phone] ?[email or web address]  ? ?CAGE-AID Screening: ?Substance Abuse Screening unable to be completed due to: : Patient unable to participate ? ?  ?  ?  ?  ?  ? ?Substance Abuse Education Offered: No ? ?  ? ? ? ? ? ? ?

## 2021-06-12 NOTE — Procedures (Signed)
Extubation Procedure Note ? ?Patient Details:   ?Name: Susan Lewis ?DOB: June 09, 1960 ?MRN: [id number] ?  ?Airway Documentation:  ?  ?Vent end date: 06/12/21 Vent end time: 1736  ? ?Evaluation ? O2 sats: stable throughout ?Complications: No apparent complications ?Patient did tolerate procedure well. ?Bilateral Breath Sounds: Clear, Diminished ?  ?Yes, ? ?Per MD order, pt was extubated to [facility]. Pt did have a positive cuff leak prior to extubation. Pt tolerated well with SVS. No stridor noted. Pt is able to follow commands. RN currently at bedside. RT will continue to monitor pt. ? ?Megan Mans ?06/12/2021, 5:38 PM ? ?

## 2021-06-12 NOTE — H&P (Signed)
Admission H&P ?   ? ? ?Neurology H & P ?  ?Reason for Consult: code stroke ?Referring Physician: Dr. Vanita Panda ?  ?CC: code stroke ?  ?History is obtained from: chart review, patient, EMS ?  ?HPI: Susan Lewis is a 62 y.o. female with a past medical history of COPD, cataracts, and PTSD, who presents to the emergency department today as a code stroke.  The patient was last seen well by her husband at 67 PM last night.  At this time she was in her usual state of health.  At baseline she is independent for ADLs and IADLs.  She reportedly woke up at 5:30 in the morning today and was unable to get out of bed.  In trying to do so, she fell onto the floor.  Family noticed difficulty with patient communication and called EMS who noticed aphasia and right hemiparesis and called a code stroke in route.  On assessment upon arrival at the bridge, the patient had severe expressive aphasia with a right-sided hemiplegia.  With NIH stroke scale of 13.  Noncontrast CT scan of the head was obtained emergently which showed low-density in the like left insula compatible with acute stroke as well as old left parietal hypodensity compatible with a remote stroke.  The left middle cerebral artery also had hyperdense sign.  Aspect score was 7.  Patient underwent immediate CT angiogram of the brain and CT perfusion.  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  The likely left M2 occlusion CT perfusion showed core infarct of 22 mL with mismatch volume of 38 mL.  I discussed with patient's son and husband at the bedside risk-benefit of mechanical thrombectomy and they wanted to proceed.  Spoke to Dr. Estanislado Pandy from interventional neuroradiology who agreed with taking the patient for emergent mechanical thrombectomy.  Patient's husband denies any known prior history of stroke or carotid stenosis.  Patient was not on  any blood thinners at baseline ?  ?LKW: 11 PM last night ?TNK given?: no, outside of window ?IR Thrombectomy? yes ?Modified Rankin Scale: 0-Completely asymptomatic and back to baseline post- stroke ?NIH stroke scale on admission 13 ?ROS: A complete ROS was performed and is negative except as noted in the HPI. ?  ?    ?Past Medical History:  ?Diagnosis Date  ? Anxiety    ? Arthritis    ? Bursitis    ? Carpal tunnel syndrome    ? COPD (chronic obstructive pulmonary disease) ([place])    ? Depression    ? Fibromyalgia    ? Fibromyalgia    ? GERD (gastroesophageal reflux disease)    ? Hemorrhoids 10/27/2014  ? Hyperlipidemia    ? Hypertension    ? Kidney stones    ?  ?  ?  ?No family history on file. ?  ?  ?Social History:  ? reports that she has been smoking cigarettes. She started smoking about 49 years ago. She has a 45.00 pack-year smoking history. She has never used smokeless tobacco. She reports current drug use. Drug: Marijuana. She reports that she does not drink alcohol. ?  ?Medications ?  ?Current Facility-Administered Medications:  ?  aspirin 325 MG tablet, , , ,  ?  aspirin 81 MG chewable tablet, , , ,  ?  cangrelor (KENGREAL) 50 MG SOLR, , , ,  ?  ceFAZolin (ANCEF) 2-4 GM/100ML-% IVPB, , , ,  ?  clopidogrel (PLAVIX) 300 MG tablet, , , ,  ?  eptifibatide (INTEGRILIN) 20 MG/10ML injection, , , ,  ?  fentaNYL (SUBLIMAZE) 100 MCG/2ML injection, , , ,  ?  sodium chloride flush (NS) 0.9 % injection 3 mL, 3 mL, Intravenous, Once, Gerhard Munch, MD ?  ticagrelor (BRILINTA) 90 MG tablet, , , ,  ?  tirofiban (AGGRASTAT) 5-0.9 MG/100ML-% injection, , , ,  ?  verapamil (ISOPTIN) 2.5 MG/ML injection, , , ,  ?  ?Current Outpatient Medications:  ?  albuterol (VENTOLIN HFA) 108 (90 Base) MCG/ACT inhaler, INHALE 2 PUFFS INTO THE LUNGS EVERY 6 HOURS AS NEEDED FOR UP TO 30 DAYS FOR WHEEZING, Disp: , Rfl:  ?  atorvastatin (LIPITOR) 20 MG tablet, Take 1 tablet by mouth at bedtime., Disp: , Rfl:  ?  busPIRone (BUSPAR) 15 MG  tablet, TAKE ONE TABLET BY MOUTH TWICE A DAY, Disp: 60 tablet, Rfl: 5 ?  diclofenac Sodium (VOLTAREN) 1 % GEL, Apply small amount sparingly to the top of the right foot twice daily, Disp: , Rfl:  ?  fluticasone (FLONASE) 50 MCG/ACT nasal spray, Place 1 spray into both nostrils daily., Disp: 9.9 mL, Rfl: 5 ?  gabapentin (NEURONTIN) 300 MG capsule, TAKE ONE CAPSULE BY MOUTH TWICE A DAY, Disp: 180 capsule, Rfl: 3 ?  meloxicam (MOBIC) 15 MG tablet, Take 1 tablet by mouth daily., Disp: , Rfl:  ?  omeprazole (PRILOSEC) 40 MG capsule, Take 40 mg by mouth at bedtime. , Disp: , Rfl: 0 ?  oxybutynin (DITROPAN) 5 MG tablet, Take 1 tablet (5 mg total) by mouth 3 (three) times daily., Disp: 90 tablet, Rfl: 2 ?  sertraline (ZOLOFT) 100 MG tablet, TAKE ONE AND ONE-HALF TABLETS BY MOUTH ONE TIME DAILY, Disp: 135 tablet, Rfl: 3 ?  traMADol (ULTRAM) 50 MG tablet, TAKE ONE TABLET BY MOUTH TWICE A DAY, Disp: 60 tablet, Rfl: 2 ?  traZODone (DESYREL) 150 MG tablet, Take 150 mg by mouth at bedtime. , Disp: , Rfl:  ?  ?Facility-Administered Medications Ordered in Other Encounters:  ?  0.9 %  sodium chloride infusion, , Intravenous, Continuous PRN, Lonia Mad, CRNA, New Bag at 06/12/21 0940 ?  ceFAZolin (ANCEF) IVPB 2 g/50 mL premix, , Intravenous, Anesthesia Intra-op, Loma Sousa T, CRNA, 2 g at 06/12/21 5188 ?  fentaNYL citrate (PF) (SUBLIMAZE) injection, , Intravenous, Anesthesia Intra-op, Loma Sousa T, CRNA, 100 mcg at 06/12/21 4166 ?  glycopyrrolate (ROBINUL) injection, , Intravenous, Anesthesia Intra-op, Loma Sousa T, CRNA, 0.2 mg at 06/12/21 0630 ?  lidocaine 2% (20 mg/mL) 5 mL syringe, , Intravenous, Anesthesia Intra-op, Loma Sousa T, CRNA, 60 mg at 06/12/21 0948 ?  PHENYLephrine 40 mcg/ml in normal saline Adult IV Push Syringe (For Blood Pressure Support), , Intravenous, Anesthesia Intra-op, Loma Sousa T, CRNA, 120 mcg at 06/12/21 1601 ?  propofol (DIPRIVAN) 10 mg/mL bolus/IV push, ,  Intravenous, Anesthesia Intra-op, Loma Sousa T, CRNA, 50 mg at 06/12/21 0950 ?  rocuronium bromide 10 mg/mL (PF) syringe, , Intravenous, Anesthesia Intra-op, Loma Sousa T, CRNA, 50 mg at 06/12/21 0950 ?  succinylcholine (ANECTINE) syringe, , Intravenous, Anesthesia Intra-op, Loma Sousa T, CRNA, 80 mg at 06/12/21 0932 ?  ?  ?Exam: ?Current vital signs: ?BP 137/86   Pulse 71   Temp 98.2 ?F (36.8 ?C) (Oral)   Resp 19   SpO2 99%  ?Vital signs in last 24 hours: ?Temp:  [98.2 ?F (36.8 ?C)] 98.2 ?F (36.8 ?C) (03/28 0950) ?Pulse Rate:  [71]  71 (03/28 0950) ?Resp:  [19] 19 (03/28 0950) ?BP: (137)/(86) 137/86 (03/28 0950) ?SpO2:  [99 %] 99 % (03/28 0950) ?  ?GENERAL: Pleasant middle-age Caucasian lady awake, alert.  Appears frustrated due to her aphasia ?Head: Normocephalic and atraumatic, without obvious abnormality ?EENT: Normal conjunctivae, dry mucous membranes, no OP obstruction ?LUNGS: Normal respiratory effort. Non-labored breathing on room air ?CV: Regular rate and rhythm on telemetry ?Extremities: warm, well perfused, without obvious deformity ?  ?NEURO:  ?Mental Status: Awake, alert, unable to answer orientation questions due to severe expressive aphasia but able to understand and follows most commands.  Speaks occasional words with great difficulty but unable to speak sentences ?Naming is significantly impaired as is repetition; comprehension intact  ?Cranial Nerves:  ?II: PERRL 3 mm/brisk. visual fields full.  ?III, IV, VI: EOMI. Lid elevation symmetric and full.  ?V: Sensation is intact to light touch and symmetrical to face. Blinks to threat. Moves jaw back and forth.  ?VII: mild right lower facial droop ?VIII: Hearing intact to voice ?IX, X: untested ?XI: untested ?XII: untested   ?Motor: RUE flaccid with 1/5, and trace withdrawal to pain.  RLE 2/5, LUE 5/5, LLE 5/5 ?Sensation: severe R sided deficit ?Coordination: FTN intact with L hand, not with right, heel to shin intact with left leg,  not right.  ?Gait: Deferred ?  ?NIHSS: ?1a Level of Conscious.: 0  ?1b LOC Questions: 2 ?1c LOC Commands: 0  ?2 Best Gaze: 0  ?3 Visual: 0 ?4 Facial Palsy: 1 ?5a Motor Arm - left: 0 ?5b Motor Arm - Right: 3 ?6a Motor Leg

## 2021-06-12 NOTE — Sedation Documentation (Signed)
Hand off of care given to Maralyn Sago, RN. All questions answered. Sarah made aware of Dr. Leroy Kennedy request/order for a repeat CT heat at 3pm. Right femoral site assessed at bedside with ICU staff along with pulses ?

## 2021-06-12 NOTE — Transfer of Care (Signed)
Immediate Anesthesia Transfer of Care Note ? ?[name] ? ?Procedure(s) Performed: IR WITH ANESTHESIA ? ?Patient Location: ICU ? ?Anesthesia Type:General ? ?Level of Consciousness: sedated and Patient remains intubated per anesthesia plan ? ?Airway & Oxygen Therapy: Patient remains intubated per anesthesia plan and Patient placed on Ventilator (see vital sign flow sheet for setting) ? ?Post-op Assessment: Report given to RN and Post -op Vital signs reviewed and stable ? ?Post vital signs: Reviewed and stable ? ?Last Vitals:  ?Vitals Value Taken Time  ?BP 124/66 06/12/21 1218  ?Temp 36.4 ?C 06/12/21 1218  ?Pulse 79 06/12/21 1221  ?Resp 10 06/12/21 1223  ?SpO2 100 % 06/12/21 1221  ?Vitals shown include unvalidated device data. ? ?Last Pain:  ?Vitals:  ? 06/12/21 1218  ?TempSrc: Oral  ?   ? ?  ? ?Complications: No notable events documented. ?

## 2021-06-12 NOTE — ED Triage Notes (Signed)
Code stroke called by EMS. On arrival pt brought to CT then straight to IR suite.  ?Pt aphasic with right sided weakness, last known normal last night 2300  ?

## 2021-06-12 NOTE — Progress Notes (Signed)
RT assisted with transport of this pt from 4N17 to CT and back while on full ventilator support. Pt tolerated well with SVS and no complications. RN currently at bedside. RT will continue to monitor pt as needed. ?

## 2021-06-12 NOTE — Plan of Care (Signed)
  Problem: Education: Goal: Knowledge of disease or condition will improve Outcome: Progressing Goal: Knowledge of secondary prevention will improve (SELECT ALL) Outcome: Progressing Goal: Knowledge of patient specific risk factors will improve (INDIVIDUALIZE FOR PATIENT) Outcome: Progressing Goal: Individualized Educational Video(s) Outcome: Progressing   

## 2021-06-12 NOTE — Progress Notes (Signed)
?  Transition of Care (TOC) Screening Note ? ? ?Patient Details  ?Name: Susan Lewis ?Date of Birth: [birth year] ? ? ?Transition of Care (TOC) CM/SW Contact:    ?Mearl Latin, LCSW ?Phone Number: ?06/12/2021, 11:03 AM ? ? ? ?Transition of Care Department [facility]) has reviewed patient and no TOC needs have been identified at this time. We will continue to monitor patient advancement through interdisciplinary progression rounds. If new patient transition needs arise, please place a TOC consult. ? ? ?

## 2021-06-12 NOTE — TOC Benefit Eligibility Note (Signed)
Patient Advocate Encounter ? ?Insurance verification completed.   ? ?The patient is currently admitted and upon discharge could be taking Brilinta 90 mg. ? ?The current 30 day co-pay is, $40.00.  ? ?The patient is insured through [employer]  ? ? ? ?Roland Earl, CPhT ?Pharmacy Patient Advocate Specialist ?[facility] Patient Advocate Team ?Direct Number: [phone]  Fax: [phone] ? ? ? ? ? ?  ?

## 2021-06-12 NOTE — Consult Note (Signed)
? ?NAME:  Susan Lewis, MRN:  [id number], DOB:  05/19/1960, LOS: 0 ?ADMISSION DATE:  06/12/2021, CONSULTATION DATE:  06/12/2021 ?REFERRING MD:  Dr Leonie Man, CHIEF COMPLAINT:  Stroke  ? ?History of Present Illness:  ?Patient is a 61 year old female with PMHx as stated below presenting with right sided weakness and aphasia. History obtained via chart review. Patient's [id number] on 06/11/2020 and awoke with difficulty moving her right side and difficulty speaking. ?Code Stroke activated on arrival. CT Head, CTA and CTP obtained. CT Head w acute on chronic left MCA territory ischemia with CTA Head/Neck with occlusion of left ICA and posterior M2 left MCA branch at bifurcation. Patient underwent IR guidede revascularization of left ICA with stent and left MCA. Patient left intubated post-procedure. PCCM consulted.  ? ?Pertinent  Medical History  ? ?Past Medical History:  ?Diagnosis Date  ? Anxiety   ? Arthritis   ? Bursitis   ? Carpal tunnel syndrome   ? COPD (chronic obstructive pulmonary disease) ([place])   ? Depression   ? Fibromyalgia   ? Fibromyalgia   ? GERD (gastroesophageal reflux disease)   ? Hemorrhoids 10/27/2014  ? Hyperlipidemia   ? Hypertension   ? Kidney stones   ? ?Significant Hospital Events: ?Including procedures, antibiotic start and stop dates in addition to other pertinent events   ?06/12/21 > admitted for Code Stroke in left MCA territory with left ICA and left posterior MCA branch occlusion s/p revascularization w/IR. Intubated ? ?Interim History / Subjective:  ?Patient is sedated on propofol gtt and mechanically ventilated. Resting comfortable in bed.  ? ?Objective   ?Blood pressure 131/64, pulse 83, temperature 97.6 ?F (36.4 ?C), temperature source Oral, resp. rate (!) 21, height 5\' 5"  (1.651 m), weight 64.7 kg, SpO2 100 %. ?   ?Vent Mode: PRVC ?FiO2 (%):  [100 %] 100 % ?Set Rate:  [16 bmp] 16 bmp ?Vt Set:  [450 mL] 450 mL ?PEEP:  [5 cmH20] 5 cmH20 ?Plateau Pressure:  [14 cmH20] 14 cmH20  ? ?Intake/Output  Summary (Last 24 hours) at 06/12/2021 1238 ?Last data filed at 06/12/2021 1224 ?Gross per 24 hour  ?Intake 1000 ml  ?Output 2100 ml  ?Net -1100 ml  ? ?Filed Weights  ? 06/12/21 1000  ?Weight: 64.7 kg  ? ? ?Examination: ?General: acutely ill appearing elderly female, intubated; no acute distress ?HENT: [place]/AT, PERRL, MMM, ETT in place ?Lungs: CTAB, no wheezing or rhonchi; on full vent support ?Cardiovascular: RRR, S1 and S2 present; no m/r/g ?Abdomen: soft, nondistended, +BS ?Extremities: warm and dry, no peripheral edema ?Neuro: sedated; PERRL ?Skin: no rashes or lesions noted ? ?Resolved Hospital Problem list   ? 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 Underwent thrombectomy and stent placement with IR. Currently intubated and sedated. ?- Neurology following, appreciate recommendations ?- BP goals and glucose goals per stroke service ; cleviprex gtt for SBP 120-140 ?- A1c, lipid panel ?- Echo w/bubble study ?- MRI Brain wo contrast; repeat CT Head @ 3pm ?- Aspirin and Brilinta  ?- Would benefit from initiation of statin therapy  ?- PT/OT/ SLP once awake  ? ?Acute respiratory insufficiency 2/2 acute stroke as above ?Hx of COPD ?Continue full vent support ?VAP bundle ?Daily WUA/SBT ? ?PTSD/Depression ?-  Resume home meds once able ? ?Elevated serum creatinine  ?Baseline 0.88 5 months ago; 1.02 today. No electrolyte abnormalities ?- Trend renal function ? ?Best Practice (right click and "Reselect all SmartList Selections" daily)  ? ?Diet/type: NPO ?DVT prophylaxis: LMWH ?GI prophylaxis: N/A ?Lines: N/A ?Foley:  N/A ?Code Status:  full code ?Last date of multidisciplinary goals of care discussion [per stroke team] ? ?Labs   ?CBC: ?Recent Labs  ?Lab 06/12/21 ?0909  06/12/21 ?0916  ?WBC 7.5  --   ?NEUTROABS 5.8  --   ?HGB 13.2 13.3  ?HCT 41.1 39.0  ?MCV 93.2  --   ?PLT 181  --   ? ? ?Basic Metabolic Panel: ?Recent Labs  ?Lab 06/12/21 ?0909 06/12/21 ?0916  ?NA 141 141  ?K 3.7 3.7  ?CL 108 105  ?CO2 26  --   ?GLUCOSE 90 86  ?BUN 14 15  ?CREATININE 1.02* 0.90  ?CALCIUM 8.7*  --   ? ?GFR: ?Estimated Creatinine Clearance: 57.6 mL/min (by C-G formula based on SCr of 0.9 mg/dL). ?Recent Labs  ?Lab 06/12/21 ?[id number]  ?WBC 7.5  ? ? ?Liver Function Tests: ?Recent Labs  ?Lab 06/12/21 ?[id number]  ?AST 19  ?ALT 19  ?ALKPHOS 68  ?BILITOT 0.7  ?PROT 6.1*  ?ALBUMIN 3.7  ? ?No results for input(s): LIPASE, AMYLASE in the last 168 hours. ?No results for input(s): AMMONIA in the last 168 hours. ? ?ABG ?   ?Component Value Date/Time  ? TCO2 25 06/12/2021 0916  ?  ? ?Coagulation Profile: ?Recent Labs  ?Lab 06/12/21 ?[id number]  ?INR 1.0  ? ? ?Cardiac Enzymes: ?No results for input(s): CKTOTAL, CKMB, CKMBINDEX, TROPONINI in the last 168 hours. ? ?HbA1C: ?No results found for: HGBA1C ? ?CBG: ?Recent Labs  ?Lab 06/12/21 ?[id number]  ?GLUCAP 70  ? ? ?Review of Systems:   ?Unable to complete at this time ? ?Past Medical History:  ?She,  has a past medical history of Anxiety, Arthritis, Bursitis, Carpal tunnel syndrome, COPD (chronic obstructive pulmonary disease) ([place]), Depression, Fibromyalgia, Fibromyalgia, GERD (gastroesophageal reflux disease), Hemorrhoids (10/27/2014), Hyperlipidemia, Hypertension, and Kidney stones.  ? ?Surgical History:  ? ?Past Surgical History:  ?Procedure Laterality Date  ? CARPAL TUNNEL RELEASE Right   ? carpel tunnel   ? CESAREAN SECTION    ? CHOLECYSTECTOMY    ? ENDOMETRIAL ABLATION    ? EXCISIONAL HEMORRHOIDECTOMY    ? OOPHORECTOMY Left   ? ORIF ANKLE FRACTURE BIMALLEOLAR    ?  ? ?Social History:  ? reports that she has been smoking cigarettes. She started smoking about 49 years ago. She has a 45.00 pack-year smoking history. She has never used smokeless tobacco. She reports current drug use.  Drug: Marijuana. She reports that she does not drink alcohol.  ? ?Family History:  ?Her family history is not on file.  ? ?Allergies ?Allergies  ?Allergen Reactions  ? Morphine Other (See Comments)  ?  Hallucination  ? Codeine Itching  ?  ? ?Home Medications  ?Prior to Admission medications   ?Medication Sig Start Date End Date Taking? Authorizing Provider  ?albuterol (VENTOLIN HFA) 108 (90 Base) MCG/ACT inhaler INHALE 2 PUFFS INTO THE LUNGS EVERY 6 HOURS AS NEEDED FOR UP TO 30 DAYS FOR WHEEZING 09/21/18   [provider]  ?atorvastatin (LIPITOR) 20 MG tablet Take 1 tablet by mouth at bedtime. 05/19/20   [provider]  ?busPIRone (BUSPAR) 15 MG tablet TAKE ONE TABLET BY MOUTH TWICE A DAY 08/22/20   Libby Maw, MD  ?  diclofenac Sodium (VOLTAREN) 1 % GEL Apply small amount sparingly to the top of the right foot twice daily 08/10/19   [provider]  ?fluticasone (FLONASE) 50 MCG/ACT nasal spray Place 1 spray into both nostrils daily. 04/23/21   Haydee Salter, MD  ?gabapentin (NEURONTIN) 300 MG capsule TAKE ONE CAPSULE BY MOUTH TWICE A DAY 05/15/21   Haydee Salter, MD  ?meloxicam (MOBIC) 15 MG tablet Take 1 tablet by mouth daily. 05/18/20   [provider]  ?omeprazole (PRILOSEC) 40 MG capsule Take 40 mg by mouth at bedtime.  06/10/16   [provider]  ?oxybutynin (DITROPAN) 5 MG tablet Take 1 tablet (5 mg total) by mouth 3 (three) times daily. 02/27/21   Haydee Salter, MD  ?sertraline (ZOLOFT) 100 MG tablet TAKE ONE AND ONE-HALF TABLETS BY MOUTH ONE TIME DAILY 05/04/21   Haydee Salter, MD  ?traMADol Veatrice Bourbon) 50 MG tablet TAKE ONE TABLET BY MOUTH TWICE A DAY 05/15/21   Haydee Salter, MD  ?traZODone (DESYREL) 150 MG tablet Take 150 mg by mouth at bedtime.  06/04/16   [provider]  ?  ? ?Critical care time:  ?  ? ? ? ? ? ?

## 2021-06-12 NOTE — Sedation Documentation (Signed)
8Fr angioceal placed to the right femoral ?

## 2021-06-12 NOTE — Anesthesia Postprocedure Evaluation (Signed)
Anesthesia Post Note ? ?[name] ? ?Procedure(s) Performed: IR WITH ANESTHESIA ? ?  ? ?Patient location during evaluation: SICU ?Anesthesia Type: General ?Level of consciousness: sedated ?Pain management: pain level controlled ?Vital Signs Assessment: post-procedure vital signs reviewed and stable ?Respiratory status: patient remains intubated per anesthesia plan ?Cardiovascular status: stable ?Postop Assessment: no apparent nausea or vomiting ?Anesthetic complications: no ? ? ?No notable events documented. ? ?Last Vitals:  ?Vitals:  ? 06/12/21 1245 06/12/21 1300  ?BP: 126/72 130/78  ?Pulse: 75 68  ?Resp: (!) 22 17  ?Temp:    ?SpO2: 100% 100%  ?  ?Last Pain:  ?Vitals:  ? 06/12/21 1218  ?TempSrc: Oral  ? ? ?  ?  ?  ?  ?  ?  ? ?Susan Lewis ? ? ? ? ?

## 2021-06-12 NOTE — Code Documentation (Signed)
Stroke Response Nurse Documentation ?Code Documentation ? ?Susan Lewis is a 61 y.o. female arriving to [facility]  via [facility] on 06/12/2021 with past medical hx of HTN, Chronic pain, glaucoma,Smoker.. On No antithrombotic. Code stroke was activated by EMS.  ? ?Patient from home where she was LKW at 2300 on 06/11/2021 and now complaining of Right side weakness and aphasia.  Per husband when they went to bed last night she was her normal self.  This morning when she woke up she had difficulty moving her right side and difficulty speaking. ? ?Stroke team at the bedside on patient arrival. Labs drawn and patient cleared for CT by Dr. Verdell Carmine. Patient to CT with team. NIHSS 13, see documentation for details and code stroke times. Patient with disoriented, right facial droop, right arm weakness, right leg weakness, right decreased sensation, Expressive aphasia , and dysarthria  on exam. The following imaging was completed:  CT Head, CTA, and CTP. Patient is not a candidate for IV Thrombolytic due to being outside the window. ?IR activation. ?Dr Leonie Man and Dr Estanislado Pandy obtained consent from her husband and son. ? ?Care Plan: ICU post IR ?.   ? ?Raliegh Ip  ?Stroke Response RN ?  ?

## 2021-06-12 NOTE — Consult Note (Addendum)
[facility] ? ?Reason for Consult: code stroke ?Referring Physician: Dr. Vanita Panda ? ?CC: code stroke ? ?History is obtained from: chart review, patient, EMS ? ?HPI: Susan Lewis is a 61 y.o. female with a past medical history of COPD, cataracts, and PTSD, who presents to the emergency department today as a code stroke.  The patient was last seen well by her husband at 23 PM last night.  At this time she was in her usual state of health.  At baseline she is independent for ADLs and IADLs.  She reportedly woke up at 5:30 in the morning today and was unable to get out of bed.  In trying to do so, she fell onto the floor.  Family noticed difficulty with patient communication and called EMS who noticed aphasia and right hemiparesis and called a code stroke in route.  On assessment upon arrival at the bridge, the patient had severe expressive aphasia with a right-sided hemiplegia.  With NIH stroke scale of 13.  Noncontrast CT scan of the head was obtained emergently which showed low-density in the like left insula compatible with acute stroke as well as old left parietal hypodensity compatible with a remote stroke.  The left middle cerebral artery also had hyperdense sign.  Aspect score was 7.  Patient underwent immediate CT angiogram of the brain and CT perfusion.  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  The likely left M2 occlusion CT perfusion showed core infarct of 22 mL with mismatch volume of 38 mL.  I discussed with patient's son and husband at the bedside risk-benefit of mechanical thrombectomy and they wanted to proceed.  Spoke to Dr. Estanislado Pandy from interventional neuroradiology who agreed with taking the patient for emergent mechanical thrombectomy.  Patient's husband denies any known prior history of stroke or carotid stenosis.  Patient was not on any blood thinners at  baseline ? ?LKW: 11 PM last night ?TNK given?: no, outside of window ?IR Thrombectomy? yes ?Modified Rankin Scale: 0-Completely asymptomatic and back to baseline post- stroke ?NIH stroke scale on admission 13 ?ROS: A complete ROS was performed and is negative except as noted in the HPI. ? ?Past Medical History:  ?Diagnosis Date  ? Anxiety   ? Arthritis   ? Bursitis   ? Carpal tunnel syndrome   ? COPD (chronic obstructive pulmonary disease) ([place])   ? Depression   ? Fibromyalgia   ? Fibromyalgia   ? GERD (gastroesophageal reflux disease)   ? Hemorrhoids 10/27/2014  ? Hyperlipidemia   ? Hypertension   ? Kidney stones   ? ? ? ?No family history on file. ? ? ?Social History:  ? reports that she has been smoking cigarettes. She started smoking about 49 years ago. She has a 45.00 pack-year smoking history. She has never used smokeless tobacco. She reports current drug use. Drug: Marijuana. She reports that she does not drink alcohol. ? ?Medications ? ?Current Facility-Administered Medications:  ?  aspirin 325 MG tablet, , , ,  ?  aspirin 81 MG chewable tablet, , , ,  ?  cangrelor (KENGREAL) 50 MG SOLR, , , ,  ?  ceFAZolin (ANCEF) 2-4 GM/100ML-% IVPB, , , ,  ?  clopidogrel (PLAVIX) 300 MG tablet, , , ,  ?  eptifibatide (INTEGRILIN) 20 MG/10ML injection, , , ,  ?  fentaNYL (SUBLIMAZE) 100 MCG/2ML injection, , , ,  ?  sodium chloride flush (NS) 0.9 % injection 3 mL, 3 mL, Intravenous, Once, Carmin Muskrat, MD ?  ticagrelor (BRILINTA) 90 MG tablet, , , ,  ?  tirofiban (AGGRASTAT) 5-0.9 MG/100ML-% injection, , , ,  ?  verapamil (ISOPTIN) 2.5 MG/ML injection, , , ,  ? ?Current Outpatient Medications:  ?  albuterol (VENTOLIN HFA) 108 (90 Base) MCG/ACT inhaler, INHALE 2 PUFFS INTO THE LUNGS EVERY 6 HOURS AS NEEDED FOR UP TO 30 DAYS FOR WHEEZING, Disp: , Rfl:  ?  atorvastatin (LIPITOR) 20 MG tablet, Take 1 tablet by mouth at bedtime., Disp: , Rfl:  ?  busPIRone (BUSPAR) 15 MG tablet, TAKE ONE TABLET BY MOUTH TWICE A DAY, Disp: 60  tablet, Rfl: 5 ?  diclofenac Sodium (VOLTAREN) 1 % GEL, Apply small amount sparingly to the top of the right foot twice daily, Disp: , Rfl:  ?  fluticasone (FLONASE) 50 MCG/ACT nasal spray, Place 1 spray into both nostrils daily., Disp: 9.9 mL, Rfl: 5 ?  gabapentin (NEURONTIN) 300 MG capsule, TAKE ONE CAPSULE BY MOUTH TWICE A DAY, Disp: 180 capsule, Rfl: 3 ?  meloxicam (MOBIC) 15 MG tablet, Take 1 tablet by mouth daily., Disp: , Rfl:  ?  omeprazole (PRILOSEC) 40 MG capsule, Take 40 mg by mouth at bedtime. , Disp: , Rfl: 0 ?  oxybutynin (DITROPAN) 5 MG tablet, Take 1 tablet (5 mg total) by mouth 3 (three) times daily., Disp: 90 tablet, Rfl: 2 ?  sertraline (ZOLOFT) 100 MG tablet, TAKE ONE AND ONE-HALF TABLETS BY MOUTH ONE TIME DAILY, Disp: 135 tablet, Rfl: 3 ?  traMADol (ULTRAM) 50 MG tablet, TAKE ONE TABLET BY MOUTH TWICE A DAY, Disp: 60 tablet, Rfl: 2 ?  traZODone (DESYREL) 150 MG tablet, Take 150 mg by mouth at bedtime. , Disp: , Rfl:  ? ?Facility-Administered Medications Ordered in Other Encounters:  ?  0.9 %  sodium chloride infusion, , Intravenous, Continuous PRN, Betha Loa, CRNA, New Bag at 06/12/21 0940 ?  ceFAZolin (ANCEF) IVPB 2 g/50 mL premix, , Intravenous, Anesthesia Intra-op, Rachael Darby T, CRNA, 2 g at 06/12/21 [id number] ?  fentaNYL citrate (PF) (SUBLIMAZE) injection, , Intravenous, Anesthesia Intra-op, Rachael Darby T, CRNA, 100 mcg at 06/12/21 [id number] ?  glycopyrrolate (ROBINUL) injection, , Intravenous, Anesthesia Intra-op, Rachael Darby T, CRNA, 0.2 mg at 06/12/21 [id number] ?  lidocaine 2% (20 mg/mL) 5 mL syringe, , Intravenous, Anesthesia Intra-op, Rachael Darby T, CRNA, 60 mg at 06/12/21 0948 ?  PHENYLephrine 40 mcg/ml in normal saline Adult IV Push Syringe (For Blood Pressure Support), , Intravenous, Anesthesia Intra-op, Rachael Darby T, CRNA, 120 mcg at 06/12/21 [id number] ?  propofol (DIPRIVAN) 10 mg/mL bolus/IV push, , Intravenous, Anesthesia Intra-op, Rachael Darby T, CRNA, 50 mg  at 06/12/21 0950 ?  rocuronium bromide 10 mg/mL (PF) syringe, , Intravenous, Anesthesia Intra-op, Rachael Darby T, CRNA, 50 mg at 06/12/21 0950 ?  succinylcholine (ANECTINE) syringe, , Intravenous, Anesthesia Intra-op, Rachael Darby T, CRNA, 80 mg at 06/12/21 [id number] ? ? ?Exam: ?Current vital signs: ?BP 137/86   Pulse 71   Temp 98.2 ?F (36.8 ?C) (Oral)   Resp 19   SpO2 99%  ?Vital signs in last 24 hours: ?Temp:  [98.2 ?F (36.8 ?C)] 98.2 ?F (36.8 ?C) (03/28 0950) ?Pulse Rate:  [71] 71 (03/28 0950) ?Resp:  [19] 19 (03/28 0950) ?BP: (137)/(86) 137/86 (03/28 0950) ?SpO2:  [99 %] 99 % (03/28 0950) ? ?GENERAL: Pleasant middle-age Caucasian lady awake, alert.  Appears frustrated due to her aphasia ?  Head: Normocephalic and atraumatic, without obvious abnormality ?EENT: Normal conjunctivae, dry mucous membranes, no OP obstruction ?LUNGS: Normal respiratory effort. Non-labored breathing on room air ?CV: Regular rate and rhythm on telemetry ?Extremities: warm, well perfused, without obvious deformity ? ?NEURO:  ?Mental Status: Awake, alert, unable to answer orientation questions due to severe expressive aphasia but able to understand and follows most commands.  Speaks occasional words with great difficulty but unable to speak sentences ?Naming is significantly impaired as is repetition; comprehension intact  ?Cranial Nerves:  ?II: PERRL 3 mm/brisk. visual fields full.  ?III, IV, VI: EOMI. Lid elevation symmetric and full.  ?V: Sensation is intact to light touch and symmetrical to face. Blinks to threat. Moves jaw back and forth.  ?VII: mild right lower facial droop ?VIII: Hearing intact to voice ?IX, X: untested ?XI: untested ?XII: untested   ?Motor: RUE flaccid with 1/5, and trace withdrawal to pain.  RLE 2/5, LUE 5/5, LLE 5/5 ?Sensation: severe R sided deficit ?Coordination: FTN intact with L hand, not with right, heel to shin intact with left leg, not right.  ?Gait: Deferred ? ?NIHSS: ?1a Level of Conscious.: 0  ?1b  LOC Questions: 2 ?1c LOC Commands: 0  ?2 Best Gaze: 0  ?3 Visual: 0 ?4 Facial Palsy: 1 ?5a Motor Arm - left: 0 ?5b Motor Arm - Right: 3 ?6a Motor Leg - Left: 0 ?6b Motor Leg - Right: 2  ?7 Limb Ataxia: 0  ?8 S

## 2021-06-12 NOTE — Progress Notes (Signed)
Pt belongings: ?Cellphone ?Charger ?Nightgown ?Underwear ?Silver ring with opal stone on left ring finger ? ?Medications sent home with family.  ? ?Jammal Sarr, [place] Scrape, RN   ?

## 2021-06-13 ENCOUNTER — Inpatient Hospital Stay (HOSPITAL_COMMUNITY): Payer: 59

## 2021-06-13 ENCOUNTER — Encounter (HOSPITAL_COMMUNITY): Payer: Self-pay | Admitting: Certified Registered Nurse Anesthetist

## 2021-06-13 DIAGNOSIS — I6389 Other cerebral infarction: Secondary | ICD-10-CM

## 2021-06-13 DIAGNOSIS — I639 Cerebral infarction, unspecified: Secondary | ICD-10-CM | POA: Diagnosis not present

## 2021-06-13 DIAGNOSIS — I63412 Cerebral infarction due to embolism of left middle cerebral artery: Secondary | ICD-10-CM | POA: Diagnosis not present

## 2021-06-13 LAB — CBC WITH DIFFERENTIAL/PLATELET
Abs Immature Granulocytes: 0.08 K/uL — ABNORMAL HIGH (ref 0.00–0.07)
Basophils Absolute: 0 K/uL (ref 0.0–0.1)
Basophils Relative: 0 %
Eosinophils Absolute: 0 K/uL (ref 0.0–0.5)
Eosinophils Relative: 0 %
HCT: 33.2 % — ABNORMAL LOW (ref 36.0–46.0)
Hemoglobin: 10.9 g/dL — ABNORMAL LOW (ref 12.0–15.0)
Immature Granulocytes: 0 %
Lymphocytes Relative: 8 %
Lymphs Abs: 1.6 K/uL (ref 0.7–4.0)
MCH: 29.7 pg (ref 26.0–34.0)
MCHC: 32.8 g/dL (ref 30.0–36.0)
MCV: 90.5 fL (ref 80.0–100.0)
Monocytes Absolute: 0.9 K/uL (ref 0.1–1.0)
Monocytes Relative: 5 %
Neutro Abs: 16.3 K/uL — ABNORMAL HIGH (ref 1.7–7.7)
Neutrophils Relative %: 87 %
Platelets: 185 K/uL (ref 150–400)
RBC: 3.67 MIL/uL — ABNORMAL LOW (ref 3.87–5.11)
RDW: 14.1 % (ref 11.5–15.5)
WBC: 18.9 K/uL — ABNORMAL HIGH (ref 4.0–10.5)
nRBC: 0 % (ref 0.0–0.2)

## 2021-06-13 LAB — HEMOGLOBIN A1C
Hgb A1c MFr Bld: 5.1 % (ref 4.8–5.6)
Mean Plasma Glucose: 99.67 mg/dL

## 2021-06-13 LAB — BASIC METABOLIC PANEL
Anion gap: 8 (ref 5–15)
BUN: 12 mg/dL (ref 8–23)
CO2: 24 mmol/L (ref 22–32)
Calcium: 8.5 mg/dL — ABNORMAL LOW (ref 8.9–10.3)
Chloride: 109 mmol/L (ref 98–111)
Creatinine, Ser: 0.76 mg/dL (ref 0.44–1.00)
GFR, Estimated: >60 mL/min (ref >60)
Glucose, Bld: 99 mg/dL (ref 70–99)
Potassium: 2.8 mmol/L — ABNORMAL LOW (ref 3.5–5.1)
Sodium: 141 mmol/L (ref 135–145)

## 2021-06-13 LAB — ECHOCARDIOGRAM COMPLETE BUBBLE STUDY
AR max vel: 1.69 cm2
AV Area VTI: 1.9 cm2
AV Area mean vel: 1.67 cm2
AV Mean grad: 15 mmHg
AV Peak grad: 27 mmHg
Ao pk vel: 2.6 m/s
S' Lateral: 2.8 cm

## 2021-06-13 LAB — LIPID PANEL
Cholesterol: 111 mg/dL (ref 0–200)
HDL: 31 mg/dL — ABNORMAL LOW
LDL Cholesterol: 51 mg/dL (ref 0–99)
Total CHOL/HDL Ratio: 3.6 ratio
Triglycerides: 145 mg/dL
VLDL: 29 mg/dL (ref 0–40)

## 2021-06-13 LAB — TRIGLYCERIDES: Triglycerides: 136 mg/dL (ref <150)

## 2021-06-13 MED ORDER — POTASSIUM CHLORIDE 20 MEQ PO PACK: 40.0000 meq | PACK | Freq: Two times a day (BID) | ORAL | Status: DC

## 2021-06-13 MED ORDER — DIPHENHYDRAMINE HCL 50 MG/ML IJ SOLN
25.0000 mg | Freq: Once | INTRAMUSCULAR | Status: AC
Start: 2021-06-13 — End: 2021-06-13
  Administered 2021-06-13: 25 mg via INTRAVENOUS
  Filled 2021-06-13: qty 1

## 2021-06-13 MED ORDER — SERTRALINE HCL 100 MG PO TABS
150.0000 mg | ORAL_TABLET | Freq: Every day | ORAL | Status: DC
Start: 2021-06-13 — End: 2021-06-15
  Administered 2021-06-13 – 2021-06-15 (×3): 150 mg via ORAL
  Filled 2021-06-13: qty 3
  Filled 2021-06-13: qty 1
  Filled 2021-06-13: qty 3

## 2021-06-13 MED ORDER — CHLORHEXIDINE GLUCONATE CLOTH 2 % EX PADS
6.0000 | MEDICATED_PAD | Freq: Every day | CUTANEOUS | Status: DC
  Administered 2021-06-13: 6 via TOPICAL

## 2021-06-13 MED ORDER — HYDRALAZINE HCL 20 MG/ML IJ SOLN
10.0000 mg | Freq: Once | INTRAMUSCULAR | Status: AC
  Administered 2021-06-13: 10 mg via INTRAVENOUS
  Filled 2021-06-13: qty 1

## 2021-06-13 MED ORDER — TRAZODONE HCL 50 MG PO TABS
150.0000 mg | ORAL_TABLET | Freq: Every day | ORAL | Status: DC
  Administered 2021-06-13 – 2021-06-14 (×2): 150 mg via ORAL
  Filled 2021-06-13: qty 3
  Filled 2021-06-13: qty 1

## 2021-06-13 MED ORDER — LORAZEPAM 2 MG/ML IJ SOLN
1.0000 mg | Freq: Once | INTRAMUSCULAR | Status: AC | PRN
  Administered 2021-06-13: 1 mg via INTRAVENOUS
  Filled 2021-06-13: qty 1

## 2021-06-13 MED ORDER — ORAL CARE MOUTH RINSE
15.0000 mL | Freq: Two times a day (BID) | OROMUCOSAL | Status: DC
  Administered 2021-06-13 – 2021-06-15 (×3): 15 mL via OROMUCOSAL

## 2021-06-13 MED ORDER — POTASSIUM CHLORIDE 10 MEQ/100ML IV SOLN
10.0000 meq | INTRAVENOUS | Status: DC
  Administered 2021-06-13: 10 meq via INTRAVENOUS
  Filled 2021-06-13 (×2): qty 100

## 2021-06-13 MED ORDER — POTASSIUM CHLORIDE 20 MEQ PO PACK
40.0000 meq | PACK | Freq: Once | ORAL | Status: AC
  Administered 2021-06-13: 40 meq via ORAL
  Filled 2021-06-13: qty 2

## 2021-06-13 MED ORDER — BUSPIRONE HCL 10 MG PO TABS
15.0000 mg | ORAL_TABLET | Freq: Two times a day (BID) | ORAL | Status: DC
  Administered 2021-06-13 – 2021-06-15 (×5): 15 mg via ORAL
  Filled 2021-06-13: qty 2
  Filled 2021-06-13 (×3): qty 1
  Filled 2021-06-13: qty 2

## 2021-06-13 MED ORDER — HALOPERIDOL LACTATE 5 MG/ML IJ SOLN
1.0000 mg | Freq: Once | INTRAMUSCULAR | Status: DC | PRN
  Filled 2021-06-13: qty 1

## 2021-06-13 MED ORDER — HYDROXYZINE HCL 25 MG PO TABS
25.0000 mg | ORAL_TABLET | Freq: Three times a day (TID) | ORAL | Status: DC | PRN
  Administered 2021-06-13: 25 mg via ORAL
  Filled 2021-06-13: qty 1

## 2021-06-13 MED ORDER — AMLODIPINE BESYLATE 5 MG PO TABS
5.0000 mg | ORAL_TABLET | Freq: Every day | ORAL | Status: DC
  Administered 2021-06-14 – 2021-06-15 (×2): 5 mg via ORAL
  Filled 2021-06-13 (×3): qty 1

## 2021-06-13 MED ORDER — TRAMADOL HCL 50 MG PO TABS
50.0000 mg | ORAL_TABLET | Freq: Two times a day (BID) | ORAL | Status: DC
  Administered 2021-06-13 – 2021-06-15 (×5): 50 mg via ORAL
  Filled 2021-06-13 (×5): qty 1

## 2021-06-13 NOTE — Evaluation (Signed)
Occupational Therapy Evaluation ?Patient Details ?Name: Susan Lewis ?MRN: [id number] ?DOB: 01/18/1961 ?Today's Date: 06/13/2021 ? ? ?History of Present Illness 61 y.o. female presents [facility] on [facility]/12/2021 with R weakness and impaired communication. 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 Pt underwent revascularization of L MCA and ICA with stent assisted angioplasty on 3/28. PMH includes COPD, cataracts, HTN, HLD, depression, and PTSD.  ? ?Clinical Impression ?  ?Patient admitted for the diagnosis above.  PTA she lives with her spouse, and worked as a Conservation officer, naturecashier.  She continues to drive, and needed no assist with ADL/IADL or mobility.  Deficits impacting independence are listed below.  Currently she is needing up to Mod A for upper body ADL, Max A for lower body ADL, and Min A for basic transfers.  OT will follow in the acute setting, with AIR being recommended for post acute rehab given age, ability to participate, motivation and prior level of function.   ?   ? ?Recommendations for follow up therapy are one component of a multi-disciplinary discharge planning process, led by the attending physician.  Recommendations may be updated based on patient status, additional functional criteria and insurance authorization.  ? ?Follow Up Recommendations ? Acute inpatient rehab (3hours/day)  ?  ?Assistance Recommended at Discharge Frequent or constant Supervision/Assistance  ?Patient can return home with the following A little help with walking and/or transfers;A lot of help with bathing/dressing/bathroom;Help with stairs or ramp for entrance;Assist for transportation;Assistance with cooking/housework;Direct supervision/assist for financial management;Direct supervision/assist for medications management ? ?  ?Functional Status Assessment ? Patient has had a recent decline in their functional status and demonstrates the ability to make significant improvements in function in a reasonable and predictable  amount of time.  ?Equipment Recommendations ? Tub/shower seat;BSC/3in1  ?  ?Recommendations for Other Services   ? ? ?  ?Precautions / Restrictions Precautions ?Precautions: Fall ?Restrictions ?Weight Bearing Restrictions: No  ? ?  ? ?Mobility Bed Mobility ?Overal bed mobility: Needs Assistance ?Bed Mobility: Supine to Sit ?  ?  ?Supine to sit: Min guard, HOB elevated ?  ?  ?  ?  ? ?Transfers ?Overall transfer level: Needs assistance ?Equipment used: 1 person hand held assist ?Transfers: Sit to/from Stand, Bed to chair/wheelchair/BSC ?Sit to Stand: Min assist ?  ?  ?Step pivot transfers: Min assist ?  ?  ?  ?  ? ?  ?Balance Overall balance assessment: Needs assistance ?Sitting-balance support: No upper extremity supported, Feet supported ?Sitting balance-Leahy Scale: Fair ?Sitting balance - Comments: will lean forward to pick up items/impulsive ?Postural control: Right lateral lean ?Standing balance support: During functional activity, Single extremity supported ?Standing balance-Leahy Scale: Poor ?  ?  ?  ?  ?  ?  ?  ?  ?  ?  ?  ?  ?   ? ?ADL either performed or assessed with clinical judgement  ? ?ADL Overall ADL's : Needs assistance/impaired ?  ?  ?Grooming: Wash/dry hands;Wash/dry face;Min guard;Sitting ?  ?Upper Body Bathing: Moderate assistance;Sitting ?  ?Lower Body Bathing: Maximal assistance;Sitting/lateral leans ?  ?Upper Body Dressing : Moderate assistance;Sitting ?  ?Lower Body Dressing: Maximal assistance;Sit to/from stand ?  ?  ?  ?  ?  ?  ?  ?Functional mobility during ADLs: Minimal assistance ?   ? ? ? ?Vision Baseline Vision/History: 1 Wears glasses ?Vision Assessment?: Vision impaired- to be further tested in functional context ?Additional Comments: Patient with difficulty tracking right all quadrants.  Blinks to threat.  ?   ?  Perception Perception ?Perception: Impaired ?Inattention/Neglect: Does not attend to right visual field ?  ?Praxis   ?  ? ?Pertinent Vitals/Pain Pain Assessment ?Pain  Assessment: No/denies pain  ? ? ? ?Hand Dominance Right ?  ?Extremity/Trunk Assessment Upper Extremity Assessment ?Upper Extremity Assessment: RUE deficits/detail ?RUE Deficits / Details: general 2-/5 to R UE MMT.  Weak gross grasp, unable to show one or two fingers, difficulty with in hand manipulation.  Can bring hand to mouth. ?RUE Sensation: decreased light touch;decreased proprioception ?RUE Coordination: decreased gross motor;decreased fine motor ?  ?Lower Extremity Assessment ?Lower Extremity Assessment: Defer to PT evaluation ?  ?Cervical / Trunk Assessment ?Cervical / Trunk Assessment: Normal ?  ?Communication Communication ?Communication: Expressive difficulties ?  ?Cognition Arousal/Alertness: Awake/alert ?Behavior During Therapy: Restless, Impulsive ?Overall Cognitive Status: Impaired/Different from baseline ?Area of Impairment: Awareness, Safety/judgement ?  ?  ?  ?  ?  ?  ?  ?  ?  ?  ?  ?  ?Safety/Judgement: Decreased awareness of safety, Decreased awareness of deficits ?Awareness: Emergent ?  ?  ?  ?  ?General Comments   VSS on RA ? ?  ?Exercises   ?  ?Shoulder Instructions    ? ? ?Home Living Family/patient expects to be discharged to:: Private residence ?Living Arrangements: Spouse/significant other;Children ?Available Help at Discharge: Family;Available 24 hours/day ?Type of Home: House ?Home Access: Stairs to enter ?Entrance Stairs-Number of Steps: 4 ?Entrance Stairs-Rails: Can reach both ?Home Layout: One level ?  ?  ?Bathroom Shower/Tub: Tub/shower unit ?  ?Bathroom Toilet: Standard ?  ?  ?Home Equipment: None ?  ?  ?  ? ?  ?Prior Functioning/Environment Prior Level of Function : Independent/Modified Independent;Working/employed;Driving ?  ?  ?  ?  ?  ?  ?  ?  ?  ? ?  ?  ?OT Problem List: Decreased strength;Decreased range of motion;Decreased activity tolerance;Impaired balance (sitting and/or standing);Impaired vision/perception;Decreased knowledge of use of DME or AE;Decreased safety  awareness;Decreased coordination;Impaired UE functional use ?  ?   ?OT Treatment/Interventions: Self-care/ADL training;Therapeutic exercise;Therapeutic activities;DME and/or AE instruction;Patient/family education;Balance training  ?  ?OT Goals(Current goals can be found in the care plan section) Acute Rehab OT Goals ?OT Goal Formulation: Patient unable to participate in goal setting ?Time For Goal Achievement: 06/20/21 ?Potential to Achieve Goals: Good ?ADL Goals ?Pt Will Perform Grooming: with supervision;standing ?Pt Will Perform Upper Body Bathing: with set-up;sitting ?Pt Will Perform Lower Body Bathing: with min assist;sit to/from stand ?Pt Will Perform Upper Body Dressing: with supervision;sitting ?Pt Will Perform Lower Body Dressing: with min assist;sit to/from stand ?Pt Will Transfer to Toilet: with min guard assist;ambulating;regular height toilet ?Pt/caregiver will Perform Home Exercise Program: Right Upper extremity;With minimal assist;Increased strength  ?OT Frequency: Min 2X/week ?  ? ?Co-evaluation   ?  ?  ?  ?  ? ?  ?AM-PAC OT "6 Clicks" Daily Activity     ?Outcome Measure Help from another person eating meals?: A Little ?Help from another person taking care of personal grooming?: A Little ?Help from another person toileting, which includes using toliet, bedpan, or urinal?: A Lot ?Help from another person bathing (including washing, rinsing, drying)?: A Lot ?Help from another person to put on and taking off regular upper body clothing?: A Lot ?Help from another person to put on and taking off regular lower body clothing?: A Lot ?6 Click Score: 14 ?  ?End of Session Equipment Utilized During Treatment: Gait belt ?Nurse Communication: Mobility status ? ?Activity Tolerance: Patient tolerated  treatment well ?Patient left: in chair;with call bell/phone within reach;with chair alarm set ? ?OT Visit Diagnosis: Unsteadiness on feet (R26.81);Muscle weakness (generalized) (M62.81);Other symptoms and signs  involving cognitive function;Hemiplegia and hemiparesis ?Hemiplegia - Right/Left: Right ?Hemiplegia - dominant/non-dominant: Dominant ?Hemiplegia - caused by: Cerebral infarction  ?              ?Time: 1610-9604 ?OT Time Cal

## 2021-06-13 NOTE — Progress Notes (Signed)
Inpatient Rehab Admissions Coordinator Note:  ? ?Per therapy recommendations patient was screened for CIR candidacy by Michel Santee, PT. At this time, pt appears to be a potential candidate for CIR. I will place an order for rehab consult for full assessment, per our protocol.  Please contact me any with questions.. ? ?Shann Medal, PT, DPT ?[phone] ?06/13/21 ?1:10 PM  ?

## 2021-06-13 NOTE — Progress Notes (Signed)
Pt w/ art line great than 120-140 asymptomatic. Dr. Derry Lory informed. See new order ?

## 2021-06-13 NOTE — Evaluation (Signed)
Physical Therapy Evaluation ?Patient Details ?Name: Susan Lewis ?MRN: [id number] ?DOB: [birth year] ?Today's Date: 06/13/2021 ? ?History of Present Illness ? 61 y.o. female presents to [facility] on 06/12/2021 with R weakness and impaired communication. 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 Pt underwent revascularization of L MCA and ICA with stent assisted angioplasty on 3/28. PMH includes COPD, cataracts, HTN, HLD, depression, and PTSD.  ?Clinical Impression ? Pt presents to PT with deficits in functional mobility, gait, balance, power, coordination, sensation, communication. Pt demonstrates impaired coordination of BUE as well as RLE, also reporting reduced sensation in RUE at this time. Pt with multiple losses of balance when transferring and ambulating for limited distances, requiring PT cues to slow pace to reduce falls risk. Pt will benefit from continued aggressive mobilization in an effort to reduce falls risk and restore independence.   ?   ? ?Recommendations for follow up therapy are one component of a multi-disciplinary discharge planning process, led by the attending physician.  Recommendations may be updated based on patient status, additional functional criteria and insurance authorization. ? ?Follow Up Recommendations Acute inpatient rehab (3hours/day) ? ?  ?Assistance Recommended at Discharge Intermittent Supervision/Assistance  ?Patient can return home with the following ? A lot of help with walking and/or transfers;A lot of help with bathing/dressing/bathroom;Assistance with cooking/housework;Assistance with feeding;Direct supervision/assist for financial management;Direct supervision/assist for medications management;Help with stairs or ramp for entrance;Assist for transportation ? ?  ?Equipment Recommendations  (TBD)  ?Recommendations for Other Services ? Rehab consult  ?  ?Functional Status Assessment Patient has had a recent decline in their functional status and demonstrates  the ability to make significant improvements in function in a reasonable and predictable amount of time.  ? ?  ?Precautions / Restrictions Precautions ?Precautions: Fall ?Restrictions ?Weight Bearing Restrictions: No  ? ?  ? ?Mobility ? Bed Mobility ?Overal bed mobility: Needs Assistance ?Bed Mobility: Supine to Sit ?  ?  ?Supine to sit: Supervision ?  ?  ?  ?  ? ?Transfers ?Overall transfer level: Needs assistance ?Equipment used: None ?Transfers: Sit to/from Stand, Bed to chair/wheelchair/BSC ?Sit to Stand: Min assist ?  ?Step pivot transfers: Min assist ?  ?  ?  ?  ?  ? ?Ambulation/Gait ?Ambulation/Gait assistance: Mod assist, Min assist ?Gait Distance (Feet): 10 Feet (10' x 2) ?Assistive device: None ?Gait Pattern/deviations: Step-to pattern ?Gait velocity: reduced ?Gait velocity interpretation: <1.8 ft/sec, indicate of risk for recurrent falls ?  ?General Gait Details: pt with slowed step-to gait, mild R foot drag. PT cues for slowed gait speed which results in improved stability. Pt with one posterior loss of balance during first trial, and one lateral loss of balance during 2nd ? ?Stairs ?  ?  ?  ?  ?  ? ?Wheelchair Mobility ?  ? ?Modified Rankin (Stroke Patients Only) ?Modified Rankin (Stroke Patients Only) ?Pre-Morbid Rankin Score: No symptoms ?Modified Rankin: Moderately severe disability ? ?  ? ?Balance Overall balance assessment: Needs assistance ?Sitting-balance support: No upper extremity supported, Feet supported ?Sitting balance-Leahy Scale: Fair ?  ?  ?Standing balance support: No upper extremity supported, During functional activity ?Standing balance-Leahy Scale: Poor ?Standing balance comment: minA ?  ?  ?  ?  ?  ?  ?  ?  ?  ?  ?  ?   ? ? ? ?Pertinent Vitals/Pain Pain Assessment ?Pain Assessment: No/denies pain  ? ? ?Home Living Family/patient expects to be discharged to:: Private residence ?Living Arrangements: Spouse/significant other;Children ?Available  Help at Discharge: Family;Available 24  hours/day ?Type of Home: House ?Home Access: Stairs to enter ?Entrance Stairs-Rails: Can reach both ?Entrance Stairs-Number of Steps: 4 ?  ?Home Layout: One level ?Home Equipment: None ?   ?  ?Prior Function Prior Level of Function : Independent/Modified Independent;Working/employed;Driving ?  ?  ?  ?  ?  ?  ?Mobility Comments: works as a Scientist, water quality ?  ?  ? ? ?Hand Dominance  ? Dominant Hand: Right ? ?  ?Extremity/Trunk Assessment  ? Upper Extremity Assessment ?Upper Extremity Assessment: RUE deficits/detail;LUE deficits/detail ?RUE Sensation: decreased light touch ?RUE Coordination: decreased fine motor ?LUE Coordination: decreased fine motor ?  ? ?Lower Extremity Assessment ?Lower Extremity Assessment: RLE deficits/detail ?RLE Coordination: decreased gross motor ?  ? ?Cervical / Trunk Assessment ?Cervical / Trunk Assessment: Normal  ?Communication  ? Communication: Expressive difficulties  ?Cognition Arousal/Alertness: Awake/alert ?Behavior During Therapy: Anxious (emotional lability) ?Overall Cognitive Status: Difficult to assess ?  ?  ?  ?  ?  ?  ?  ?  ?  ?  ?  ?  ?  ?  ?  ?  ?General Comments: pt with significant expressive aphasia, does better with yes/no questions due to word finding difficulties. Pt is oriented to place, situation, person. Pt follows commands well, demonstrates reduced awareness of deficits. ?  ?  ? ?  ?General Comments General comments (skin integrity, edema, etc.): VSS on RA, pt with anxiety during session but responds well with PT cues to reduce RR ? ?  ?Exercises    ? ?Assessment/Plan  ?  ?PT Assessment Patient needs continued PT services  ?PT Problem List Decreased strength;Decreased activity tolerance;Decreased balance;Decreased mobility;Decreased coordination;Decreased cognition;Decreased safety awareness;Decreased knowledge of precautions;Impaired sensation ? ?   ?  ?PT Treatment Interventions DME instruction;Gait training;Stair training;Functional mobility training;Therapeutic  activities;Therapeutic exercise;Balance training;Neuromuscular re-education;Patient/family education   ? ?PT Goals (Current goals can be found in the Care Plan section)  ?Acute Rehab PT Goals ?Patient Stated Goal: to return to independence ?PT Goal Formulation: With patient ?Time For Goal Achievement: 06/27/21 ?Potential to Achieve Goals: Fair ? ?  ?Frequency Min 4X/week ?  ? ? ?Co-evaluation   ?  ?  ?  ?  ? ? ?  ?AM-PAC PT "6 Clicks" Mobility  ?Outcome Measure Help needed turning from your back to your side while in a flat bed without using bedrails?: A Little ?Help needed moving from lying on your back to sitting on the side of a flat bed without using bedrails?: A Little ?Help needed moving to and from a bed to a chair (including a wheelchair)?: A Little ?Help needed standing up from a chair using your arms (e.g., wheelchair or bedside chair)?: A Little ?Help needed to walk in hospital room?: Total ?  ?6 Click Score: 13 ? ?  ?End of Session   ?Activity Tolerance: Patient tolerated treatment well ?Patient left: in chair;with call bell/phone within reach;with chair alarm set ?Nurse Communication: Mobility status ?PT Visit Diagnosis: Other abnormalities of gait and mobility (R26.89);Muscle weakness (generalized) (M62.81);Other symptoms and signs involving the nervous system (R29.898) ?  ? ?Time: [id number] ?PT Time Calculation (min) (ACUTE ONLY): 34 min ? ? ?Charges:   PT Evaluation ?$PT Eval Moderate Complexity: 1 Mod ?  ?  ?   ? ? ?Zenaida Niece, PT, DPT ?Acute Rehabilitation ?Pager: [phone] ?Office [phone] ? ? ?Zenaida Niece ?06/13/2021, 9:46 AM ?

## 2021-06-13 NOTE — Progress Notes (Signed)
? ? ?Referring Physician(s): CODE STROKE ? ?Supervising Physician: Julieanne Cotton ? ?Patient Status:  [facility] - In-pt ? 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 Corliss Skains ? ?Subjective: Alert and oriented; no discomfort or pain observed. Word-finding difficulty that was frustrating to the patient. Patient became tearful when discussing the stroke.  ? ?Allergies: ?Morphine and Codeine ? ?Medications: ?Prior to Admission medications   ?Medication Sig Start Date End Date Taking? Authorizing Provider  ?atorvastatin (LIPITOR) 20 MG tablet Take 20 mg by mouth at bedtime. 05/19/20  Yes [provider]  ?azelastine (ASTELIN) 0.1 % nasal spray Place 1 spray into both nostrils 2 (two) times daily. Use in each nostril as directed   Yes [provider]  ?busPIRone (BUSPAR) 15 MG tablet TAKE ONE TABLET BY MOUTH TWICE A DAY ?Patient taking differently: Take 15 mg by mouth 2 (two) times daily. 08/22/20  Yes Mliss Sax, MD  ?gabapentin (NEURONTIN) 300 MG capsule TAKE ONE CAPSULE BY MOUTH TWICE A DAY ?Patient taking differently: Take 300 mg by mouth 2 (two) times daily. 05/15/21  Yes Loyola Mast, MD  ?meloxicam (MOBIC) 15 MG tablet Take 15 mg by mouth daily. 05/18/20  Yes [provider]  ?omeprazole (PRILOSEC) 40 MG capsule Take 40 mg by mouth daily. 06/10/16  Yes [provider]  ?oxybutynin (DITROPAN) 5 MG tablet Take 1 tablet (5 mg total) by mouth 3 (three) times daily. 02/27/21  Yes Loyola Mast, MD  ?sertraline (ZOLOFT) 100 MG tablet TAKE ONE AND ONE-HALF TABLETS BY MOUTH ONE TIME DAILY ?Patient taking differently: Take 150 mg by mouth daily. 05/04/21  Yes Loyola Mast, MD  ?solifenacin (VESICARE) 5 MG tablet Take 5 mg by mouth daily.   Yes [provider]  ?traMADol (ULTRAM) 50 MG tablet TAKE ONE TABLET  BY MOUTH TWICE A DAY ?Patient taking differently: Take 50 mg by mouth 2 (two) times daily. 05/15/21  Yes Loyola Mast, MD  ?traZODone (DESYREL) 150 MG tablet Take 150 mg by mouth at bedtime.  06/04/16  Yes [provider]  ?albuterol (VENTOLIN HFA) 108 (90 Base) MCG/ACT inhaler INHALE 2 PUFFS INTO THE LUNGS EVERY 6 HOURS AS NEEDED FOR UP TO 30 DAYS FOR WHEEZING ?Patient not taking: Reported on 06/12/2021 09/21/18   [provider]  ?fluticasone (FLONASE) 50 MCG/ACT nasal spray Place 1 spray into both nostrils daily. ?Patient not taking: Reported on 06/12/2021 04/23/21   Loyola Mast, MD  ? ? ? ?Vital Signs: ?BP 127/60   Pulse 68   Temp 98.8 ?F (37.1 ?C) (Oral)   Resp 20   Ht 5\' 5"  (1.651 m)   Wt 142 lb 10.2 oz (64.7 kg)   SpO2 96%   BMI 23.74 kg/m?  ? ?Physical Exam ?Constitutional:   ?   General: She is not in acute distress. ?   Appearance: She is not ill-appearing.  ?HENT:  ?   Mouth/Throat:  ?   Mouth: Mucous membranes are dry.  ?   Pharynx: Oropharynx is clear.  ?Eyes:  ?   General: No visual field deficit. ?Cardiovascular:  ?   Comments: Right groin vascular site is soft, dry and non-tender.  ?Pulmonary:  ?   Effort: Pulmonary effort is normal.  ?Musculoskeletal:  ?   Right lower leg: No edema.  ?   Left lower leg: No edema.  ?Skin: ?  General: Skin is warm and dry.  ?Neurological:  ?   Mental Status: She is alert.  ?   Cranial Nerves: No facial asymmetry.  ?   Motor: No tremor.  ?   Comments: Bilateral strength 4/5 for both upper and lower extremities. She is able to follow all commands. Moderate difficulty with word finding. She was able to verbalize quite a lot but was unable to say where she was, the time, etc. She stated she knows the words but can't say them. She appears to be alert and oriented to person/place/time and situation.   ? ? ?Imaging: ?CT HEAD WO CONTRAST (5MM) ? ?Result Date: 06/12/2021 ?CLINICAL DATA:  Stroke, follow-up, status post percutaneous intervention EXAM: CT  HEAD WITHOUT CONTRAST TECHNIQUE: Contiguous axial images were obtained from the base of the skull through the vertex without intravenous contrast. RADIATION DOSE REDUCTION: This exam was performed according to the departmental dose-optimization program which includes automated exposure control, adjustment of the mA and/or kV according to patient size and/or use of iterative reconstruction technique. COMPARISON:  CT head 06/12/2021 9:15 a.m, CT head from thrombectomy 06/12/2021 11:30 a.m. FINDINGS: Brain: Hyperdensity in the left MCA territory (series 3, images 19-24), which may represent petechial hemorrhage versus post thrombectomy contrast staining, which appears similar to the CT done immediately after intervention. Along the course of the left MCA, particularly the M1 and M2 segments, there is hyperdensity, which may represent subarachnoid hemorrhage versus contrast extravasation from the left MCA, which also appears similar to the CT done immediately after intervention. No new area of hypodensity to suggest acute infarct. No mass, mass effect, or midline shift. No hydrocephalus. Vascular: No hyperdense vessel. Skull: Normal. Negative for fracture or focal lesion. Sinuses/Orbits: No acute finding. Other: Trace fluid in left mastoid air cells. IMPRESSION: Status post thrombectomy, with hyperdensity in the left MCA territory, favored to represent post thrombectomy contrast staining although petechial hemorrhage could appear similar. Additional hyperdensity along the course of the left M1 and M2, which may represent contrast extravasation from the left MCA versus subarachnoid hemorrhage. These areas of hyperdensity appear similar to the CT done immediately after intervention. Electronically Signed   By: Wiliam KeAlison  Vasan M.D.   On: 06/12/2021 15:55  ? ?DG CHEST PORT 1 VIEW ? ?Result Date: 06/12/2021 ?CLINICAL DATA:  COPD, ETT placement EXAM: PORTABLE CHEST 1 VIEW COMPARISON:  12/29/2020 FINDINGS: Endotracheal tube with  the tip 2.6 cm above the carina. Nasogastric tube coursing below the diaphragm. Left basilar airspace disease likely reflecting atelectasis versus pneumonia. No focal consolidation. No pleural effusion or pneumothorax. Heart and mediastinal contours are unremarkable. No acute osseous abnormality. IMPRESSION: 1. Endotracheal tube with the tip 2.6 cm above the carina. Nasogastric tube coursing below the diaphragm. 2. Left basilar atelectasis versus pneumonia. Electronically Signed   By: Elige KoHetal  Patel M.D.   On: 06/12/2021 12:58  ? ?CT HEAD CODE STROKE WO CONTRAST ? ?Result Date: 06/12/2021 ?CLINICAL DATA:  Code stroke.  61 year old female EXAM: CT HEAD WITHOUT CONTRAST TECHNIQUE: Contiguous axial images were obtained from the base of the skull through the vertex without intravenous contrast. RADIATION DOSE REDUCTION: This exam was performed according to the departmental dose-optimization program which includes automated exposure control, adjustment of the mA and/or kV according to patient size and/or use of iterative reconstruction technique. COMPARISON:  Head CT 05/30/2019. FINDINGS: Brain: New but chronic appearing cortical encephalomalacia in the posterior left MCA territory since 2021. Associated subcortical white matter gliosis. However, superimposed asymmetric loss of the normal gray-white  matter differentiation at the left frontal operculum which seems to spare the temporal operculum (coronal image 32 and series 2, image 16). Left insula is involved. Left lentiform is involved. Left caudate and internal capsule spared. No superimposed No midline shift, mass effect, or evidence of intracranial mass lesion. No ventriculomegaly. No acute intracranial hemorrhage identified. Right side and posterior fossa gray-white matter differentiation is stable and within normal limits. Vascular: Calcified atherosclerosis at the skull base. Suspicious hyperdense left MCA branch in the sylvian fissure on series 2, image 12. Skull:  Intact, negative. Sinuses/Orbits: Visualized paranasal sinuses and mastoids are stable and well aerated. Other: Mild retained secretions in the nasopharynx. Visualized orbits and scalp soft tissues are withi

## 2021-06-13 NOTE — Progress Notes (Signed)
Pt w/ anxiety at this time which is causing Bps to increase. Dr. Derry Lory informed. ?

## 2021-06-13 NOTE — Progress Notes (Addendum)
? ?NAME:  Susan Lewis, MRN:  [id number], DOB:  [birth year], LOS: 1 ?ADMISSION DATE:  06/12/2021, CONSULTATION DATE:  06/12/21 ?REFERRING MD:  Dr Leonie Man, CHIEF COMPLAINT:  Stroke  ? ?History of Present Illness:  ?Patient is a 61 year old female with PMHx as stated below presenting with right sided weakness and aphasia. History obtained via chart review. Patient's [id number] on 06/11/2020 and awoke with difficulty moving her right side and difficulty speaking. ?Code Stroke activated on arrival. CT Head, CTA and CTP obtained. CT Head w acute on chronic left MCA territory ischemia with CTA Head/Neck with occlusion of left ICA and posterior M2 left MCA branch at bifurcation. Patient underwent IR guidede revascularization of left ICA with stent and left MCA. Patient left intubated post-procedure. PCCM consulted.  ? ?Pertinent  Medical History  ? ?Past Medical History:  ?Diagnosis Date  ? Anxiety   ? Arthritis   ? Bursitis   ? Carpal tunnel syndrome   ? COPD (chronic obstructive pulmonary disease) ([place])   ? Depression   ? Fibromyalgia   ? Fibromyalgia   ? GERD (gastroesophageal reflux disease)   ? Hemorrhoids 10/27/2014  ? Hyperlipidemia   ? Hypertension   ? Kidney stones   ? ?Significant Hospital Events: ?Including procedures, antibiotic start and stop dates in addition to other pertinent events   ?06/12/21 > admitted for Code Stroke in left MCA territory with left ICA and left posterior MCA branch occlusion s/p revascularization w/IR. Intubated ?06/12/21 > Stable CT Head; extubated  ? ?Interim History / Subjective:  ?Overnight, remained on cleviprex gtt. This morning, patient is awake and interactive. Very anxious and in distress from her expressive aphasia   ? ?Objective   ?Blood pressure 127/60, pulse 68, temperature 98.8 ?F (37.1 ?C), temperature source Oral, resp. rate 20, height 5\' 5"  (1.651 m), weight 64.7 kg, SpO2 96 %. ?   ?Vent Mode: PSV;CPAP ?FiO2 (%):  [40 %-100 %] 40 % ?Set Rate:  [16 bmp-22 bmp] 22 bmp ?Vt Set:  [450  mL] 450 mL ?PEEP:  [5 cmH20] 5 cmH20 ?Pressure Support:  [8 cmH20] 8 cmH20 ?Plateau Pressure:  [13 cmH20-14 cmH20] 13 cmH20  ? ?Intake/Output Summary (Last 24 hours) at 06/13/2021 0717 ?Last data filed at 06/13/2021 0600 ?Gross per 24 hour  ?Intake 2243.16 ml  ?Output 3425 ml  ?Net -1181.84 ml  ? ?Filed Weights  ? 06/12/21 1000  ?Weight: 64.7 kg  ? ? ?Examination: ?General: generally well appearing female, lying comfortably in bed; anxious ?HENT: [place]/AT, PERRL, EOMI, MMM, poor dentition ?Lungs: CTAB, on room air ?Cardiovascular: RRR, S1 and S2 present, no mrg ?Abdomen: soft, nondistended, nontender, +BS ?Extremities: warm and dry, no peripheral edema ?Neuro: awake and alert x4, expressive aphasia present; decreased strength in RUE and RLE with sensation intact in all extremities  ?Skin: no rashes noted  ? ?Resolved Hospital Problem list   ?Acute respiratory insufficiency  ? 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 Stroke team following ?- Neurology following, appreciate recommendations ?- BP goals and glucose goals per stroke service ; cleviprex gtt for SBP 120-140; started on amlodipine to wean off cleviprex ?- Echo w/bubble study ?- MRI Brain wo contrast ?- Aspirin and Brilinta, Statin therapy ?- PT/OT/SLP ? ?PTSD/ Depression/Anxiety ?Resume home meds ? ?Hypokalemia ?Monitor and replete  ? ?Leukocytosis ?Afebrile.  Trend CBC  ? ?Normocytic anemia  ?  Recent procedure without evidence of bleeding at this time. Trend CBC ? ?Best Practice (right click and "Reselect all SmartList Selections" daily)  ? ?Diet/type: Regular consistency (see orders) ?DVT prophylaxis: LMWH ?GI prophylaxis: N/A ?Lines: N/A ?Foley:  N/A ?Code Status:  full code ?Last date of multidisciplinary goals of care discussion [discussed with stroke team] ? ?Labs   ?CBC: ?Recent Labs  ?Lab  06/12/21 ?0909 06/12/21 ?0916 06/12/21 ?1356 06/13/21 ?0217  ?WBC 7.5  --   --  18.9*  ?NEUTROABS 5.8  --   --  16.3*  ?HGB 13.2 13.3 12.2 10.9*  ?HCT 41.1 39.0 36.0 33.2*  ?MCV 93.2  --   --  90.5  ?PLT 181  --   --  185  ? ? ?Basic Metabolic Panel: ?Recent Labs  ?Lab 06/12/21 ?0909 06/12/21 ?0916 06/12/21 ?1356 06/13/21 ?0217  ?NA 141 141 143 141  ?K 3.7 3.7 3.1* 2.8*  ?CL 108 105  --  109  ?CO2 26  --   --  24  ?GLUCOSE 90 86  --  99  ?BUN 14 15  --  12  ?CREATININE 1.02* 0.90  --  0.76  ?CALCIUM 8.7*  --   --  8.5*  ? ?GFR: ?Estimated Creatinine Clearance: 64.8 mL/min (by C-G formula based on SCr of 0.76 mg/dL). ?Recent Labs  ?Lab 06/12/21 ?0909 06/13/21 ?0217  ?WBC 7.5 18.9*  ? ? ?Liver Function Tests: ?Recent Labs  ?Lab 06/12/21 ?[id number]  ?AST 19  ?ALT 19  ?ALKPHOS 68  ?BILITOT 0.7  ?PROT 6.1*  ?ALBUMIN 3.7  ? ?No results for input(s): LIPASE, AMYLASE in the last 168 hours. ?No results for input(s): AMMONIA in the last 168 hours. ? ?ABG ?   ?Component Value Date/Time  ? PHART 7.276 (L) 06/12/2021 1356  ? PCO2ART 51.0 (H) 06/12/2021 1356  ? PO2ART 238 (H) 06/12/2021 1356  ? HCO3 23.9 06/12/2021 1356  ? TCO2 25 06/12/2021 1356  ? ACIDBASEDEF 4.0 (H) 06/12/2021 1356  ? O2SAT 100 06/12/2021 1356  ?  ? ?Coagulation Profile: ?Recent Labs  ?Lab 06/12/21 ?[id number]  ?INR 1.0  ? ? ?Cardiac Enzymes: ?No results for input(s): CKTOTAL, CKMB, CKMBINDEX, TROPONINI in the last 168 hours. ? ?HbA1C: ?Hgb A1c MFr Bld  ?Date/Time Value Ref Range Status  ?06/13/2021 02:17 AM 5.1 4.8 - 5.6 % Final  ?  Comment:  ?  (NOTE) ?Pre diabetes:          5.7%-6.4% ? ?Diabetes:              >6.4% ? ?Glycemic control for   <7.0% ?adults with diabetes ?  ? ? ?CBG: ?Recent Labs  ?Lab 06/12/21 ?[id number]  ?GLUCAP 70  ? ? ?Critical care time:  ?  ? ? ? ? ? ?

## 2021-06-13 NOTE — Progress Notes (Addendum)
STROKE TEAM PROGRESS NOTE  ?INTERVAL HISTORY ?Yesterday patient successfully underwent emergent mechanical thrombectomy with Dr. Corliss Skains with TICI3 revascularization. Post IR CT, showed original ischemia stable. Hyperintensity also present, likely extravasation of contrast.  She was extubated yesterday and is breathing well.  Blood pressure adequately controlled.  Vital signs stable.  He continues ? ?Visit expressive aphasia but right hemiparesis is much improved ?MRI and ECHO with bubble ordered for today.  ?No family at bedside. Patient was awake, alert, engaged with evaluation.  ?Still has difficulty with expressive and conductive aphasia and patient was visible frustrated about it.  ? ?Vitals:  ? 06/13/21 0545 06/13/21 0600 06/13/21 0615 06/13/21 0630  ?BP: 118/69 112/67 114/68 127/60  ?Pulse: 63 (!) 59 64 68  ?Resp: 13 17 17 20   ?Temp:      ?TempSrc:      ?SpO2: 98% 98% 97% 96%  ?Weight:      ?Height:      ? ?CBC:  ?Recent Labs  ?Lab 06/12/21 ?0909 06/12/21 ?0916 06/12/21 ?1356 06/13/21 ?0217  ?WBC 7.5  --   --  18.9*  ?NEUTROABS 5.8  --   --  16.3*  ?HGB 13.2   < > 12.2 10.9*  ?HCT 41.1   < > 36.0 33.2*  ?MCV 93.2  --   --  90.5  ?PLT 181  --   --  185  ? < > = values in this interval not displayed.  ? ?Basic Metabolic Panel:  ?Recent Labs  ?Lab 06/12/21 ?0909 06/12/21 ?0916 06/12/21 ?1356 06/13/21 ?0217  ?NA 141 141 143 141  ?K 3.7 3.7 3.1* 2.8*  ?CL 108 105  --  109  ?CO2 26  --   --  24  ?GLUCOSE 90 86  --  99  ?BUN 14 15  --  12  ?CREATININE 1.02* 0.90  --  0.76  ?CALCIUM 8.7*  --   --  8.5*  ? ?Lipid Panel:  ?Recent Labs  ?Lab 06/13/21 ?0217  ?CHOL 111  ?TRIG 145  136  ?HDL 31*  ?CHOLHDL 3.6  ?VLDL 29  ?LDLCALC 51  ? ?HgbA1c:  ?Recent Labs  ?Lab 06/13/21 ?0217  ?HGBA1C 5.1  ? ?Urine Drug Screen:  ?No results for input(s): LABOPIA, COCAINSCRNUR, LABBENZ, AMPHETMU, THCU, LABBARB in the last 168 hours. ?Alcohol Level No results for input(s): ETH in the last 168 hours. ? ?IMAGING past 24 hours ?CT HEAD WO  CONTRAST (06/15/21) ? ?Result Date: 06/12/2021 ?CLINICAL DATA:  Stroke, follow-up, status post percutaneous intervention EXAM: CT HEAD WITHOUT CONTRAST TECHNIQUE: Contiguous axial images were obtained from the base of the skull through the vertex without intravenous contrast. RADIATION DOSE REDUCTION: This exam was performed according to the departmental dose-optimization program which includes automated exposure control, adjustment of the mA and/or kV according to patient size and/or use of iterative reconstruction technique. COMPARISON:  CT head 06/12/2021 9:15 a.m, CT head from thrombectomy 06/12/2021 11:30 a.m. FINDINGS: Brain: Hyperdensity in the left MCA territory (series 3, images 19-24), which may represent petechial hemorrhage versus post thrombectomy contrast staining, which appears similar to the CT done immediately after intervention. Along the course of the left MCA, particularly the M1 and M2 segments, there is hyperdensity, which may represent subarachnoid hemorrhage versus contrast extravasation from the left MCA, which also appears similar to the CT done immediately after intervention. No new area of hypodensity to suggest acute infarct. No mass, mass effect, or midline shift. No hydrocephalus. Vascular: No hyperdense vessel. Skull: Normal. Negative for fracture  or focal lesion. Sinuses/Orbits: No acute finding. Other: Trace fluid in left mastoid air cells. IMPRESSION: Status post thrombectomy, with hyperdensity in the left MCA territory, favored to represent post thrombectomy contrast staining although petechial hemorrhage could appear similar. Additional hyperdensity along the course of the left M1 and M2, which may represent contrast extravasation from the left MCA versus subarachnoid hemorrhage. These areas of hyperdensity appear similar to the CT done immediately after intervention. Electronically Signed   By: Wiliam Ke M.D.   On: 06/12/2021 15:55  ? ?DG CHEST PORT 1 VIEW ? ?Result Date:  06/12/2021 ?CLINICAL DATA:  COPD, ETT placement EXAM: PORTABLE CHEST 1 VIEW COMPARISON:  12/29/2020 FINDINGS: Endotracheal tube with the tip 2.6 cm above the carina. Nasogastric tube coursing below the diaphragm. Left basilar airspace disease likely reflecting atelectasis versus pneumonia. No focal consolidation. No pleural effusion or pneumothorax. Heart and mediastinal contours are unremarkable. No acute osseous abnormality. IMPRESSION: 1. Endotracheal tube with the tip 2.6 cm above the carina. Nasogastric tube coursing below the diaphragm. 2. Left basilar atelectasis versus pneumonia. Electronically Signed   By: Elige Ko M.D.   On: 06/12/2021 12:58  ? ?CT HEAD CODE STROKE WO CONTRAST ? ?Result Date: 06/12/2021 ?CLINICAL DATA:  Code stroke.  61 year old female EXAM: CT HEAD WITHOUT CONTRAST TECHNIQUE: Contiguous axial images were obtained from the base of the skull through the vertex without intravenous contrast. RADIATION DOSE REDUCTION: This exam was performed according to the departmental dose-optimization program which includes automated exposure control, adjustment of the mA and/or kV according to patient size and/or use of iterative reconstruction technique. COMPARISON:  Head CT 05/30/2019. FINDINGS: Brain: New but chronic appearing cortical encephalomalacia in the posterior left MCA territory since 2021. Associated subcortical white matter gliosis. However, superimposed asymmetric loss of the normal gray-white matter differentiation at the left frontal operculum which seems to spare the temporal operculum (coronal image 32 and series 2, image 16). Left insula is involved. Left lentiform is involved. Left caudate and internal capsule spared. No superimposed No midline shift, mass effect, or evidence of intracranial mass lesion. No ventriculomegaly. No acute intracranial hemorrhage identified. Right side and posterior fossa gray-white matter differentiation is stable and within normal limits. Vascular:  Calcified atherosclerosis at the skull base. Suspicious hyperdense left MCA branch in the sylvian fissure on series 2, image 12. Skull: Intact, negative. Sinuses/Orbits: Visualized paranasal sinuses and mastoids are stable and well aerated. Other: Mild retained secretions in the nasopharynx. Visualized orbits and scalp soft tissues are within normal limits. ASPECTS [facility] Stroke Program Early CT Score) - Ganglionic level infarction (caudate, lentiform nuclei, internal capsule, insula, M1-M3 cortex): 4 (abnormal M2, insula and lentiform) - Supraganglionic infarction (M4-M6 cortex): 3 (suspected chronic encephalomalacia). Total score (0-10 with 10 being normal): 7 IMPRESSION: 1. Evidence of acute on chronic Left MCA territory ischemia and hyperdense Left MCA branch in the sylvian fissure suspicious for ELVO. ASPECTS 7. No acute intracranial hemorrhage or mass effect. 2. These results were communicated to Dr. Selina Cooley at 9:23 am on 06/12/2021 by text page via the [facility] messaging system. Electronically Signed   By: Odessa Fleming M.D.   On: 06/12/2021 09:27  ? ?CT ANGIO HEAD NECK W WO CM W PERF (CODE STROKE) ? ?Result Date: 06/12/2021 ?CLINICAL DATA:  61 year old female code stroke presentation with plain CT evidence of acute on chronic left MCA ischemia. ASPECTS seven. EXAM: CT ANGIOGRAPHY HEAD AND NECK CT PERFUSION BRAIN TECHNIQUE: Multidetector CT imaging of the head and neck was performed using the  standard protocol during bolus administration of intravenous contrast. Multiplanar CT image reconstructions and MIPs were obtained to evaluate the vascular anatomy. Carotid stenosis measurements (when applicable) are obtained utilizing NASCET criteria, using the distal internal carotid diameter as the denominator. Multiphase CT imaging of the brain was performed following IV bolus contrast injection. Subsequent parametric perfusion maps were calculated using RAPID software. RADIATION DOSE REDUCTION: This exam was performed  according to the departmental dose-optimization program which includes automated exposure control, adjustment of the mA and/or kV according to patient size and/or use of iterative reconstruction technique. CONTRAST:  [id number]

## 2021-06-13 NOTE — Progress Notes (Signed)
Echocardiogram ?2D Echocardiogram has been performed. ? ?Oneal Deputy Maynor Mwangi RDCS ?06/13/2021, 12:41 PM ?
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
## 2021-06-13 NOTE — Plan of Care (Signed)
?  Problem: Education: ?Goal: Knowledge of disease or condition will improve ?Outcome: Progressing ?  ?Problem: Coping: ?Goal: Will verbalize positive feelings about self ?Outcome: Progressing ?  ?Problem: Health Behavior/Discharge Planning: ?Goal: Ability to manage health-related needs will improve ?Outcome: Progressing ?  ?Problem: Self-Care: ?Goal: Ability to participate in self-care as condition permits will improve ?Outcome: Progressing ?  ?Problem: Nutrition: ?Goal: Dietary intake will improve ?Outcome: Progressing ?  ?Problem: Ischemic Stroke/TIA Tissue Perfusion: ?Goal: Complications of ischemic stroke/TIA will be minimized ?Outcome: Progressing ?  ?

## 2021-06-14 DIAGNOSIS — I63412 Cerebral infarction due to embolism of left middle cerebral artery: Secondary | ICD-10-CM | POA: Diagnosis not present

## 2021-06-14 LAB — BASIC METABOLIC PANEL
Anion gap: 8 (ref 5–15)
BUN: 12 mg/dL (ref 8–23)
CO2: 25 mmol/L (ref 22–32)
Calcium: 8.9 mg/dL (ref 8.9–10.3)
Chloride: 107 mmol/L (ref 98–111)
Creatinine, Ser: 0.94 mg/dL (ref 0.44–1.00)
GFR, Estimated: >60 mL/min (ref >60)
Glucose, Bld: 90 mg/dL (ref 70–99)
Potassium: 3.4 mmol/L — ABNORMAL LOW (ref 3.5–5.1)
Sodium: 140 mmol/L (ref 135–145)

## 2021-06-14 LAB — CBC
HCT: 35.4 % — ABNORMAL LOW (ref 36.0–46.0)
Hemoglobin: 12 g/dL (ref 12.0–15.0)
MCH: 30 pg (ref 26.0–34.0)
MCHC: 33.9 g/dL (ref 30.0–36.0)
MCV: 88.5 fL (ref 80.0–100.0)
Platelets: 169 10*3/uL (ref 150–400)
RBC: 4 MIL/uL (ref 3.87–5.11)
RDW: 14.3 % (ref 11.5–15.5)
WBC: 7 10*3/uL (ref 4.0–10.5)
nRBC: 0 % (ref 0.0–0.2)

## 2021-06-14 MED ORDER — ATORVASTATIN CALCIUM 10 MG PO TABS
20.0000 mg | ORAL_TABLET | Freq: Every day | ORAL | Status: DC
  Administered 2021-06-14 – 2021-06-15 (×2): 20 mg via ORAL
  Filled 2021-06-14 (×2): qty 2

## 2021-06-14 MED ORDER — SALINE SPRAY 0.65 % NA SOLN
1.0000 | NASAL | Status: DC | PRN
  Filled 2021-06-14: qty 44

## 2021-06-14 MED ORDER — POTASSIUM CHLORIDE 20 MEQ PO PACK
40.0000 meq | PACK | Freq: Once | ORAL | Status: AC
  Administered 2021-06-14: 40 meq via ORAL
  Filled 2021-06-14: qty 2

## 2021-06-14 NOTE — Progress Notes (Signed)
Inpatient Rehabilitation Admissions Coordinator  ? ?I met with patient , spouse and son at bedside., We discussed goals and expectations of a possible [facility] admit. They are in agreement. I will begin Auth with [employer] for a possible [facility] admit pending their approval. ? ?Danne Baxter, RN, MSN ?Rehab Admissions Coordinator ?([phone] ?06/14/2021 12:34 PM ? ?

## 2021-06-14 NOTE — Progress Notes (Signed)
Speech Language Pathology Treatment: Cognitive-Linquistic  ?Patient Details ?Name: Susan Lewis ?MRN: [id number] ?DOB: 04/11/1960 ?Today's Date: 06/14/2021 ?Time: 6578-4696 ?SLP Time Calculation (min) (ACUTE ONLY): 23 min ? ?Assessment / Plan / Recommendation ?Clinical Impression ? Patient was seen at bedside for skilled SLP services targeting expressive aphasia goals in functional manner. SLP facilitated meaningful and functional task of ordering breakfast items off the menu with the pt. Pt offered choices between objects, and benefited from visual supports and semantic cues in order to articulate wants/needs. For example, pt described Frosted Flakes cereal as having a tiger on the box. When provided with picture of Frosted Flakes box and name of object spelled out, SLP used tactile cue of placing finger on picture/letters in order to verbalize "Frosted Flakes." Of note, pt used consonant cluster reduction and /s/ substitution when producing "flakes." SLP used phonetic cue /f/ to improve articulation. SLP then branched up to repeating at the phrase level to communicate entire breakfast order. Pt required visual support of having order written down and using a finger to trace the words in order to verbalize them with 100% accuracy. Pt was able to verbalize 3 menu items in a phrase with mod assist. Pt engaged in self-monitoring strategies throughout the duration of the session. Aware of communicative errors/breakdowns and needed verbal reinforcement that she is making steady progress with her speaking and language. Pt was able to demonstrate generalization of speech-language strategies as observed with phone call with husband and using the call bell to tell the nurse she needed to use the restroom. Pt will benefit from continued SLP services. In future sessions, fade visual supports for more abstract representations of words/objects.  ?  ?HPI HPI: 62 y.o. female presents to [facility] hospital on 06/12/2021 with R weakness and  impaired communication. 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 Pt underwent revascularization of L MCA and ICA with stent assisted angioplasty on 3/28. PMH includes COPD, cataracts, HTN, HLD, depression, and PTSD. ?  ?   ?SLP Plan ? Continue with current plan of care ? ?  ?  ?Recommendations for follow up therapy are one component of a multi-disciplinary discharge planning process, led by the attending physician.  Recommendations may be updated based on patient status, additional functional criteria and insurance authorization. ?  ? ?Recommendations  ?   ?   ?    ?   ? ? ? ? Oral Care Recommendations: Oral care BID ?Follow Up Recommendations: Acute inpatient rehab (3hours/day) ?Assistance recommended at discharge: Set up Supervision/Assistance ?SLP Visit Diagnosis: Aphasia (R47.01) ?Plan: Continue with current plan of care ? ? ? ? ?  ?  ? ? ?Ezekiel Slocumb ? ?06/14/2021, 11:05 AM ?

## 2021-06-14 NOTE — PMR Pre-admission (Signed)
PMR Admission Coordinator Pre-Admission Assessment ? ?[name] is an 61 y.o., female ?MRN: [id number] ?DOB: 1960/10/05 ?Height: 5\' 5"  (165.1 cm) ?Weight: 64.7 kg ? ?Insurance Information ?HMO: yes    PPO:      PCP:      IPA:      80/20:      OTHER:  ?PRIMARY: [employer]      Policy#: [id number]      Subscriber: pt ?CM Name: approved via fax      Phone#: [phone]     Fax#: [phone] ?Pre-Cert#: [id number] approved for 7 days after notification of initial admit day within 24 hrs of CIR admit      Employer:  ?Benefits:  Phone #: [phone]     Name: 3/30 ?Eff. Date: 03/18/2021     Deduct: $1000      Out of Pocket Max: $3000      Life Max: none ?CIR: 85%      SNF: 85% 60 days ?Outpatient: $40 co pay per visit     Co-Pay: 30 combined visits ?Home Health: 85%      Co-Pay: 120 combined visits ?DME: 85%     Co-Pay: 15% ?Providers: in network ? ?SECONDARY: none ? ?Financial Counselor:       Phone#:  ? ?The ?Data Collection Information Summary? for patients in Inpatient Rehabilitation Facilities with attached ?Privacy Act [place] Records? was provided and verbally reviewed with: N/A ? ?Emergency Contact Information ?Contact Information   ? ? Name Relation Home Work Mobile  ? [place] Spouse [phone]    ? Brendi, Viale Son   [phone]  ? ?  ? ?Current Medical History  ?Patient Admitting Diagnosis: CVA ? ?History of Present Illness: 61 year old female with history of COPD, cataracts and PTSD. Presented don 06/12/21 with a fall and difficulty speaking. called EMS who noticed aphasia and right hemiparesis.  On assessment the patient had severe expressive aphasia with a right-sided hemiplegia.  Non contrast CT scan of the head was obtained emergently which showed low-density in the like left insula compatible with acute stroke as well as old left parietal hypodensity compatible with a remote stroke.  The left middle cerebral artery also had hyperdense sign.  Aspect score was 7.  Patient  underwent immediate CT angiogram of the brain and CT perfusion.  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  The likely left M2 occlusion CT perfusion showed core infarct of 22 mL with mismatch volume of 38 mL.  Dr. Estanislado Pandy from interventional neuroradiology who agreed with taking the patient for emergent mechanical thrombectomy with TIC13 revascularization. 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 Continue statin at discharge with LDL 51. Advised to quit smoking. ? ?Complete NIHSS TOTAL: 7 ? ?Patient's medical record from [facility] has been reviewed by the rehabilitation admission coordinator and physician. ? ?Past Medical History  ?Past Medical History:  ?Diagnosis Date  ? Anxiety   ? Arthritis   ? Bursitis   ? Carpal tunnel syndrome   ? COPD (chronic obstructive pulmonary disease) ([place])   ? Depression   ? Fibromyalgia   ? Fibromyalgia   ? GERD (gastroesophageal reflux disease)   ? Hemorrhoids 10/27/2014  ? Hyperlipidemia   ? Hypertension   ? Kidney stones   ? ?Has the patient had major surgery during 100 days prior to admission? Yes ? ?Family History   ?  family history is not on file. ? ?Current Medications ? ?Current Facility-Administered Medications:  ?  acetaminophen (TYLENOL) tablet 650 mg, 650 mg, Per Tube, Q4H PRN **OR** acetaminophen (TYLENOL) 160 MG/5ML solution 650 mg, 650 mg, Per Tube, Q4H PRN **OR** acetaminophen (TYLENOL) suppository 650 mg, 650 mg, Rectal, Q4H PRN, Deveshwar, Sanjeev, MD ?  amLODipine (NORVASC) tablet 5 mg, 5 mg, Oral, Daily, Shafer, Devon, NP, 5 mg at 06/14/21 [id number] ?  aspirin chewable tablet 81 mg, 81 mg, Oral, Daily, 81 mg at 06/14/21 0905 **OR** aspirin chewable tablet 81 mg, 81 mg, Per Tube, Daily, Deveshwar, Sanjeev, MD ?  atorvastatin (LIPITOR) tablet 20 mg, 20 mg, Oral, Daily, Garvin Fila, MD, 20 mg at 06/14/21 1407 ?  busPIRone  (BUSPAR) tablet 15 mg, 15 mg, Oral, BID, Aslam, Sadia, MD, 15 mg at 06/14/21 0905 ?  enoxaparin (LOVENOX) injection 40 mg, 40 mg, Subcutaneous, Q24H, Leonie Man, Pramod S, MD, 40 mg at 06/14/21 [id number] ?  haloperidol lactate (HALDOL) injection 1 mg, 1 mg, Intravenous, Once PRN, Janine Ores, NP ?  hydrOXYzine (ATARAX) tablet 25 mg, 25 mg, Oral, TID PRN, Marva Panda, Sadia, MD, 25 mg at 06/13/21 1000 ?  iohexol (OMNIPAQUE) 300 MG/ML solution 100 mL, 100 mL, Intra-arterial, Once PRN, Deveshwar, Sanjeev, MD ?  MEDLINE mouth rinse, 15 mL, Mouth Rinse, BID, Garvin Fila, MD, 15 mL at 06/14/21 0906 ?  senna-docusate (Senokot-S) tablet 1 tablet, 1 tablet, Oral, QHS PRN, Garvin Fila, MD ?  sertraline (ZOLOFT) tablet 150 mg, 150 mg, Oral, Daily, Aslam, Sadia, MD, 150 mg at 06/14/21 0906 ?  sodium chloride flush (NS) 0.9 % injection 3 mL, 3 mL, Intravenous, Once, Carmin Muskrat, MD ?  ticagrelor [facility]) tablet 90 mg, 90 mg, Oral, BID, 90 mg at 06/14/21 0905 **OR** ticagrelor (BRILINTA) tablet 90 mg, 90 mg, Per Tube, BID, Deveshwar, Sanjeev, MD, 90 mg at 06/12/21 1653 ?  traMADol (ULTRAM) tablet 50 mg, 50 mg, Oral, BID, Aslam, Sadia, MD, 50 mg at 06/14/21 0905 ?  traZODone (DESYREL) tablet 150 mg, 150 mg, Oral, QHS, Aslam, Sadia, MD, 150 mg at 06/13/21 2113 ? ?Patients Current Diet:  ?Diet Order   ? ?       ?  Diet Heart Room service appropriate? Yes; Fluid consistency: Thin  Diet effective now       ?  ? ?  ?  ? ?  ? ?Precautions / Restrictions ?Precautions ?Precautions: Fall ?Restrictions ?Weight Bearing Restrictions: No  ? ?Has the patient had 2 or more falls or a fall with injury in the past year? No ? ?Prior Activity Level ?Community (5-7x/wk): independent, works part time at [employer], drives ? ?Prior Functional Level ?Self Care: Did the patient need help bathing, dressing, using the toilet or eating? Independent ? ?Indoor Mobility: Did the patient need assistance with walking from room to room (with or without device)?  Independent ? ?Stairs: Did the patient need assistance with internal or external stairs (with or without device)? Independent ? ?Functional Cognition: Did the patient need help planning regular tasks such as shopping or remembering to take medications? Independent ? ?Patient Information ?Are you of Hispanic, Latino/a,or Spanish origin?: A. No, not of Hispanic, Latino/a, or Spanish origin ?What is your race?: A. White ?Do you need or want an interpreter to communicate with a doctor or health care staff?: 0. No ? ?Patient's Response To:  ?Health Literacy and Transportation ?Is the patient able to respond to health literacy and transportation needs?: Yes ?Health Literacy -  How often do you need to have someone help you when you read instructions, pamphlets, or other written material from your doctor or pharmacy?: Never ?In the past 12 months, has lack of transportation kept you from medical appointments or from getting medications?: No ?In the past 12 months, has lack of transportation kept you from meetings, work, or from getting things needed for daily living?: No ? ?Home Assistive Devices / Equipment ?Home Equipment: None ? ?Prior Device Use: Indicate devices/aids used by the patient prior to current illness, exacerbation or injury? None of the above ? ?Current Functional Level ?Cognition ? Arousal/Alertness: Awake/alert ?Overall Cognitive Status: Impaired/Different from baseline ?Difficult to assess due to: Impaired communication ?Orientation Level: Oriented to person, Oriented to place, Oriented to time, Disoriented to situation ?Safety/Judgement: Decreased awareness of safety ?General Comments: pt with significant expressive aphasia, does better with yes/no questions due to word finding difficulties. Pt is oriented to place, situation, person. Pt follows commands well, demonstrates reduced awareness of deficits. ?Attention: Focused ?Focused Attention: Appears intact ?Awareness: Impaired ?Awareness Impairment:  Intellectual impairment ?Problem Solving: Impaired ?Problem Solving Impairment: Verbal basic, Functional basic ?Safety/Judgment: Appears intact ?   ?Extremity Assessment ?(includes Sensation/Coordination)

## 2021-06-14 NOTE — Progress Notes (Signed)
Patient with orders for transfer out of ICU. Report given to [facility] on 3W. Family aware of transfer. ?

## 2021-06-14 NOTE — Progress Notes (Addendum)
STROKE TEAM PROGRESS NOTE  ?INTERVAL HISTORY ?No acute events overnight. VSS, afebrile, BP controlled. MRI and ECHO completed and resulted 3/29, see below.  ?Son and husband at bedside. Patient was awake, alert, engaged.  ?Much improvement in expressive aphasia and right hemiparesis. ?No acute concerns.  ? ? ?Vitals:  ? 06/14/21 1100 06/14/21 1200 06/14/21 1300 06/14/21 1514  ?BP: 131/70 130/89 (!) 145/96 (!) 124/94  ?Pulse: 60 (!) 59 (!) 59 66  ?Resp: 11 14 11 17   ?Temp:  99.3 ?F (37.4 ?C)  98.3 ?F (36.8 ?C)  ?TempSrc:  Oral  Oral  ?SpO2: 93% 96% 97% 99%  ?Weight:      ?Height:      ? ?CBC:  ?Recent Labs  ?Lab 06/12/21 ?0909 06/12/21 ?0916 06/13/21 ?0217 06/14/21 ?0833  ?WBC 7.5  --  18.9* 7.0  ?NEUTROABS 5.8  --  16.3*  --   ?HGB 13.2   < > 10.9* 12.0  ?HCT 41.1   < > 33.2* 35.4*  ?MCV 93.2  --  90.5 88.5  ?PLT 181  --  185 169  ? < > = values in this interval not displayed.  ? ? ?Basic Metabolic Panel:  ?Recent Labs  ?Lab 06/13/21 ?0217 06/14/21 ?0833  ?NA 141 140  ?K 2.8* 3.4*  ?CL 109 107  ?CO2 24 25  ?GLUCOSE 99 90  ?BUN 12 12  ?CREATININE 0.76 0.94  ?CALCIUM 8.5* 8.9  ? ? ?Lipid Panel:  ?Recent Labs  ?Lab 06/13/21 ?0217  ?CHOL 111  ?TRIG 145  136  ?HDL 31*  ?CHOLHDL 3.6  ?VLDL 29  ?[place] 51  ? ? ?HgbA1c:  ?Recent Labs  ?Lab 06/13/21 ?0217  ?HGBA1C 5.1  ? ? ?Urine Drug Screen:  ?No results for input(s): LABOPIA, COCAINSCRNUR, LABBENZ, AMPHETMU, THCU, LABBARB in the last 168 hours. ?Alcohol Level No results for input(s): ETH in the last 168 hours. ? ?IMAGING past 24 hours ?No results found. ? ?PHYSICAL EXAM ?Constitutional: Appears well-developed and well-nourished. In no apparent distress. ?Psych: Affect appropriate to situation ?Eyes: No scleral injection ?Respiratory: Effort normal, non-labored breathing ?Gait and Station: Deferred ? ?Neuro: ?Mental Status: ?Patient is awake, alert, oriented to person, place, month, year, and situation. ?Language: Expressive aphasia. Able to follow simple commands,    ?Able to repeat and name, though with effort.  Can speak occasional short sentences with some difficulty ? ?Cranial Nerves: ?II: Visual Fields are full. Pupils are equal, round, and reactive to light.   ?III,IV, VI: EOMI without ptosis or diploplia.  ?V: Facial sensation is symmetric to temperature ?VII: Facial movement is symmetric resting and smiling ?VIII: Hearing is intact to voice ?X: Palate elevates symmetrically ?XI: Shoulder shrug and chin turning is symmetric ?XII: Tongue protrudes midline without atrophy or fasciculations ?Motor ?Tone is normal. Bulk is normal. 4/5 weakness in upper and lower extremities ?Sensory: ?Sensation is symmetric to light touch and temperature in the arms and legs. No extinction to DSS present.  ?Cerebellar: ?Right upper and lower extremity ataxia ? ?ASSESSMENT/PLAN ?[name] is a 61 y.o. female with history of HLD, HTN, COPD, cataracts, depression and PTSD presenting with sudden onset of expressive aphasia and right hemiplegia with NIH stroke scale of 13 outside time window for IV thrombolysis but within 24 hours. CT scan shows left frontal insular infarct with ASPECT 7 and CT perfusion showed significant penumbra and salvageable brain tissue with CTA showing left carotid and left MR branch occlusion. Would benefit from mechanical thrombectomy. S/p IR with TICI  3 revascularization.  ? 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 ?Code Stroke CT head: Left frontal insular infarct with aspect score of 7 ?MRI: Scattered diffusion restriction in L MCA. No HT.  ?CTA head & neck: Left carotid and left M2 branch occlusion ?CT perfusion: significant penumbra and salvageable brain tissue  ?2D Echo: Hyperdynamic EF 70%, LV had no regional wall motion abnormalities. Left atrial size was mildly dilated. no evidence  of any interatrial shunt ?LDL 51  (06/13/2021) ?HgbA1c 5.1 (06/13/2021)  ?Recommend cardiac monitoring  ?VTE prophylaxis - Enoxaparin (lovenox) injection 40 mg start: 06/13/21, Sequential compression devices, below knee ?Diet: Cardiac ?No antithrombotic prior to admission, now on aspirin 81 mg daily and Brilinta (ticagrelor) 90 mg bid for 6 months, then aspirin 81 mg daily monotherapy ?Therapy recommendations:Acute inpatient rehab (3hours/day), Rolling walker (2 wheels), BSC/3in1 (06/13/21)  ?Disposition: Acute inpatient rehab (3hours/day)  ? ?Hypertension ?Home meds: N/A   ?Stable ?Long-term BP goal 120-150 given ICA stenosis.  ? ?Hyperlipidemia ?Home meds:  Atorvastatin 20 mg daily, resumed in hospital ?LDL 51  (06/13/2021), goal < 70  ?Continue statin at discharge ?  ?Other Stroke Risk Factors ?Advanced Age >/= 51  ?Obesity, body mass index is 23.74 kg/m?., BMI >/= 30 associated with increased stroke risk, recommended weight loss, diet and exercise as appropriate  ?Coronary artery disease  ?Cigarette smoker, advised to stop smoking  ?ETOH use, alcohol level No results found for requested labs within last 26280 hours., advised to drink no more than 1-2 drink(s) a day ?Substance abuse - Reported cannabis use ? ? ?Hospital day # 2 ? ?Signed: ?Merrily Brittle, DO ?Resident, PGY-1 ?[facility] ?06/14/2021, 4:12 PM  ?I have personally obtained history,examined this patient, reviewed notes, independently viewed imaging studies, participated in medical decision making and plan of care.ROS completed by me personally and pertinent positives fully documented  I have made any additions or clarifications directly to the above note. Agree with note above.  Patient is showing steady improvement.  Continue mobilization out of bed and ongoing therapy consults.  Transfer to neurology floor bed.  Hopefully transfer to inpatient rehab in the next few days.  Discussed with patient, husband and son and answered questions.   Greater than 50% time during this 35-minute visit was spent in counseling and coordination of care and discussion patient and care team and answering questions. ? ?Antony Contras, MD ?Medical Director ?[facility] ?Pager: [phone] ?06/14/2021 4:34 PM ? ?To contact Stroke Continuity provider, please refer to [email or web address] ?After hours, contact General Neurology  ? ?

## 2021-06-14 NOTE — Progress Notes (Signed)
Physical Therapy Treatment ?Patient Details ?Name: Susan Lewis ?MRN: [id number] ?DOB: 04-22-1960 ?Today's Date: 06/14/2021 ? ? ?History of Present Illness 61 y.o. female presents to [facility] hospital on 06/12/2021 with R weakness and impaired communication. 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 Pt underwent revascularization of L MCA and ICA with stent assisted angioplasty on 3/28. PMH includes COPD, cataracts, HTN, HLD, depression, and PTSD. ? ?  ?PT Comments  ? ? Pt limited by reports of nausea and dizziness when washing hands in bathroom. BP noted to be lower at 119/82 than earlier in day (130-140s systolic). Pt continues to demonstrate significant impairments in balance, with multiple losses of balance in standing and when ambulating. Pt will benefit from continued aggressive mobilization and acute PT services in an effort to reduce falls risk and to restore independence. PT continues to recommend AIR admission.  ?Recommendations for follow up therapy are one component of a multi-disciplinary discharge planning process, led by the attending physician.  Recommendations may be updated based on patient status, additional functional criteria and insurance authorization. ? ?Follow Up Recommendations ? Acute inpatient rehab (3hours/day) ?  ?  ?Assistance Recommended at Discharge Intermittent Supervision/Assistance  ?Patient can return home with the following A lot of help with walking and/or transfers;A lot of help with bathing/dressing/bathroom;Assistance with cooking/housework;Assistance with feeding;Direct supervision/assist for financial management;Direct supervision/assist for medications management;Help with stairs or ramp for entrance;Assist for transportation ?  ?Equipment Recommendations ? Rolling walker (2 wheels);BSC/3in1  ?  ?Recommendations for Other Services   ? ? ?  ?Precautions / Restrictions Precautions ?Precautions: Fall ?Restrictions ?Weight Bearing Restrictions: No  ?  ? ?Mobility ? Bed  Mobility ?Overal bed mobility: Needs Assistance ?Bed Mobility: Supine to Sit, Sit to Supine ?  ?  ?Supine to sit: Supervision ?Sit to supine: Min guard ?  ?  ?  ? ?Transfers ?Overall transfer level: Needs assistance ?Equipment used: None ?Transfers: Sit to/from Stand ?Sit to Stand: Min guard ?  ?  ?  ?  ?  ?  ?  ? ?Ambulation/Gait ?Ambulation/Gait assistance: Mod assist ?Gait Distance (Feet): 10 Feet (10' x 2) ?Assistive device: None ?Gait Pattern/deviations: Step-through pattern ?Gait velocity: reduced ?Gait velocity interpretation: <1.8 ft/sec, indicate of risk for recurrent falls ?  ?General Gait Details: pt with slowed step-through gait, increased postural sway with multiple posterior and lateral losses of balance. Pt with orthostatic symptoms when ambulating back to bed, requiring increased assistance ? ? ?Stairs ?  ?  ?  ?  ?  ? ? ?Wheelchair Mobility ?  ? ?Modified Rankin (Stroke Patients Only) ?Modified Rankin (Stroke Patients Only) ?Pre-Morbid Rankin Score: No symptoms ?Modified Rankin: Moderately severe disability ? ? ?  ?Balance Overall balance assessment: Needs assistance ?Sitting-balance support: No upper extremity supported, Feet supported ?Sitting balance-Leahy Scale: Good ?  ?  ?Standing balance support: No upper extremity supported, During functional activity ?Standing balance-Leahy Scale: Poor ?Standing balance comment: min-modA, posterior loss of balance at sink ?  ?  ?  ?  ?  ?  ?  ?  ?  ?  ?  ?  ? ?  ?Cognition Arousal/Alertness: Awake/alert ?Behavior During Therapy: Impulsive ?Overall Cognitive Status: Impaired/Different from baseline ?Area of Impairment: Awareness, Problem solving, Safety/judgement ?  ?  ?  ?  ?  ?  ?  ?  ?  ?  ?  ?  ?Safety/Judgement: Decreased awareness of safety ?Awareness: Emergent ?Problem Solving: Difficulty sequencing ?  ?  ?  ? ?  ?Exercises   ? ?  ?  General Comments General comments (skin integrity, edema, etc.): pt on RA, becomes pale, reporting dizziness and nausea  when hand washing at the sink. PT assists back to bed, BP of 119/82 when pt in supine. Pt reports improvement in symptoms prior to PT departure, RN made aware ?  ?  ? ?Pertinent Vitals/Pain Pain Assessment ?Pain Assessment: No/denies pain  ? ? ?Home Living   ?  ?  ?  ?  ?  ?  ?  ?  ?  ?   ?  ?Prior Function    ?  ?  ?   ? ?PT Goals (current goals can now be found in the care plan section) Acute Rehab PT Goals ?Patient Stated Goal: to return to independence ?Progress towards PT goals: Not progressing toward goals - comment (limited by nausea, dizziness) ? ?  ?Frequency ? ? ? Min 4X/week ? ? ? ?  ?PT Plan Current plan remains appropriate  ? ? ?Co-evaluation   ?  ?  ?  ?  ? ?  ?AM-PAC PT "6 Clicks" Mobility   ?Outcome Measure ? Help needed turning from your back to your side while in a flat bed without using bedrails?: A Little ?Help needed moving from lying on your back to sitting on the side of a flat bed without using bedrails?: A Little ?Help needed moving to and from a bed to a chair (including a wheelchair)?: A Little ?Help needed standing up from a chair using your arms (e.g., wheelchair or bedside chair)?: A Little ?Help needed to walk in hospital room?: A Lot ?Help needed climbing 3-5 steps with a railing? : Total ?6 Click Score: 15 ? ?  ?End of Session   ?Activity Tolerance: Treatment limited secondary to medical complications (Comment) (nausea, dizziness, pallor) ?Patient left: in bed;with call bell/phone within reach;with bed alarm set ?Nurse Communication: Mobility status ?PT Visit Diagnosis: Other abnormalities of gait and mobility (R26.89);Muscle weakness (generalized) (M62.81);Other symptoms and signs involving the nervous system (R29.898) ?  ? ? ?Time: 3734-2876 ?PT Time Calculation (min) (ACUTE ONLY): 15 min ? ?Charges:  $Therapeutic Activity: 8-22 mins          ?          ? ?Arlyss Gandy, PT, DPT ?Acute Rehabilitation ?Pager: [phone] ?Office [phone] ? ? ? ?Arlyss Gandy ?06/14/2021, 1:51 PM ? ?

## 2021-06-14 NOTE — TOC CAGE-AID Note (Signed)
Transition of Care (TOC) - CAGE-AID Screening ? ? ?Patient Details  ?Name: Susan Lewis ?MRN: [id number] ?Date of Birth: 1960-09-14 ? ?Transition of Care (TOC) CM/SW Contact:    ?Shundra Wirsing C Tarpley-Carter, LCSWA ?Phone Number: ?06/14/2021, 3:00 PM ? ? ?Clinical Narrative: ?Pt participated in Cage-Aid.  Pt stated she does not use substance or ETOH.  Pt was not offered resources, due to no usage of substance or ETOH.    ? ?Insurance underwriter, MSW, LCSW-A ?Pronouns:  She/Her/Hers ?[facility] of [facility] ?Clinical Social Worker ?Direct Number:  [phone] ?[email or web address]  ? ? ?CAGE-AID Screening: ?Substance Abuse Screening unable to be completed due to: : Patient unable to participate ? ?Have You Ever Felt You Ought to Cut Down on Your Drinking or Drug Use?: No ?Have People Annoyed You By Critizing Your Drinking Or Drug Use?: No ?Have You Felt Bad Or Guilty About Your Drinking Or Drug Use?: No ?Have You Ever Had a Drink or Used Drugs First Thing In The Morning to Steady Your Nerves or to Get Rid of a Hangover?: No ?CAGE-AID Score: 0 ? ?Substance Abuse Education Offered: No ? ?Substance abuse interventions: Educational Materials ? ? ? ? ? ? ?

## 2021-06-15 ENCOUNTER — Inpatient Hospital Stay (HOSPITAL_COMMUNITY)
Admission: RE | Admit: 2021-06-15 | Discharge: 2021-06-22 | DRG: 057 | Disposition: A | Discharge status: Home / Self Care | Payer: 59 | Source: Intra-hospital | Attending: Physical Medicine & Rehabilitation | Admitting: Physical Medicine & Rehabilitation

## 2021-06-15 ENCOUNTER — Encounter (HOSPITAL_COMMUNITY): Payer: Self-pay | Admitting: Physical Medicine & Rehabilitation

## 2021-06-15 ENCOUNTER — Other Ambulatory Visit: Payer: Self-pay | Admitting: Student

## 2021-06-15 ENCOUNTER — Other Ambulatory Visit: Payer: Self-pay

## 2021-06-15 ENCOUNTER — Other Ambulatory Visit (HOSPITAL_COMMUNITY): Payer: Self-pay

## 2021-06-15 DIAGNOSIS — S7011XD Contusion of right thigh, subsequent encounter: Secondary | ICD-10-CM | POA: Diagnosis not present

## 2021-06-15 DIAGNOSIS — M797 Fibromyalgia: Secondary | ICD-10-CM | POA: Diagnosis present

## 2021-06-15 DIAGNOSIS — K219 Gastro-esophageal reflux disease without esophagitis: Secondary | ICD-10-CM | POA: Diagnosis present

## 2021-06-15 DIAGNOSIS — F411 Generalized anxiety disorder: Secondary | ICD-10-CM

## 2021-06-15 DIAGNOSIS — N3281 Overactive bladder: Secondary | ICD-10-CM

## 2021-06-15 DIAGNOSIS — I6523 Occlusion and stenosis of bilateral carotid arteries: Secondary | ICD-10-CM | POA: Diagnosis present

## 2021-06-15 DIAGNOSIS — I69351 Hemiplegia and hemiparesis following cerebral infarction affecting right dominant side: Secondary | ICD-10-CM | POA: Diagnosis present

## 2021-06-15 DIAGNOSIS — I63412 Cerebral infarction due to embolism of left middle cerebral artery: Secondary | ICD-10-CM | POA: Diagnosis not present

## 2021-06-15 DIAGNOSIS — J449 Chronic obstructive pulmonary disease, unspecified: Secondary | ICD-10-CM | POA: Diagnosis present

## 2021-06-15 DIAGNOSIS — E785 Hyperlipidemia, unspecified: Secondary | ICD-10-CM | POA: Diagnosis present

## 2021-06-15 DIAGNOSIS — D638 Anemia in other chronic diseases classified elsewhere: Secondary | ICD-10-CM | POA: Diagnosis present

## 2021-06-15 DIAGNOSIS — F17210 Nicotine dependence, cigarettes, uncomplicated: Secondary | ICD-10-CM | POA: Diagnosis present

## 2021-06-15 DIAGNOSIS — Z716 Tobacco abuse counseling: Secondary | ICD-10-CM | POA: Diagnosis not present

## 2021-06-15 DIAGNOSIS — Z90722 Acquired absence of ovaries, bilateral: Secondary | ICD-10-CM

## 2021-06-15 DIAGNOSIS — I69320 Aphasia following cerebral infarction: Secondary | ICD-10-CM | POA: Diagnosis not present

## 2021-06-15 DIAGNOSIS — M1990 Unspecified osteoarthritis, unspecified site: Secondary | ICD-10-CM | POA: Diagnosis present

## 2021-06-15 DIAGNOSIS — Z9049 Acquired absence of other specified parts of digestive tract: Secondary | ICD-10-CM | POA: Diagnosis not present

## 2021-06-15 DIAGNOSIS — F4310 Post-traumatic stress disorder, unspecified: Secondary | ICD-10-CM | POA: Diagnosis present

## 2021-06-15 DIAGNOSIS — F32A Depression, unspecified: Secondary | ICD-10-CM | POA: Diagnosis present

## 2021-06-15 DIAGNOSIS — S7010XD Contusion of unspecified thigh, subsequent encounter: Secondary | ICD-10-CM | POA: Diagnosis not present

## 2021-06-15 DIAGNOSIS — Z79891 Long term (current) use of opiate analgesic: Secondary | ICD-10-CM

## 2021-06-15 DIAGNOSIS — Z79899 Other long term (current) drug therapy: Secondary | ICD-10-CM | POA: Diagnosis not present

## 2021-06-15 DIAGNOSIS — F418 Other specified anxiety disorders: Secondary | ICD-10-CM

## 2021-06-15 DIAGNOSIS — Z7982 Long term (current) use of aspirin: Secondary | ICD-10-CM | POA: Diagnosis not present

## 2021-06-15 DIAGNOSIS — W06XXXD Fall from bed, subsequent encounter: Secondary | ICD-10-CM | POA: Diagnosis present

## 2021-06-15 DIAGNOSIS — M719 Bursopathy, unspecified: Secondary | ICD-10-CM

## 2021-06-15 DIAGNOSIS — I63512 Cerebral infarction due to unspecified occlusion or stenosis of left middle cerebral artery: Secondary | ICD-10-CM | POA: Diagnosis present

## 2021-06-15 DIAGNOSIS — I10 Essential (primary) hypertension: Secondary | ICD-10-CM | POA: Diagnosis present

## 2021-06-15 DIAGNOSIS — I69390 Apraxia following cerebral infarction: Secondary | ICD-10-CM | POA: Diagnosis not present

## 2021-06-15 DIAGNOSIS — D62 Acute posthemorrhagic anemia: Secondary | ICD-10-CM | POA: Diagnosis not present

## 2021-06-15 DIAGNOSIS — Z885 Allergy status to narcotic agent status: Secondary | ICD-10-CM

## 2021-06-15 DIAGNOSIS — F419 Anxiety disorder, unspecified: Secondary | ICD-10-CM | POA: Diagnosis present

## 2021-06-15 MED ORDER — TICAGRELOR 90 MG PO TABS
90.0000 mg | ORAL_TABLET | Freq: Two times a day (BID) | ORAL | Status: DC
  Administered 2021-06-15 – 2021-06-22 (×14): 90 mg via ORAL
  Filled 2021-06-15 (×14): qty 1

## 2021-06-15 MED ORDER — SERTRALINE HCL 100 MG PO TABS
150.0000 mg | ORAL_TABLET | Freq: Every day | ORAL | 0 refills | Status: DC
Start: 2021-06-16 — End: 2021-06-21
  Filled 2021-06-15: qty 45, 30d supply, fill #0

## 2021-06-15 MED ORDER — ATORVASTATIN CALCIUM 10 MG PO TABS
20.0000 mg | ORAL_TABLET | Freq: Every day | ORAL | Status: DC
  Administered 2021-06-16 – 2021-06-21 (×7): 20 mg via ORAL
  Filled 2021-06-15 (×6): qty 2

## 2021-06-15 MED ORDER — ACETAMINOPHEN 160 MG/5ML PO SOLN
650.0000 mg | ORAL | Status: DC | PRN
Start: 2021-06-15 — End: 2021-06-19

## 2021-06-15 MED ORDER — TRAZODONE HCL 150 MG PO TABS
150.0000 mg | ORAL_TABLET | Freq: Every day | ORAL | Status: DC
Start: 2021-06-15 — End: 2021-06-22
  Administered 2021-06-15 – 2021-06-21 (×7): 150 mg via ORAL
  Filled 2021-06-15 (×5): qty 3
  Filled 2021-06-15 (×2): qty 1
  Filled 2021-06-15: qty 3

## 2021-06-15 MED ORDER — TICAGRELOR 90 MG PO TABS
90.0000 mg | ORAL_TABLET | Freq: Two times a day (BID) | ORAL | 0 refills | Status: DC
  Filled 2021-06-15: qty 60, 30d supply, fill #0

## 2021-06-15 MED ORDER — ATORVASTATIN CALCIUM 20 MG PO TABS
20.0000 mg | ORAL_TABLET | Freq: Every day | ORAL | 0 refills | Status: DC
Start: 2021-06-15 — End: 2021-06-21
  Filled 2021-06-15: qty 30, 30d supply, fill #0

## 2021-06-15 MED ORDER — BUSPIRONE HCL 5 MG PO TABS
15.0000 mg | ORAL_TABLET | Freq: Two times a day (BID) | ORAL | Status: DC
  Administered 2021-06-15 – 2021-06-22 (×14): 15 mg via ORAL
  Filled 2021-06-15 (×14): qty 3

## 2021-06-15 MED ORDER — ENOXAPARIN SODIUM 40 MG/0.4ML IJ SOSY
40.0000 mg | PREFILLED_SYRINGE | INTRAMUSCULAR | Status: DC
Start: 2021-06-16 — End: 2021-06-22
  Administered 2021-06-16 – 2021-06-21 (×6): 40 mg via SUBCUTANEOUS
  Filled 2021-06-15 (×7): qty 0.4

## 2021-06-15 MED ORDER — ASPIRIN 81 MG PO CHEW
81.0000 mg | CHEWABLE_TABLET | Freq: Every day | ORAL | 0 refills | Status: AC
  Filled 2021-06-15: qty 30, 30d supply, fill #0

## 2021-06-15 MED ORDER — ACETAMINOPHEN 325 MG PO TABS: 650.0000 mg | ORAL_TABLET | ORAL | Status: DC | PRN

## 2021-06-15 MED ORDER — PANTOPRAZOLE SODIUM 40 MG PO TBEC
40.0000 mg | DELAYED_RELEASE_TABLET | Freq: Every day | ORAL | Status: DC
  Administered 2021-06-15 – 2021-06-22 (×8): 40 mg via ORAL
  Filled 2021-06-15 (×8): qty 1

## 2021-06-15 MED ORDER — SERTRALINE HCL 50 MG PO TABS
150.0000 mg | ORAL_TABLET | Freq: Every day | ORAL | Status: DC
  Administered 2021-06-16 – 2021-06-22 (×7): 150 mg via ORAL
  Filled 2021-06-15 (×7): qty 1

## 2021-06-15 MED ORDER — ASPIRIN 81 MG PO CHEW
81.0000 mg | CHEWABLE_TABLET | Freq: Every day | ORAL | Status: DC
  Administered 2021-06-15 – 2021-06-22 (×8): 81 mg via ORAL
  Filled 2021-06-15 (×8): qty 1

## 2021-06-15 MED ORDER — SALINE SPRAY 0.65 % NA SOLN
1.0000 | NASAL | Status: DC | PRN
  Filled 2021-06-15: qty 44

## 2021-06-15 MED ORDER — TICAGRELOR 90 MG PO TABS
90.0000 mg | ORAL_TABLET | Freq: Two times a day (BID) | ORAL | Status: DC
  Filled 2021-06-15 (×6): qty 1

## 2021-06-15 MED ORDER — AMLODIPINE BESYLATE 5 MG PO TABS
5.0000 mg | ORAL_TABLET | Freq: Every day | ORAL | Status: DC
  Administered 2021-06-15 – 2021-06-22 (×8): 5 mg via ORAL
  Filled 2021-06-15 (×8): qty 1

## 2021-06-15 MED ORDER — ASPIRIN 81 MG PO CHEW
81.0000 mg | CHEWABLE_TABLET | Freq: Every day | ORAL | Status: DC
  Filled 2021-06-15 (×3): qty 1

## 2021-06-15 MED ORDER — ENOXAPARIN SODIUM 40 MG/0.4ML IJ SOSY: 40.0000 mg | PREFILLED_SYRINGE | INTRAMUSCULAR | Status: DC

## 2021-06-15 MED ORDER — ACETAMINOPHEN 650 MG RE SUPP: 650.0000 mg | RECTAL | Status: DC | PRN

## 2021-06-15 MED ORDER — AMLODIPINE BESYLATE 5 MG PO TABS
5.0000 mg | ORAL_TABLET | Freq: Every day | ORAL | 0 refills | Status: DC
Start: 2021-06-16 — End: 2021-06-21
  Filled 2021-06-15: qty 30, 30d supply, fill #0

## 2021-06-15 MED ORDER — POTASSIUM CHLORIDE CRYS ER 20 MEQ PO TBCR
30.0000 meq | EXTENDED_RELEASE_TABLET | Freq: Two times a day (BID) | ORAL | Status: DC
  Administered 2021-06-15 – 2021-06-18 (×6): 30 meq via ORAL
  Filled 2021-06-15 (×6): qty 1

## 2021-06-15 MED ORDER — TRAMADOL HCL 50 MG PO TABS
50.0000 mg | ORAL_TABLET | Freq: Two times a day (BID) | ORAL | Status: DC
  Administered 2021-06-15 – 2021-06-22 (×14): 50 mg via ORAL
  Filled 2021-06-15 (×14): qty 1

## 2021-06-15 MED ORDER — HYDROXYZINE HCL 25 MG PO TABS
25.0000 mg | ORAL_TABLET | Freq: Three times a day (TID) | ORAL | Status: DC | PRN
  Administered 2021-06-17: 25 mg via ORAL
  Filled 2021-06-15: qty 1

## 2021-06-15 MED ORDER — POTASSIUM CHLORIDE CRYS ER 20 MEQ PO TBCR
30.0000 meq | EXTENDED_RELEASE_TABLET | Freq: Two times a day (BID) | ORAL | Status: DC
  Administered 2021-06-15: 30 meq via ORAL
  Filled 2021-06-15: qty 1

## 2021-06-15 MED ORDER — SENNOSIDES-DOCUSATE SODIUM 8.6-50 MG PO TABS: 1.0000 | ORAL_TABLET | Freq: Every evening | ORAL | Status: DC | PRN

## 2021-06-15 NOTE — Progress Notes (Signed)
Occupational Therapy Treatment ?Patient Details ?Name: Susan Lewis ?MRN: [id number] ?DOB: Jul 19, 1960 ?Today's Date: 06/15/2021 ? ? ?History of present illness 61 y.o. female presents to [facility] hospital on 06/12/2021 with R weakness and impaired communication. 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 Pt underwent revascularization of L MCA and ICA with stent assisted angioplasty on 3/28. PMH includes COPD, cataracts, HTN, HLD, depression, and PTSD. ?  ?OT comments ? Patient with good progress toward all patient focused goals.  Patient's speech production is improving, her R arm remains weak with decreased fine and gross motor, and although she remains unsteady on her feet, her dynamic balance is better.  Currently she is needing setup for light grooming and self feeding at a seat level, and up to Mod A for ADL completion from a sit/stand level.  OT will continue to follow her to maximize her ADL status, and AIR continues to be recommended for post acute rehab.    ? ?Recommendations for follow up therapy are one component of a multi-disciplinary discharge planning process, led by the attending physician.  Recommendations may be updated based on patient status, additional functional criteria and insurance authorization. ?   ?Follow Up Recommendations ? Acute inpatient rehab (3hours/day)  ?  ?Assistance Recommended at Discharge Frequent or constant Supervision/Assistance  ?Patient can return home with the following ? A little help with walking and/or transfers;A lot of help with bathing/dressing/bathroom;Help with stairs or ramp for entrance;Assist for transportation;Assistance with cooking/housework;Direct supervision/assist for financial management;Direct supervision/assist for medications management ?  ?Equipment Recommendations ? Tub/shower seat;BSC/3in1  ?  ?Recommendations for Other Services   ? ?  ?Precautions / Restrictions Precautions ?Precautions: Fall ?Restrictions ?Weight Bearing Restrictions: No   ? ? ?  ? ?Mobility Bed Mobility ?Overal bed mobility: Needs Assistance ?Bed Mobility: Sidelying to Sit ?  ?Sidelying to sit: Supervision, HOB elevated ?  ?  ?  ?  ?  ? ?Transfers ?Overall transfer level: Needs assistance ?  ?Transfers: Sit to/from Stand, Bed to chair/wheelchair/BSC ?Sit to Stand: Min guard ?  ?  ?Step pivot transfers: Min assist ?  ?  ?  ?  ?  ?Balance Overall balance assessment: Needs assistance ?Sitting-balance support: Feet unsupported ?Sitting balance-Leahy Scale: Good ?  ?  ?Standing balance support: Single extremity supported ?Standing balance-Leahy Scale: Poor ?  ?  ?  ?  ?  ?  ?  ?  ?  ?  ?  ?  ?   ? ?ADL either performed or assessed with clinical judgement  ? ?ADL   ?Eating/Feeding: Set up;Sitting ?  ?Grooming: Wash/dry hands;Wash/dry face;Sitting;Set up ?  ?  ?  ?  ?  ?Upper Body Dressing : Moderate assistance;Sitting ?  ?Lower Body Dressing: Moderate assistance;Sit to/from stand ?  ?  ?  ?  ?  ?  ?  ?Functional mobility during ADLs: Minimal assistance ?  ?  ? ?Extremity/Trunk Assessment Upper Extremity Assessment ?Upper Extremity Assessment: RUE deficits/detail ?RUE Deficits / Details: general 2/5 to R UE MMT.  Weak gross grasp, unable to show one or two fingers, difficulty with in hand manipulation.  Can bring hand to mouth. ?RUE Sensation: WNL ?RUE Coordination: decreased gross motor;decreased fine motor ?LUE Coordination: decreased fine motor (non dominate hand) ?  ?Lower Extremity Assessment ?Lower Extremity Assessment: Defer to PT evaluation ?  ?Cervical / Trunk Assessment ?Cervical / Trunk Assessment: Normal ?  ? ?   ?  ?  ?   ?  ?   ?  ? ?  Cognition Arousal/Alertness: Awake/alert ?Behavior During Therapy: [facility] for tasks assessed/performed ?Overall Cognitive Status: Within Functional Limits for tasks assessed ?  ?  ?  ?  ?  ?  ?  ?  ?  ?  ?  ?  ?  ?  ?  ?  ?  ?  ?  ?   ?   ? ?  ?   ? ? ?  ?    ? ? ?Pertinent Vitals/ Pain       Pain Assessment ?Pain Assessment: No/denies pain ? ?   ?   ?  ?  ?  ?  ?  ?  ?  ?  ?  ?  ?  ?  ?  ?  ?  ?  ?  ? ?  ?    ?  ?  ?  ?   ? ?Frequency ? Min 2X/week  ? ? ? ? ?  ?Progress Toward Goals ? ?OT Goals(current goals can now be found in the care plan section) ? Progress towards OT goals: Progressing toward goals ? ?Acute Rehab OT Goals ?OT Goal Formulation: With patient ?Time For Goal Achievement: 06/20/21 ?Potential to Achieve Goals: Good  ?Plan Discharge plan remains appropriate   ? ?Co-evaluation ? ? ?   ?  ?  ?  ?  ? ?  ?AM-PAC OT "6 Clicks" Daily Activity     ?Outcome Measure ? ? Help from another person eating meals?: A Little ?Help from another person taking care of personal grooming?: A Little ?Help from another person toileting, which includes using toliet, bedpan, or urinal?: A Lot ?Help from another person bathing (including washing, rinsing, drying)?: A Lot ?Help from another person to put on and taking off regular upper body clothing?: A Lot ?Help from another person to put on and taking off regular lower body clothing?: A Lot ?6 Click Score: 14 ? ?  ?End of Session Equipment Utilized During Treatment: Gait belt ? ?OT Visit Diagnosis: Unsteadiness on feet (R26.81);Muscle weakness (generalized) (M62.81);Other symptoms and signs involving cognitive function;Hemiplegia and hemiparesis ?Hemiplegia - Right/Left: Right ?Hemiplegia - dominant/non-dominant: Dominant ?Hemiplegia - caused by: Cerebral infarction ?  ?Activity Tolerance Patient tolerated treatment well ?  ?Patient Left in chair;with call bell/phone within reach;with chair alarm set ?  ?Nurse Communication Mobility status ?  ? ?   ? ?Time: [id number] ?OT Time Calculation (min): 24 min ? ?Charges: OT General Charges ?$OT Visit: 1 Visit ?OT Treatments ?$Self Care/Home Management : 23-37 mins ? ?06/15/2021 ? ?RP, OTR/L ? ?Acute Rehabilitation Services ? ?Office:  [phone] ? ? ?Haizel Gatchell D Johngabriel Verde ?06/15/2021, 9:57 AM ?

## 2021-06-15 NOTE — Progress Notes (Signed)
? ? ?Referring Physician(s): CODE STROKE  ? ?Supervising Physician: Julieanne Cotton ? ?Patient Status:  [facility] - In-pt ? 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 Corliss Skains ? ?Subjective: Patient alert and oriented, sitting up in bed. She denies pain or discomfort and appears to be in good spirits. She demonstrates slow speech with continued word finding issues but she is noticeably improved from our list visit.  ? ?Allergies: ?Morphine and Codeine ? ?Medications: ?Prior to Admission medications   ?Medication Sig Start Date End Date Taking? Authorizing Provider  ?atorvastatin (LIPITOR) 20 MG tablet Take 20 mg by mouth at bedtime. 05/19/20  Yes [provider]  ?azelastine (ASTELIN) 0.1 % nasal spray Place 1 spray into both nostrils 2 (two) times daily. Use in each nostril as directed   Yes [provider]  ?busPIRone (BUSPAR) 15 MG tablet TAKE ONE TABLET BY MOUTH TWICE A DAY ?Patient taking differently: Take 15 mg by mouth 2 (two) times daily. 08/22/20  Yes Mliss Sax, MD  ?gabapentin (NEURONTIN) 300 MG capsule TAKE ONE CAPSULE BY MOUTH TWICE A DAY ?Patient taking differently: Take 300 mg by mouth 2 (two) times daily. 05/15/21  Yes Loyola Mast, MD  ?meloxicam (MOBIC) 15 MG tablet Take 15 mg by mouth daily. 05/18/20  Yes [provider]  ?omeprazole (PRILOSEC) 40 MG capsule Take 40 mg by mouth daily. 06/10/16  Yes [provider]  ?oxybutynin (DITROPAN) 5 MG tablet Take 1 tablet (5 mg total) by mouth 3 (three) times daily. 02/27/21  Yes Loyola Mast, MD  ?sertraline (ZOLOFT) 100 MG tablet TAKE ONE AND ONE-HALF TABLETS BY MOUTH ONE TIME DAILY ?Patient taking differently: Take 150 mg by mouth daily. 05/04/21  Yes Loyola Mast, MD  ?solifenacin (VESICARE) 5 MG tablet Take 5 mg by mouth daily.   Yes [provider]  ?traMADol (ULTRAM) 50 MG tablet TAKE ONE TABLET BY MOUTH TWICE A DAY ?Patient taking differently: Take 50 mg by mouth 2 (two) times daily. 05/15/21  Yes Loyola Mast, MD  ?traZODone (DESYREL) 150 MG tablet Take 150 mg by mouth at bedtime.  06/04/16  Yes [provider]  ?albuterol (VENTOLIN HFA) 108 (90 Base) MCG/ACT inhaler INHALE 2 PUFFS INTO THE LUNGS EVERY 6 HOURS AS NEEDED FOR UP TO 30 DAYS FOR WHEEZING ?Patient not taking: Reported on 06/12/2021 09/21/18   [provider]  ?fluticasone (FLONASE) 50 MCG/ACT nasal spray Place 1 spray into both nostrils daily. ?Patient not taking: Reported on 06/12/2021 04/23/21   Loyola Mast, MD  ? ? ? ?Vital Signs: ?BP 112/78 (BP Location: Right Arm)   Pulse 71   Temp 97.9 ?F (36.6 ?C) (Oral)   Resp 18   Ht 5\' 5"  (1.651 m)   Wt 142 lb 10.2 oz (64.7 kg)   SpO2 100%   BMI 23.74 kg/m?  ? ?Physical Exam ?Constitutional:   ?   General: She is not in acute distress. ?   Appearance: She is not ill-appearing.  ?HENT:  ?   Mouth/Throat:  ?   Mouth: Mucous membranes are moist.  ?   Pharynx: Oropharynx is clear.  ?Pulmonary:  ?   Effort: Pulmonary effort is normal.  ?Skin: ?   General: Skin is warm and dry.  ?Neurological:  ?   Mental Status: She is alert and oriented to person, place, and time.  ?  Comments: Expressive aphasia - improved. Right-sided hemiparesis also improved. Patient able to speak in clear, easy to understand words. Occasional difficulty finding the right words.   ? ? ?Imaging: ?CT HEAD WO CONTRAST (5MM) ? ?Result Date: 06/12/2021 ?CLINICAL DATA:  Stroke, follow-up, status post percutaneous intervention EXAM: CT HEAD WITHOUT CONTRAST TECHNIQUE: Contiguous axial images were obtained from the base of the skull through the vertex without intravenous contrast. RADIATION DOSE REDUCTION: This exam was performed according to the departmental dose-optimization program which includes automated exposure control, adjustment of the mA and/or  kV according to patient size and/or use of iterative reconstruction technique. COMPARISON:  CT head 06/12/2021 9:15 a.m, CT head from thrombectomy 06/12/2021 11:30 a.m. FINDINGS: Brain: Hyperdensity in the left MCA territory (series 3, images 19-24), which may represent petechial hemorrhage versus post thrombectomy contrast staining, which appears similar to the CT done immediately after intervention. Along the course of the left MCA, particularly the M1 and M2 segments, there is hyperdensity, which may represent subarachnoid hemorrhage versus contrast extravasation from the left MCA, which also appears similar to the CT done immediately after intervention. No new area of hypodensity to suggest acute infarct. No mass, mass effect, or midline shift. No hydrocephalus. Vascular: No hyperdense vessel. Skull: Normal. Negative for fracture or focal lesion. Sinuses/Orbits: No acute finding. Other: Trace fluid in left mastoid air cells. IMPRESSION: Status post thrombectomy, with hyperdensity in the left MCA territory, favored to represent post thrombectomy contrast staining although petechial hemorrhage could appear similar. Additional hyperdensity along the course of the left M1 and M2, which may represent contrast extravasation from the left MCA versus subarachnoid hemorrhage. These areas of hyperdensity appear similar to the CT done immediately after intervention. Electronically Signed   By: Wiliam KeAlison  Vasan M.D.   On: 06/12/2021 15:55  ? ?MR BRAIN WO CONTRAST ? ?Result Date: 06/13/2021 ?CLINICAL DATA:  Stroke follow-up post thrombectomy EXAM: MRI HEAD WITHOUT CONTRAST TECHNIQUE: Multiplanar, multiecho pulse sequences of the brain and surrounding structures were obtained without intravenous contrast. COMPARISON:  CT and CTA head/neck dated 1 day prior FINDINGS: Brain: There is diffusion restriction in the left caudate body, lentiform nucleus, insula, and parietal and temporal lobes consistent with evolving MCA distribution  infarct likely superimposed on a pre-existing remote MCA distribution infarct. There is no evidence of hemorrhagic transformation. Is no significant mass effect or midline shift. There is no significant SWI signal dropout overlying the infarct territory or within the sylvian fissure or MCA cistern, suggesting of the hyperdensity seen on prior CT primarily reflects contrast staining. There is no evidence of new acute intracranial hemorrhage or extra-axial fluid collection. Background parenchymal volume is normal. The ventricles are normal in size. Parenchymal signal is otherwise normal. There is no solid mass lesion.  There is no midline shift. Vascular: The major intracranial flow voids are present. Skull and upper cervical spine: Normal marrow signal. Sinuses/Orbits: The paranasal sinuses are clear. The globes and orbits are grossly unremarkable. Other: None. IMPRESSION: 1. Scattered diffusion restriction in the MCA territory as described above consistent with evolving acute infarcts. No evidence of hemorrhagic transformation or significant mass effect. 2. No appreciable SWI signal dropout in the regions of hyperdensity seen on the prior CT, suggesting that the hyperdensity primarily reflects contrast staining. Electronically Signed   By: Lesia HausenPeter  Noone M.D.   On: 06/13/2021 11:59  ? ?IR INTRAVSC STENT CERV CAROTID W/O EMB-PROT MOD SED ? ?Result Date: 06/13/2021 ?INDICATION: New onset aphasia, right-sided hemiplegia. Occluded left internal carotid artery proximally, inferior  division of the left middle cerebral artery proximally on CT angiogram of the head and neck. EXAM: 1. EMERGENT LARGE VESSEL OCCLUSION THROMBOLYSIS (anterior CIRCULATION) COMPARISON:  CT angiogram of the head and neck of June 12, 2021. MEDICATIONS: Vancomycin 1 g IV antibiotic was administered within 1 hour of the procedure. ANESTHESIA/SEDATION: General anesthesia CONTRAST:  Omnipaque 300 approximately 85 mL. FLUOROSCOPY TIME:  Fluoroscopy Time:  34 minutes 36 seconds (1891 mGy). COMPLICATIONS: None immediate. TECHNIQUE: Following a full explanation of the procedure along with the potential associated complications, an informed witnessed consent was o

## 2021-06-15 NOTE — Progress Notes (Addendum)
Signed    ? ?   ?   ?   ?   ?   ?   ?   ?   ?   ?   ?   ?   ?   ?   ?   ?   ?   ?   ?   ?   ?   ?   ?   ?   ?   ?   ?   ?   ?   ?   ?   ?   ?   ?   ?   ?   ?   ?   ?   ?   ?   ?   ?   ?   ?   ?   ?   ?   ?   ?   ?   ?   ?   ?   ?   ?   ?   ?   ?   ?   ?   ?   ?   ?   ?   ?   ?   ?   ?   ?   ?   ?   ?   ?   ?   ?   ?   ?   ?   ?   ?   ?   ?   ?   ?   ?   ?   ?   ?   ?   ?   ?   ?   ?   ?   ?   ?   ?   ?   ?   ?   ?   ?   ?   ?   ?   ?   ?   ?   ?   ?   ?   ?   ?   ?   ?   ?   ?   ?   ?   ?   ?   ?   ?   ?   ?   ?   ?   ?   ?   ?   ?   ?   ?PMR Admission Coordinator Pre-Admission Assessment ?  ?[name] is an 61 y.o., female ?MRN: [id number] ?DOB: August 27, 1960 ?Height: 5\' 5"  (165.1 cm) ?Weight: 64.7 kg ?  ?Insurance Information ?HMO: yes    PPO:      PCP:      IPA:      80/20:      OTHER:  ?PRIMARY: [employer]      Policy#: [id number]      Subscriber: pt ?CM Name: approved via fax      Phone#: [phone]     Fax#: [phone] ?Pre-Cert#: [id number] approved for 7 days after notification of initial admit day within 24 hrs of CIR admit Updates due on 06/22/21     Employer:  ?Benefits:  Phone #: [phone]     Name: 3/30 ?Eff. Date: 03/18/2021     Deduct: $1000      Out of Pocket Max: $3000      Life Max: none ?CIR: 85%      SNF: 85% 60 days ?Outpatient: $40 co pay per visit     Co-Pay: 30 combined visits ?Home Health: 85%  Co-Pay: 120 combined visits ?DME: 85%     Co-Pay: 15% ?Providers: in network ? ?SECONDARY: none ?  ?Financial Counselor:       Phone#:  ?  ?The ?Data Collection Information Summary? for patients in Inpatient Rehabilitation Facilities with attached ?Privacy Act Statement-Health Care Records? was provided and verbally reviewed with: N/A ?  ?Emergency Contact Information ?Contact Information   ?  ?  Name Relation Home Work Mobile  ?  Getman,Marc Spouse [phone]      ?  Brantley PersonsWoods,Bryan Son     [phone]  ?  ?   ?  ?Current Medical History  ?Patient Admitting Diagnosis: CVA ?   ?History of Present Illness: 61 year old female with history of COPD, cataracts and PTSD. Presented don 06/12/21 with a fall and difficulty speaking. called EMS who noticed aphasia and right hemiparesis.  On assessment the patient had severe expressive aphasia with a right-sided hemiplegia.  Non contrast CT scan of the head was obtained emergently which showed low-density in the like left insula compatible with acute stroke as well as old left parietal hypodensity compatible with a remote stroke.  The left middle cerebral artery also had hyperdense sign.  Aspect score was 7.  Patient underwent immediate CT angiogram of the brain and CT perfusion.  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  The likely left M2 occlusion CT perfusion showed core infarct of 22 mL with mismatch volume of 38 mL.  Dr. Corliss Skainseveshwar from interventional neuroradiology who agreed with taking the patient for emergent mechanical thrombectomy with TIC13 revascularization. 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 Continue statin at discharge with LDL 51. Advised to quit smoking. ?  ?Complete NIHSS TOTAL: 7 ?  ?[facility] reviewed by the rehabilitation admission coordinator and physician. ?  ?Past Medical History  ?    ?Past Medical History:  ?Diagnosis Date  ? Anxiety    ? Arthritis    ? Bursitis    ? Carpal tunnel syndrome    ? COPD (chronic obstructive pulmonary disease) (HCC)    ? Depression    ? Fibromyalgia    ? Fibromyalgia    ? GERD (gastroesophageal reflux disease)    ? Hemorrhoids 10/27/2014  ? Hyperlipidemia    ? Hypertension    ? Kidney stones    ?  ?Has the patient had major surgery during 100 days prior to admission? Yes ?  ?Family History   ?family history is not on file. ?  ?Current Medications ?  ?Current Facility-Administered Medications:  ?   acetaminophen (TYLENOL) tablet 650 mg, 650 mg, Per Tube, Q4H PRN **OR** acetaminophen (TYLENOL) 160 MG/5ML solution 650 mg, 650 mg, Per Tube, Q4H PRN **OR** acetaminophen (TYLENOL) suppository 650 mg, 650 mg, Rectal, Q4H PRN, Deveshwar, Sanjeev, MD ?  amLODipine (NORVASC) tablet 5 mg, 5 mg, Oral, Daily, Shafer, Devon, NP, 5 mg at 06/14/21 [id number] ?  aspirin chewable tablet 81 mg, 81 mg, Oral, Daily, 81 mg at 06/14/21 0905 **OR** aspirin chewable tablet 81 mg, 81 mg, Per Tube, Daily, Deveshwar, Sanjeev, MD ?  atorvastatin (LIPITOR) tablet 20 mg, 20 mg, Oral, Daily, Micki RileySethi, Pramod S, MD, 20 mg at 06/14/21 1407 ?  busPIRone (BUSPAR) tablet 15 mg, 15 mg, Oral, BID, Aslam, Sadia, MD, 15 mg at 06/14/21 0905 ?  enoxaparin (LOVENOX) injection 40 mg, 40 mg,  Subcutaneous, Q24H, Garvin Fila, MD, 40 mg at 06/14/21 [id number] ?  haloperidol lactate (HALDOL) injection 1 mg, 1 mg, Intravenous, Once PRN, Janine Ores, NP ?  hydrOXYzine (ATARAX) tablet 25 mg, 25 mg, Oral, TID PRN, Marva Panda, Sadia, MD, 25 mg at 06/13/21 1000 ?  iohexol (OMNIPAQUE) 300 MG/ML solution 100 mL, 100 mL, Intra-arterial, Once PRN, Deveshwar, Sanjeev, MD ?  MEDLINE mouth rinse, 15 mL, Mouth Rinse, BID, Garvin Fila, MD, 15 mL at 06/14/21 0906 ?  senna-docusate (Senokot-S) tablet 1 tablet, 1 tablet, Oral, QHS PRN, Garvin Fila, MD ?  sertraline (ZOLOFT) tablet 150 mg, 150 mg, Oral, Daily, Aslam, Sadia, MD, 150 mg at 06/14/21 0906 ?  sodium chloride flush (NS) 0.9 % injection 3 mL, 3 mL, Intravenous, Once, Carmin Muskrat, MD ?  ticagrelor [facility]) tablet 90 mg, 90 mg, Oral, BID, 90 mg at 06/14/21 0905 **OR** ticagrelor (BRILINTA) tablet 90 mg, 90 mg, Per Tube, BID, Deveshwar, Sanjeev, MD, 90 mg at 06/12/21 1653 ?  traMADol (ULTRAM) tablet 50 mg, 50 mg, Oral, BID, Aslam, Sadia, MD, 50 mg at 06/14/21 0905 ?  traZODone (DESYREL) tablet 150 mg, 150 mg, Oral, QHS, Aslam, Sadia, MD, 150 mg at 06/13/21 2113 ?  ?Patients Current Diet:  ?Diet Order   ?  ?         ?     Diet Heart Room service appropriate? Yes; Fluid consistency: Thin  Diet effective now       ?  ?  ?   ?  ?  ?   ?  ?Precautions / Restrictions ?Precautions ?Precautions: Fall ?Restrictions ?Weight Bearing Restrictions: No  ?  ?Has the patient had 2 or more falls or a fall with injury in the past year? No ?  ?Prior Activity Level ?Community (5-7x/wk): independent, works part time at [employer], drives ?  ?Prior Functional Level ?Self Care: Did the patient need help bathing, dressing, using the toilet or eating? Independent ?  ?Indoor Mobility: Did the patient need assistance with walking from room to room (with or without device)? Independent ?  ?Stairs: Did the patient need assistance with internal or external stairs (with or without device)? Independent ?  ?Functional Cognition: Did the patient need help planning regular tasks such as shopping or remembering to take medications? Independent ?  ?Patient Information ?Are you of Hispanic, Latino/a,or Spanish origin?: A. No, not of Hispanic, Latino/a, or Spanish origin ?What is your race?: A. White ?Do you need or want an interpreter to communicate with a doctor or health care staff?: 0. No ?  ?Patient's Response To:  ?Health Literacy and Transportation ?Is the patient able to respond to health literacy and transportation needs?: Yes ?Health Literacy - How often do you need to have someone help you when you read instructions, pamphlets, or other written material from your doctor or pharmacy?: Never ?In the past 12 months, has lack of transportation kept you from medical appointments or from getting medications?: No ?In the past 12 months, has lack of transportation kept you from meetings, work, or from getting things needed for daily living?: No ?  ?Home Assistive Devices / Equipment ?Home Equipment: None ?  ?Prior Device Use: Indicate devices/aids used by the patient prior to current illness, exacerbation or injury? None of the above ?  ?Current Functional  Level ?Cognition ?  Arousal/Alertness: Awake/alert ?Overall Cognitive Status: Impaired/Different from baseline ?Difficult to assess due to: Impaired communication ?Orientation Level: Oriented to person, Oriented to place, Orient

## 2021-06-15 NOTE — Progress Notes (Incomplete)
Observed on initial round patient appears asleep,  ?

## 2021-06-15 NOTE — H&P (Signed)
? ? ?Physical Medicine and Rehabilitation Admission H&P ? ?  ?Chief Complaint  ?Patient presents with  ? Code Stroke  ?: ?HPI: Susan Lewis is a 61 year old right-handed female with history of COPD/tobacco use, PTSD, hyperlipidemia, hypertension.  Per chart review patient lives with spouse.  Independent prior to admission.  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  Presented 06/12/2021 with aphasia and right-sided weakness.  Reportedly she fell trying to get out of bed.  Family denied loss of consciousness.  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  No hemorrhage or mass effect.  CT angiogram head and neck positive for tandem emergent large vessel occlusions.  Left ICA in the neck and posterior M2 left MCA branch at the bifurcation.  CTP detects a 22 mL left MCA infarct core with 60 mL penumbra.  Admission chemistries unremarkable except creatinine 1.02.  Patient underwent mechanical thrombectomy with TICI3 revascularization per interventional radiology.  Follow-up MRI showed scattered diffusion restriction in the MCA territory.  No evidence of hemorrhagic transformation.  No significant mass effect.  Echocardiogram with ejection fraction of 70% no wall motion abnormalities grade 1 diastolic dysfunction.  Currently maintained on aspirin 81 mg daily and Brilinta 90 mg twice daily for 6 months then aspirin alone.  She was cleared to begin Lovenox for DVT prophylaxis.  Tolerating a regular diet.  Therapy evaluations completed due to patient's right side weakness and aphasia was admitted for a comprehensive rehab program. ? ?Review of Systems  ?Constitutional:  Negative for chills and fever.  ?HENT:  Negative for hearing loss.   ?Eyes:  Negative for blurred vision and double vision.  ?Respiratory:  Negative for cough and shortness of breath.   ?Cardiovascular:  Negative for chest pain and palpitations.   ?Gastrointestinal:  Positive for constipation. Negative for heartburn, nausea and vomiting.  ?     GERD  ?Genitourinary:  Negative for dysuria, flank pain and hematuria.  ?Musculoskeletal:  Positive for myalgias.  ?Skin:  Negative for rash.  ?Neurological:  Positive for speech change and weakness.  ?Psychiatric/Behavioral:  Positive for depression.   ?     Anxiety/PTSD  ?All other systems reviewed and are negative. ?Past Medical History:  ?Diagnosis Date  ? Anxiety   ? Arthritis   ? Bursitis   ? Carpal tunnel syndrome   ? COPD (chronic obstructive pulmonary disease) (HCC)   ? Depression   ? Fibromyalgia   ? Fibromyalgia   ? GERD (gastroesophageal reflux disease)   ? Hemorrhoids 10/27/2014  ? Hyperlipidemia   ? Hypertension   ? Kidney stones   ? ?Past Surgical History:  ?Procedure Laterality Date  ? CARPAL TUNNEL RELEASE Right   ? carpel tunnel   ? CESAREAN SECTION    ? CHOLECYSTECTOMY    ? ENDOMETRIAL ABLATION    ? EXCISIONAL HEMORRHOIDECTOMY    ? IR ANGIO EXTRACRAN SEL COM CAROTID INNOMINATE UNI R MOD SED  06/12/2021  ? IR CT HEAD LTD  06/12/2021  ? IR CT HEAD LTD  06/12/2021  ? IR INTRAVSC STENT CERV CAROTID W/O EMB-PROT MOD SED INC ANGIO  06/12/2021  ? IR PERCUTANEOUS ART THROMBECTOMY/INFUSION INTRACRANIAL INC DIAG ANGIO  06/12/2021  ? OOPHORECTOMY Left   ? ORIF ANKLE FRACTURE BIMALLEOLAR    ? RADIOLOGY WITH ANESTHESIA N/A 06/12/2021  ? Procedure: IR WITH ANESTHESIA;  Surgeon: Radiologist, Medication, MD;  Location: [facility] OR;  Service: Radiology;  Laterality: N/A;  ? ?No family history on file. ?Social History:  reports that she has been smoking cigarettes. She started smoking about 49 years ago. She has a 45.00 pack-year smoking history. She has never used smokeless tobacco. She reports current drug use. Drug: Marijuana. She reports that she does not drink alcohol. ?Allergies:  ?Allergies  ?Allergen Reactions  ? Morphine Other (See Comments)  ?  Hallucination  ? Codeine Itching  ? ?Medications Prior to Admission   ?Medication Sig Dispense Refill  ? atorvastatin (LIPITOR) 20 MG tablet Take 20 mg by mouth at bedtime.    ? azelastine (ASTELIN) 0.1 % nasal spray Place 1 spray into both nostrils 2 (two) times daily. Use in each nostril as directed    ? busPIRone (BUSPAR) 15 MG tablet TAKE ONE TABLET BY MOUTH TWICE A DAY (Patient taking differently: Take 15 mg by mouth 2 (two) times daily.) 60 tablet 5  ? gabapentin (NEURONTIN) 300 MG capsule TAKE ONE CAPSULE BY MOUTH TWICE A DAY (Patient taking differently: Take 300 mg by mouth 2 (two) times daily.) 180 capsule 3  ? meloxicam (MOBIC) 15 MG tablet Take 15 mg by mouth daily.    ? omeprazole (PRILOSEC) 40 MG capsule Take 40 mg by mouth daily.  0  ? oxybutynin (DITROPAN) 5 MG tablet Take 1 tablet (5 mg total) by mouth 3 (three) times daily. 90 tablet 2  ? sertraline (ZOLOFT) 100 MG tablet TAKE ONE AND ONE-HALF TABLETS BY MOUTH ONE TIME DAILY (Patient taking differently: Take 150 mg by mouth daily.) 135 tablet 3  ? solifenacin (VESICARE) 5 MG tablet Take 5 mg by mouth daily.    ? traMADol (ULTRAM) 50 MG tablet TAKE ONE TABLET BY MOUTH TWICE A DAY (Patient taking differently: Take 50 mg by mouth 2 (two) times daily.) 60 tablet 2  ? traZODone (DESYREL) 150 MG tablet Take 150 mg by mouth at bedtime.     ? albuterol (VENTOLIN HFA) 108 (90 Base) MCG/ACT inhaler INHALE 2 PUFFS INTO THE LUNGS EVERY 6 HOURS AS NEEDED FOR UP TO 30 DAYS FOR WHEEZING (Patient not taking: Reported on 06/12/2021)    ? fluticasone (FLONASE) 50 MCG/ACT nasal spray Place 1 spray into both nostrils daily. (Patient not taking: Reported on 06/12/2021) 9.9 mL 5  ? ? ? ? ?Home: ?Home Living ?Family/patient expects to be discharged to:: Private residence ?Living Arrangements: Spouse/significant other, Children ?Available Help at Discharge: Family, Available 24 hours/day ?Type of Home: House ?Home Access: Stairs to enter ?Entrance Stairs-Number of Steps: 4 ?Entrance Stairs-Rails: Can reach both ?Home Layout: One  level ?Bathroom Shower/Tub: Tub/shower unit ?Bathroom Toilet: Standard ?Bathroom Accessibility: Yes ?Home Equipment: None ? Lives With: Spouse ?  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 ?Functional Status:  ?Mobility: ?Bed Mobility ?Overal bed mobility: Needs Assistance ?Bed Mobility: Supine to Sit, Sit to Supine ?Supine to sit: Supervision ?Sit to supine: Min guard ?Transfers ?Overall transfer level: Needs assistance ?Equipment used: None ?Transfers: Sit to/from Stand ?Sit to Stand: Min guard ?Bed to/from chair/wheelchair/BSC transfer type:: Step pivot ?Step pivot transfers: Min assist ?Ambulation/Gait ?Ambulation/Gait assistance: Mod assist ?Gait Distance (Feet): 10 Feet (10' x 2) ?Assistive device: None ?Gait Pattern/deviations: Step-through pattern ?General Gait Details: pt with slowed step-through gait, increased postural sway with multiple posterior and lateral losses of balance. Pt with orthostatic symptoms when ambulating back to bed, requiring increased assistance ?Gait velocity: reduced ?Gait velocity  interpretation: <1.8 ft/sec, indicate of risk for recurrent falls ?  ? ?ADL: ?ADL ?Overall ADL's : Needs assistance/impaired ?Grooming: Armed forces technical officerWash/dry hands, Therapist, nutritionalWash/dry face, Min guard, Sitting ?Upper Body Bathing: Moderate assistance, Sitting ?Lower Body Bathing: Maximal assistance, Sitting/lateral leans ?Upper Body Dressing : Moderate assistance, Sitting ?Lower Body Dressing: Maximal assistance, Sit to/from stand ?Functional mobility during ADLs: Minimal assistance ? ?Cognition: ?Cognition ?Overall Cognitive Status: Impaired/Different from baseline ?Arousal/Alertness: Awake/alert ?Orientation Level: Disoriented to situation ?Attention: Focused ?Focused Attention: Appears intact ?Awareness: Impaired ?Awareness Impairment: Intellectual impairment ?Problem Solving: Impaired ?Problem Solving  Impairment: Verbal basic, Functional basic ?Safety/Judgment: Appears intact ?Cognition ?Arousal/Alertness: Awake/alert ?Behavior During Therapy: Impulsive ?Overall Cognitive Status: Impaired/Different from baseline ?Area of Impairment:

## 2021-06-15 NOTE — Plan of Care (Signed)
?  Problem: Safety: ?Goal: Non-violent Restraint(s) ?Outcome: Completed/Met ?  ?Problem: Education: ?Goal: Knowledge of disease or condition will improve ?Outcome: Progressing ?Goal: Knowledge of patient specific risk factors will improve (INDIVIDUALIZE FOR PATIENT) ?Outcome: Progressing ?  ?Problem: Education: ?Goal: Knowledge of disease or condition will improve ?Outcome: Progressing ?  ?Problem: Coping: ?Goal: Will verbalize positive feelings about self ?Outcome: Progressing ?  ?

## 2021-06-15 NOTE — H&P (Signed)
?  ?Physical Medicine and Rehabilitation Admission H&P ?  ?  ?   ?Chief Complaint  ?Patient presents with  ? Code Stroke  ?: ?HPI: Susan Lewis is a 61 year old right-handed female with history of COPD/tobacco use, PTSD, hyperlipidemia, hypertension.  Per chart review patient lives with spouse.  Independent prior to admission.  Works as a Conservation officer, naturecashier.  1 level home with 4 steps to entry.  Presented 06/12/2021 with aphasia and right-sided weakness.  Reportedly she fell trying to get out of bed.  Family denied loss of consciousness.  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  No hemorrhage or mass effect.  CT angiogram head and neck positive for tandem emergent large vessel occlusions.  Left ICA in the neck and posterior M2 left MCA branch at the bifurcation.  CTP detects a 22 mL left MCA infarct core with 60 mL penumbra.  Admission chemistries unremarkable except creatinine 1.02.  Patient underwent mechanical thrombectomy with TICI3 revascularization per interventional radiology.  Follow-up MRI showed scattered diffusion restriction in the MCA territory.  No evidence of hemorrhagic transformation.  No significant mass effect.  Echocardiogram with ejection fraction of 70% no wall motion abnormalities grade 1 diastolic dysfunction.  Currently maintained on aspirin 81 mg daily and Brilinta 90 mg twice daily for 6 months then aspirin alone.  She was cleared to begin Lovenox for DVT prophylaxis.  Tolerating a regular diet.  Therapy evaluations completed due to patient's right side weakness and aphasia was admitted for a comprehensive rehab program. ?  ?Review of Systems  ?Constitutional:  Negative for chills and fever.  ?HENT:  Negative for hearing loss.   ?Eyes:  Negative for blurred vision and double vision.  ?Respiratory:  Negative for cough and shortness of breath.   ?Cardiovascular:  Negative for chest pain and palpitations.   ?Gastrointestinal:  Positive for constipation. Negative for heartburn, nausea and vomiting.  ?     GERD  ?Genitourinary:  Negative for dysuria, flank pain and hematuria.  ?Musculoskeletal:  Positive for myalgias.  ?Skin:  Negative for rash.  ?Neurological:  Positive for speech change and weakness.  ?Psychiatric/Behavioral:  Positive for depression.   ?     Anxiety/PTSD  ?All other systems reviewed and are negative. ?    ?Past Medical History:  ?Diagnosis Date  ? Anxiety    ? Arthritis    ? Bursitis    ? Carpal tunnel syndrome    ? COPD (chronic obstructive pulmonary disease) (HCC)    ? Depression    ? Fibromyalgia    ? Fibromyalgia    ? GERD (gastroesophageal reflux disease)    ? Hemorrhoids 10/27/2014  ? Hyperlipidemia    ? Hypertension    ? Kidney stones    ?  ?     ?Past Surgical History:  ?Procedure Laterality Date  ? CARPAL TUNNEL RELEASE Right    ?  carpel tunnel   ? CESAREAN SECTION      ? CHOLECYSTECTOMY      ? ENDOMETRIAL ABLATION      ? EXCISIONAL HEMORRHOIDECTOMY      ? IR ANGIO EXTRACRAN SEL COM CAROTID INNOMINATE UNI R MOD SED   06/12/2021  ? IR CT HEAD LTD   06/12/2021  ? IR CT HEAD LTD   06/12/2021  ? IR INTRAVSC STENT CERV CAROTID W/O EMB-PROT MOD SED INC ANGIO   06/12/2021  ? IR PERCUTANEOUS ART THROMBECTOMY/INFUSION  INTRACRANIAL INC DIAG ANGIO   06/12/2021  ? OOPHORECTOMY Left    ? ORIF ANKLE FRACTURE BIMALLEOLAR      ? RADIOLOGY WITH ANESTHESIA N/A 06/12/2021  ?  Procedure: IR WITH ANESTHESIA;  Surgeon: Radiologist, Medication, MD;  Location: [facility] OR;  Service: Radiology;  Laterality: N/A;  ?  ?No family history on file. ?Social History:  reports that she has been smoking cigarettes. She started smoking about 49 years ago. She has a 45.00 pack-year smoking history. She has never used smokeless tobacco. She reports current drug use. Drug: Marijuana. She reports that she does not drink alcohol. ?Allergies:  ?     ?Allergies  ?Allergen Reactions  ? Morphine Other (See Comments)  ?    Hallucination  ? Codeine  Itching  ?  ?      ?Medications Prior to Admission  ?Medication Sig Dispense Refill  ? atorvastatin (LIPITOR) 20 MG tablet Take 20 mg by mouth at bedtime.      ? azelastine (ASTELIN) 0.1 % nasal spray Place 1 spray into both nostrils 2 (two) times daily. Use in each nostril as directed      ? busPIRone (BUSPAR) 15 MG tablet TAKE ONE TABLET BY MOUTH TWICE A DAY (Patient taking differently: Take 15 mg by mouth 2 (two) times daily.) 60 tablet 5  ? gabapentin (NEURONTIN) 300 MG capsule TAKE ONE CAPSULE BY MOUTH TWICE A DAY (Patient taking differently: Take 300 mg by mouth 2 (two) times daily.) 180 capsule 3  ? meloxicam (MOBIC) 15 MG tablet Take 15 mg by mouth daily.      ? omeprazole (PRILOSEC) 40 MG capsule Take 40 mg by mouth daily.   0  ? oxybutynin (DITROPAN) 5 MG tablet Take 1 tablet (5 mg total) by mouth 3 (three) times daily. 90 tablet 2  ? sertraline (ZOLOFT) 100 MG tablet TAKE ONE AND ONE-HALF TABLETS BY MOUTH ONE TIME DAILY (Patient taking differently: Take 150 mg by mouth daily.) 135 tablet 3  ? solifenacin (VESICARE) 5 MG tablet Take 5 mg by mouth daily.      ? traMADol (ULTRAM) 50 MG tablet TAKE ONE TABLET BY MOUTH TWICE A DAY (Patient taking differently: Take 50 mg by mouth 2 (two) times daily.) 60 tablet 2  ? traZODone (DESYREL) 150 MG tablet Take 150 mg by mouth at bedtime.       ? albuterol (VENTOLIN HFA) 108 (90 Base) MCG/ACT inhaler INHALE 2 PUFFS INTO THE LUNGS EVERY 6 HOURS AS NEEDED FOR UP TO 30 DAYS FOR WHEEZING (Patient not taking: Reported on 06/12/2021)      ? fluticasone (FLONASE) 50 MCG/ACT nasal spray Place 1 spray into both nostrils daily. (Patient not taking: Reported on 06/12/2021) 9.9 mL 5  ?  ?  ?  ?  ?Home: ?Home Living ?Family/patient expects to be discharged to:: Private residence ?Living Arrangements: Spouse/significant other, Children ?Available Help at Discharge: Family, Available 24 hours/day ?Type of Home: House ?Home Access: Stairs to enter ?Entrance Stairs-Number of Steps:  4 ?Entrance Stairs-Rails: Can reach both ?Home Layout: One level ?Bathroom Shower/Tub: Tub/shower unit ?Bathroom Toilet: Standard ?Bathroom Accessibility: Yes ?Home Equipment: None ? Lives With: Spouse ?  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  ?Functional Status:  ?Mobility: ?Bed Mobility ?Overal bed mobility: Needs Assistance ?Bed Mobility: Supine to Sit, Sit to Supine ?Supine to sit: Supervision ?Sit to supine: Min guard ?Transfers ?Overall transfer level: Needs assistance ?  Equipment used: None ?Transfers: Sit to/from Stand ?Sit to Stand: Min guard ?Bed to/from chair/wheelchair/BSC transfer type:: Step pivot ?Step pivot transfers: Min assist ?Ambulation/Gait ?Ambulation/Gait assistance: Mod assist ?Gait Distance (Feet): 10 Feet (10' x 2) ?Assistive device: None ?Gait Pattern/deviations: Step-through pattern ?General Gait Details: pt with slowed step-through gait, increased postural sway with multiple posterior and lateral losses of balance. Pt with orthostatic symptoms when ambulating back to bed, requiring increased assistance ?Gait velocity: reduced ?Gait velocity interpretation: <1.8 ft/sec, indicate of risk for recurrent falls ?  ?ADL: ?ADL ?Overall ADL's : Needs assistance/impaired ?Grooming: Armed forces technical officer, Therapist, nutritional, Min guard, Sitting ?Upper Body Bathing: Moderate assistance, Sitting ?Lower Body Bathing: Maximal assistance, Sitting/lateral leans ?Upper Body Dressing : Moderate assistance, Sitting ?Lower Body Dressing: Maximal assistance, Sit to/from stand ?Functional mobility during ADLs: Minimal assistance ?  ?Cognition: ?Cognition ?Overall Cognitive Status: Impaired/Different from baseline ?Arousal/Alertness: Awake/alert ?Orientation Level: Disoriented to situation ?Attention: Focused ?Focused Attention: Appears intact ?Awareness: Impaired ?Awareness Impairment: Intellectual  impairment ?Problem Solving: Impaired ?Problem Solving Impairment: Verbal basic, Functional basic ?Safety/Judgment: Appears intact ?Cognition ?Arousal/Alertness: Awake/alert ?Behavior During Therapy: Impulsi

## 2021-06-15 NOTE — Progress Notes (Signed)
Inpatient Rehabilitation Admission Medication Review by a Pharmacist ? ?A complete drug regimen review was completed for this patient to identify any potential clinically significant medication issues. ? ?High Risk Drug Classes Is patient taking? Indication by Medication  ?Antipsychotic No   ?Anticoagulant Yes Lovenox for VTE ppx  ?Antibiotic No   ?Opioid Yes Tramadol for pain  ?Antiplatelet Yes Brilinta, aspirin s/p CVA stent  ?Hypoglycemics/insulin No   ?Vasoactive Medication Yes Amlodipine for BP  ?Chemotherapy No   ?Other Yes Atorvastatin for HLD ?Zoloft, Buspar for anxiety  ? ? ? ?Type of Medication Issue Identified Description of Issue Recommendation(s)  ?Drug Interaction(s) (clinically significant) ?    ?Duplicate Therapy ?    ?Allergy ?    ?No Medication Administration End Date ?    ?Incorrect Dose ?    ?Additional Drug Therapy Needed ?    ?Significant med changes from prior encounter (inform family/care partners about these prior to discharge). Ditropan ?Vesicare ?Trazodone for sleep  Resume if and when appropriate  ?Other ?    ? ? ?Clinically significant medication issues were identified that warrant physician communication and completion of prescribed/recommended actions by midnight of the next day:  No ? ?Pharmacist comments: None ? ?Time spent performing this drug regimen review (minutes):  20 minutes ? ? ?Elwin Sleight ?06/15/2021 2:21 PM ?

## 2021-06-15 NOTE — Discharge Summary (Addendum)
Stroke Discharge Summary  ?Patient ID: Susan Lewis    l ?  MRN: [id number]    ?  DOB: 07/18/1960 ? ?Date of Admission: 06/12/2021 ?Date of Discharge: 06/15/2021 ? ?Attending Physician:  Micki RileySethi, Alyssandra Hulsebus S, MD , Stroke MD ?Consultant(s):   pulmonary/intensive care, rehabilitation medicine, and vascular surgery ?Patient's PCP:  Loyola Mastudd, Stephen M, MD ? ?Discharge Diagnoses:  ?Stroke: Left frontal insular infarct likely secondary due to vessel to vessel embolism in the setting of left M2 branch occlusion and left carotid occlusion s/p mechanical thrombectomy with TICI3 revascularization and stent-assisted angioplasty 06/12/21 with Dr. Corliss Skainseveshwar ?Risk factors: HTN, HLD, CAD, Age, Tobacco abuse, cannabis abuse ?Principal Problem: ?  Stroke [facility](HCC) ?Active Problems: ?  Middle cerebral artery embolism ?  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 ? ?Medications to be continued on Rehab ?Allergies as of 06/15/2021   ? ?   Reactions  ? Morphine Other (See Comments)  ? Hallucination  ? Codeine Itching  ? ?  ? ?  ?Medication List  ?  ? ?STOP taking these medications   ? ?meloxicam 15 MG tablet ?Commonly known as: MOBIC ?  ? ?  ? ?TAKE these medications   ? ?albuterol 108 (90 Base) MCG/ACT inhaler ?Commonly known as: VENTOLIN HFA ?INHALE 2 PUFFS INTO THE LUNGS EVERY 6 HOURS AS NEEDED FOR UP TO 30 DAYS FOR WHEEZING ?  ?amLODipine 5 MG tablet ?Commonly known as: NORVASC ?Take 1 tablet (5 mg total) by mouth daily. ?Start taking on: June 16, 2021 ?  ?aspirin 81 MG chewable tablet ?Chew 1 tablet (81 mg total) by mouth daily. ?Start taking on: June 16, 2021 ?  ?atorvastatin 20 MG tablet ?Commonly known as: LIPITOR ?Take 1 tablet (20 mg total) by mouth at bedtime. ?  ?azelastine 0.1 % nasal spray ?Commonly known as: ASTELIN ?Place 1 spray into both nostrils 2 (two) times daily. Use in each nostril as directed ?  ?busPIRone 15 MG tablet ?Commonly known as: BUSPAR ?TAKE ONE TABLET BY MOUTH TWICE A  DAY ?  ?fluticasone 50 MCG/ACT nasal spray ?Commonly known as: FLONASE ?Place 1 spray into both nostrils daily. ?  ?gabapentin 300 MG capsule ?Commonly known as: NEURONTIN ?TAKE ONE CAPSULE BY MOUTH TWICE A DAY ?  ?omeprazole 40 MG capsule ?Commonly known as: PRILOSEC ?Take 40 mg by mouth daily. ?  ?oxybutynin 5 MG tablet ?Commonly known as: DITROPAN ?Take 1 tablet (5 mg total) by mouth 3 (three) times daily. ?  ?sertraline 100 MG tablet ?Commonly known as: ZOLOFT ?Take 1.5 tablets (150 mg total) by mouth daily. ?Start taking on: June 16, 2021 ?What changed: See the new instructions. ?  ?solifenacin 5 MG tablet ?Commonly known as: VESICARE ?Take 5 mg by mouth daily. ?  ?ticagrelor 90 MG Tabs tablet ?Commonly known as: BRILINTA ?Take 1 tablet (90 mg total) by mouth 2 (two) times daily. ?  ?traMADol 50 MG tablet ?Commonly known as: ULTRAM ?TAKE ONE TABLET BY MOUTH TWICE A DAY ?  ?traZODone 150 MG tablet ?Commonly known as: DESYREL ?Take 150 mg by mouth at bedtime. ?  ? ?  ? ?  ?  ? ? ?  ?Durable Medical Equipment  ?(From admission, onward)  ?  ? ? ?  ? ?  Start     Ordered  ? 06/15/21 1144  DME 3-in-1  Once       ? 06/15/21 1146  ? 06/15/21 1144  DME Tub Bench  Once       ?  06/15/21 1146  ? 06/15/21 1144  DME Shower stool  Once       ? 06/15/21 1146  ? ?  ?  ? ?  ? ? ?LABORATORY STUDIES ?CBC ?   ?Component Value Date/Time  ? WBC 7.0 06/14/2021 0833  ? RBC 4.00 06/14/2021 0833  ? HGB 12.0 06/14/2021 0833  ? HCT 35.4 (L) 06/14/2021 0263  ? PLT 169 06/14/2021 0833  ? MCV 88.5 06/14/2021 0833  ? MCH 30.0 06/14/2021 0833  ? MCHC 33.9 06/14/2021 0833  ? RDW 14.3 06/14/2021 0833  ? LYMPHSABS 1.6 06/13/2021 0217  ? MONOABS 0.9 06/13/2021 0217  ? EOSABS 0.0 06/13/2021 0217  ? BASOSABS 0.0 06/13/2021 0217  ? ?CMP ?   ?Component Value Date/Time  ? NA 140 06/14/2021 0833  ? K 3.4 (L) 06/14/2021 7858  ? CL 107 06/14/2021 0833  ? CO2 25 06/14/2021 0833  ? GLUCOSE 90 06/14/2021 0833  ? BUN 12 06/14/2021 0833  ? CREATININE 0.94  06/14/2021 0833  ? CALCIUM 8.9 06/14/2021 0833  ? PROT 6.1 (L) 06/12/2021 0909  ? ALBUMIN 3.7 06/12/2021 0909  ? AST 19 06/12/2021 0909  ? ALT 19 06/12/2021 0909  ? ALKPHOS 68 06/12/2021 0909  ? BILITOT 0.7 06/12/2021 0909  ? GFRNONAA >60 06/14/2021 8502  ? GFRAA >60 10/21/2017 0431  ? ?COAGS ?Lab Results  ?Component Value Date  ? INR 1.0 06/12/2021  ? ?Lipid Panel ?   ?Component Value Date/Time  ? CHOL 111 06/13/2021 0217  ? TRIG 145 06/13/2021 0217  ? TRIG 136 06/13/2021 0217  ? HDL 31 (L) 06/13/2021 0217  ? CHOLHDL 3.6 06/13/2021 0217  ? VLDL 29 06/13/2021 0217  ? LDLCALC 51 06/13/2021 0217  ? LDLCALC 77 06/28/2020 1616  ? ?HgbA1C  ?Lab Results  ?Component Value Date  ? HGBA1C 5.1 06/13/2021  ? ?Urinalysis ?No results found for: COLORURINE, APPEARANCEUR, LABSPEC, PHURINE, GLUCOSEU, HGBUR, BILIRUBINUR, KETONESUR, PROTEINUR, UROBILINOGEN, NITRITE, LEUKOCYTESUR ?Urine Drug Screen  ?   ?Component Value Date/Time  ? LABOPIA NONE DETECTED 10/21/2017 0650  ? COCAINSCRNUR POSITIVE (A) 10/21/2017 0650  ? LABBENZ NONE DETECTED 10/21/2017 0650  ? AMPHETMU NONE DETECTED 10/21/2017 0650  ? THCU POSITIVE (A) 10/21/2017 0650  ? LABBARB NONE DETECTED 10/21/2017 0650  ?  ?Alcohol Level ?No results found for: ETH ? ? ?SIGNIFICANT DIAGNOSTIC STUDIES ?CT HEAD WO CONTRAST ( ) ? ?Result Date: 06/12/2021 ?CLINICAL DATA:  Stroke, follow-up, status post percutaneous intervention EXAM: CT HEAD WITHOUT CONTRAST TECHNIQUE: Contiguous axial images were obtained from the base of the skull through the vertex without intravenous contrast. RADIATION DOSE REDUCTION: This exam was performed according to the departmental dose-optimization program which includes automated exposure control, adjustment of the mA and/or kV according to patient size and/or use of iterative reconstruction technique. COMPARISON:  CT head 06/12/2021 9:15 a.m, CT head from thrombectomy 06/12/2021 11:30 a.m. FINDINGS: Brain: Hyperdensity in the left MCA territory (series 3,  images 19-24), which may represent petechial hemorrhage versus post thrombectomy contrast staining, which appears similar to the CT done immediately after intervention. Along the course of the left MCA, particularly the M1 and M2 segments, there is hyperdensity, which may represent subarachnoid hemorrhage versus contrast extravasation from the left MCA, which also appears similar to the CT done immediately after intervention. No new area of hypodensity to suggest acute infarct. No mass, mass effect, or midline shift. No hydrocephalus. Vascular: No hyperdense vessel. Skull: Normal. Negative for fracture or focal lesion. Sinuses/Orbits: No acute finding. Other: Trace fluid  in left mastoid air cells. IMPRESSION: Status post thrombectomy, with hyperdensity in the left MCA territory, favored to represent post thrombectomy contrast staining although petechial hemorrhage could appear similar. Additional hyperdensity along the course of the left M1 and M2, which may represent contrast extravasation from the left MCA versus subarachnoid hemorrhage. These areas of hyperdensity appear similar to the CT done immediately after intervention. Electronically Signed   By: Wiliam Ke M.D.   On: 06/12/2021 15:55  ? ?MR BRAIN WO CONTRAST ? ?Result Date: 06/13/2021 ?CLINICAL DATA:  Stroke follow-up post thrombectomy EXAM: MRI HEAD WITHOUT CONTRAST TECHNIQUE: Multiplanar, multiecho pulse sequences of the brain and surrounding structures were obtained without intravenous contrast. COMPARISON:  CT and CTA head/neck dated 1 day prior FINDINGS: Brain: There is diffusion restriction in the left caudate body, lentiform nucleus, insula, and parietal and temporal lobes consistent with evolving MCA distribution infarct likely superimposed on a pre-existing remote MCA distribution infarct. There is no evidence of hemorrhagic transformation. Is no significant mass effect or midline shift. There is no significant SWI signal dropout overlying the  infarct territory or within the sylvian fissure or MCA cistern, suggesting of the hyperdensity seen on prior CT primarily reflects contrast staining. There is no evidence of new acute intracranial hemorrha

## 2021-06-15 NOTE — Progress Notes (Signed)
Inpatient Rehab Admissions Coordinator:  ?There is a bed available in CIR for pt to admit today. Dr. Pearlean Brownie aware and in agreement. Pt, NSG, and TOC made. Left a message for husband; awaiting return call. ? ? ?Wolfgang Phoenix, MS, CCC-SLP ?Admissions Coordinator ?[phone] ? ?

## 2021-06-15 NOTE — TOC Transition Note (Signed)
Transition of Care (TOC) - CM/SW Discharge Note ? ? ?Patient Details  ?Name: Susan Lewis ?MRN: [id number] ?Date of Birth: 1960-05-24 ? ?Transition of Care (TOC) CM/SW Contact:  ?Pollie Friar, RN ?Phone Number: ?06/15/2021, 11:54 AM ? ? ?Clinical Narrative:    ?Patient is discharging to CIR today. CM signing off.  ? ? ?Final next level of care: [place] ?Barriers to Discharge: No Barriers Identified ? ? ?Patient Goals and CMS Choice ?  ?  ?Choice offered to / list presented to : Patient ? ?Discharge Placement ?  ?           ?  ?  ?  ?  ? ?Discharge Plan and Services ?  ?  ?           ?  ?  ?  ?  ?  ?  ?  ?  ?  ?  ? ?Social Determinants of Health (SDOH) Interventions ?  ? ? ?Readmission Risk Interventions ?   ? View : No data to display.  ?  ?  ?  ? ? ? ? ? ?

## 2021-06-15 NOTE — Progress Notes (Signed)
Physical Therapy Treatment ?Patient Details ?Name: Susan Lewis ?MRN: [id number] ?DOB: 09/19/1960 ?Today's Date: 06/15/2021 ? ? ?History of Present Illness 61 y.o. female presents to [facility] hospital on [facility]/12/2021 with R weakness and impaired communication. 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 Pt underwent revascularization of L MCA and ICA with stent assisted angioplasty on 3/28. PMH includes COPD, cataracts, HTN, HLD, depression, and PTSD. ? ?  ?PT Comments  ? ? Pt progressing well towards all goals. Pt with improved ambulation tolerance but continues to present with expressive aphasia, emotional lability, R sided weakness, and impaired balance. Continue to recommend CIR Upon d/c for maximal functional recovery. Acute PT to cont to follow. ?   ?Recommendations for follow up therapy are one component of a multi-disciplinary discharge planning process, led by the attending physician.  Recommendations may be updated based on patient status, additional functional criteria and insurance authorization. ? ?Follow Up Recommendations ? Acute inpatient rehab (3hours/day) ?  ?  ?Assistance Recommended at Discharge Intermittent Supervision/Assistance  ?Patient can return home with the following Assistance with cooking/housework;Assistance with feeding;Direct supervision/assist for financial management;Direct supervision/assist for medications management;Help with stairs or ramp for entrance;Assist for transportation;A little help with walking and/or transfers ?  ?Equipment Recommendations ? Rolling walker (2 wheels);BSC/3in1  ?  ?Recommendations for Other Services Rehab consult ? ? ?  ?Precautions / Restrictions Precautions ?Precautions: Fall ?Restrictions ?Weight Bearing Restrictions: No  ?  ? ?Mobility ? Bed Mobility ?  ?  ?  ?  ?  ?  ?  ?General bed mobility comments: pt up in chair upon PT arrival ?  ? ?Transfers ?Overall transfer level: Needs assistance ?Equipment used: None ?Transfers: Sit to/from Stand, Bed  to chair/wheelchair/BSC ?Sit to Stand: Min assist ?  ?  ?  ?  ?  ?General transfer comment: minA to power up, verbal cues for safe hand placement ?  ? ?Ambulation/Gait ?Ambulation/Gait assistance: Mod assist, Min assist ?Gait Distance (Feet): 100 Feet (x2) ?Assistive device: None, Rolling walker (2 wheels) ?Gait Pattern/deviations: Step-through pattern, Shuffle ?Gait velocity: reduced ?Gait velocity interpretation: <1.31 ft/sec, indicative of household ambulator ?  ?General Gait Details: pt trialed RW first, despite weak grip pt able to hold onto walker, pt with noted scuffing of R LE with onset of fatigue but overall more steady. verbal cues to look straight ahead and not down at her feet. Trailed no AD, pt with R LE instability and shuffling in addition to lateral swaying and near cross over gait at times ? ? ?Stairs ?  ?  ?  ?  ?  ? ? ?Wheelchair Mobility ?  ? ?Modified Rankin (Stroke Patients Only) ?Modified Rankin (Stroke Patients Only) ?Pre-Morbid Rankin Score: No symptoms ?Modified Rankin: Moderately severe disability ? ? ?  ?Balance Overall balance assessment: Needs assistance ?Sitting-balance support: Feet unsupported ?Sitting balance-Leahy Scale: Good ?Sitting balance - Comments: will lean forward to pick up items/impulsive ?Postural control: Right lateral lean ?Standing balance support: Single extremity supported ?Standing balance-Leahy Scale: Fair ?Standing balance comment: pt was able to stand at sink and wash hands without leaning on sink ?  ?  ?  ?  ?  ?  ?  ?  ?  ?  ?  ?  ? ?  ?Cognition Arousal/Alertness: Awake/alert ?Behavior During Therapy: [facility] for tasks assessed/performed ?Overall Cognitive Status: Impaired/Different from baseline ?Area of Impairment: Awareness, Problem solving, Safety/judgement ?  ?  ?  ?  ?  ?  ?  ?  ?  ?  ?  ?  ?  Safety/Judgement: Decreased awareness of safety ?Awareness: Emergent ?Problem Solving: Difficulty sequencing ?General Comments: pt emotional today. pt with significant  expressive aphasia, does better with yes/no questions due to word finding difficulties. Pt is oriented to place, situation, person. Pt follows commands well, demonstrates reduced awareness of deficits. ?  ?  ? ?  ?Exercises   ? ?  ?General Comments General comments (skin integrity, edema, etc.): vss, assisted to bathroom ?  ?  ? ?Pertinent Vitals/Pain Pain Assessment ?Pain Assessment: No/denies pain  ? ? ?Home Living   ?  ?  ?  ?  ?  ?  ?  ?  ?  ?   ?  ?Prior Function    ?  ?  ?   ? ?PT Goals (current goals can now be found in the care plan section) Acute Rehab PT Goals ?Patient Stated Goal: to return to independence ?PT Goal Formulation: With patient ?Time For Goal Achievement: 06/27/21 ?Potential to Achieve Goals: Fair ?Progress towards PT goals: Progressing toward goals ? ?  ?Frequency ? ? ? Min 4X/week ? ? ? ?  ?PT Plan Current plan remains appropriate  ? ? ?Co-evaluation   ?  ?  ?  ?  ? ?  ?AM-PAC PT "6 Clicks" Mobility   ?Outcome Measure ? Help needed turning from your back to your side while in a flat bed without using bedrails?: A Little ?Help needed moving from lying on your back to sitting on the side of a flat bed without using bedrails?: A Little ?Help needed moving to and from a bed to a chair (including a wheelchair)?: A Little ?Help needed standing up from a chair using your arms (e.g., wheelchair or bedside chair)?: A Little ?Help needed to walk in hospital room?: A Lot ?Help needed climbing 3-5 steps with a railing? : Total ?6 Click Score: 15 ? ?  ?End of Session Equipment Utilized During Treatment: Gait belt ?Activity Tolerance: Patient tolerated treatment well (nausea, dizziness, pallor) ?Patient left: in bed;with call bell/phone within reach;in chair;with chair alarm set ?Nurse Communication: Mobility status ?PT Visit Diagnosis: Other abnormalities of gait and mobility (R26.89);Muscle weakness (generalized) (M62.81);Other symptoms and signs involving the nervous system (R29.898) ?  ? ? ?Time:  1040-1100 ?PT Time Calculation (min) (ACUTE ONLY): 20 min ? ?Charges:  $Gait Training: 8-22 mins          ?          ? ?Lewis Shock, PT, DPT ?Acute Rehabilitation Services ?Secure chat preferred. ?Office #: [phone] ? ? ? ?Gesenia Bantz M Dshawn Mcnay ?06/15/2021, 12:05 PM ? ?

## 2021-06-15 NOTE — Plan of Care (Signed)
?  Problem: Safety: Goal: Non-violent Restraint(s) Outcome: Completed/Met   

## 2021-06-16 DIAGNOSIS — I63512 Cerebral infarction due to unspecified occlusion or stenosis of left middle cerebral artery: Secondary | ICD-10-CM

## 2021-06-16 DIAGNOSIS — F418 Other specified anxiety disorders: Secondary | ICD-10-CM

## 2021-06-16 NOTE — Plan of Care (Signed)
?  Problem: RH Balance ?Goal: LTG Patient will maintain dynamic sitting balance (PT) ?Description: LTG:  Patient will maintain dynamic sitting balance with assistance during mobility activities (PT) ?Flowsheets (Taken 06/16/2021 1248) ?LTG: Pt will maintain dynamic sitting balance during mobility activities with:: Independent with assistive device  ?Goal: LTG Patient will maintain dynamic standing balance (PT) ?Description: LTG:  Patient will maintain dynamic standing balance with assistance during mobility activities (PT) ?Flowsheets (Taken 06/16/2021 1248) ?LTG: Pt will maintain dynamic standing balance during mobility activities with:: Supervision/Verbal cueing ?  ?Problem: Sit to Stand ?Goal: LTG:  Patient will perform sit to stand with assistance level (PT) ?Description: LTG:  Patient will perform sit to stand with assistance level (PT) ?Flowsheets (Taken 06/16/2021 1248) ?LTG: PT will perform sit to stand in preparation for functional mobility with assistance level: Supervision/Verbal cueing ?  ?Problem: RH Bed Mobility ?Goal: LTG Patient will perform bed mobility with assist (PT) ?Description: LTG: Patient will perform bed mobility with assistance, with/without cues (PT). ?Flowsheets (Taken 06/16/2021 1248) ?LTG: Pt will perform bed mobility with assistance level of: Independent with assistive device  ?  ?Problem: RH Bed to Chair Transfers ?Goal: LTG Patient will perform bed/chair transfers w/assist (PT) ?Description: LTG: Patient will perform bed to chair transfers with assistance (PT). ?Flowsheets (Taken 06/16/2021 1248) ?LTG: Pt will perform Bed to Chair Transfers with assistance level: Supervision/Verbal cueing ?  ?Problem: RH Car Transfers ?Goal: LTG Patient will perform car transfers with assist (PT) ?Description: LTG: Patient will perform car transfers with assistance (PT). ?Flowsheets (Taken 06/16/2021 1248) ?LTG: Pt will perform car transfers with assist:: Supervision/Verbal cueing ?  ?Problem: RH  Ambulation ?Goal: LTG Patient will ambulate in controlled environment (PT) ?Description: LTG: Patient will ambulate in a controlled environment, # of feet with assistance (PT). ?Flowsheets (Taken 06/16/2021 1248) ?LTG: Pt will ambulate in controlled environ  assist needed:: Supervision/Verbal cueing ?LTG: Ambulation distance in controlled environment: >174ft using LRAD ?Goal: LTG Patient will ambulate in home environment (PT) ?Description: LTG: Patient will ambulate in home environment, # of feet with assistance (PT). ?Flowsheets (Taken 06/16/2021 1248) ?LTG: Pt will ambulate in home environ  assist needed:: Supervision/Verbal cueing ?LTG: Ambulation distance in home environment: 58ft using LRAD ?  ?Problem: RH Stairs ?Goal: LTG Patient will ambulate up and down stairs w/assist (PT) ?Description: LTG: Patient will ambulate up and down # of stairs with assistance (PT) ?Flowsheets (Taken 06/16/2021 1248) ?LTG: Pt will ambulate up/down stairs assist needed:: Supervision/Verbal cueing ?LTG: Pt will  ambulate up and down number of stairs: 4 steps using R HR ?  ?

## 2021-06-16 NOTE — Plan of Care (Signed)
?  Problem: RH Comprehension Communication ?Goal: LTG Patient will comprehend basic/complex auditory (SLP) ?Description: LTG: Patient will comprehend basic/complex auditory information with cues (SLP). ?Flowsheets (Taken 06/16/2021 1601) ?LTG: Patient will comprehend: (2-3 step commands) Complex auditory information ?  ?Problem: RH Expression Communication ?Goal: LTG Patient will verbally express basic/complex needs(SLP) ?Description: LTG:  Patient will verbally express basic/complex needs, wants or ideas with cues  (SLP) ?Flowsheets (Taken 06/16/2021 1601) ?LTG: Patient will verbally express basic/complex needs, wants or ideas (SLP): (basic-mildly complex) Supervision ?Goal: LTG Patient will increase word finding of common (SLP) ?Description: LTG:  Patient will increase word finding of common objects/daily info/abstract thoughts with cues using compensatory strategies (SLP). ?Flowsheets (Taken 06/16/2021 1601) ?LTG: Patient will increase word finding of common (SLP): Minimal Assistance - Patient > 75% ?Patient will use compensatory strategies to increase word finding of: ? Common objects ? Daily info ?  ?Problem: RH Awareness ?Goal: LTG: Patient will demonstrate awareness during functional activites type of (SLP) ?Description: LTG: Patient will demonstrate awareness during functional activites type of (SLP) ?Flowsheets (Taken 06/16/2021 1601) ?Patient will demonstrate during cognitive/linguistic activities awareness type of: Emergent ?LTG: Patient will demonstrate awareness during cognitive/linguistic activities with assistance of (SLP): Supervision ?  ?

## 2021-06-16 NOTE — Evaluation (Signed)
Occupational Therapy Assessment and Plan ? ?Patient Details  ?Name: Susan Lewis ?MRN: [id number] ?Date of Birth: 03/26/1960 ? ?OT Diagnosis: apraxia and muscle weakness (generalized) ?Rehab Potential: Rehab Potential (ACUTE ONLY): Good ?ELOS: 10-12 days  ? ?Today's Date: 06/16/2021 ?OT Individual Time: 0845-1000 ?OT Individual Time Calculation (min): 75 min    ? ?Hospital Problem: Principal Problem: ?  Left middle cerebral artery stroke (HCC) ?Active Problems: ?  Depression with anxiety ? ? ?Past Medical History:  ?Past Medical History:  ?Diagnosis Date  ? Anxiety   ? Arthritis   ? Bursitis   ? Carpal tunnel syndrome   ? COPD (chronic obstructive pulmonary disease) (HCC)   ? Depression   ? Fibromyalgia   ? Fibromyalgia   ? GERD (gastroesophageal reflux disease)   ? Hemorrhoids 10/27/2014  ? Hyperlipidemia   ? Hypertension   ? Kidney stones   ? ?Past Surgical History:  ?Past Surgical History:  ?Procedure Laterality Date  ? CARPAL TUNNEL RELEASE Right   ? carpel tunnel   ? CESAREAN SECTION    ? CHOLECYSTECTOMY    ? ENDOMETRIAL ABLATION    ? EXCISIONAL HEMORRHOIDECTOMY    ? IR ANGIO EXTRACRAN SEL COM CAROTID INNOMINATE UNI R MOD SED  06/12/2021  ? IR CT HEAD LTD  06/12/2021  ? IR CT HEAD LTD  06/12/2021  ? IR INTRAVSC STENT CERV CAROTID W/O EMB-PROT MOD SED INC ANGIO  06/12/2021  ? IR PERCUTANEOUS ART THROMBECTOMY/INFUSION INTRACRANIAL INC DIAG ANGIO  06/12/2021  ? OOPHORECTOMY Left   ? ORIF ANKLE FRACTURE BIMALLEOLAR    ? RADIOLOGY WITH ANESTHESIA N/A 06/12/2021  ? Procedure: IR WITH ANESTHESIA;  Surgeon: Radiologist, Medication, MD;  Location: [facility] OR;  Service: Radiology;  Laterality: N/A;  ? ? ?Assessment & Plan ?Clinical Impression: Patient is a 61 year old right-handed female with history of COPD/tobacco use, PTSD, hyperlipidemia, hypertension.  Per chart review patient lives with spouse.  Independent prior to admission.  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  Presented 06/12/2021 with aphasia and  right-sided weakness.  Reportedly she fell trying to get out of bed.  Family denied loss of consciousness.  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  No hemorrhage or mass effect.  CT angiogram head and neck positive for tandem emergent large vessel occlusions.  Left ICA in the neck and posterior M2 left MCA branch at the bifurcation.  CTP detects a 22 mL left MCA infarct core with 60 mL penumbra.  Admission chemistries unremarkable except creatinine 1.02.  Patient underwent mechanical thrombectomy with TICI3 revascularization per interventional radiology.  Follow-up MRI showed scattered diffusion restriction in the MCA territory.  No evidence of hemorrhagic transformation.  No significant mass effect.  Echocardiogram with ejection fraction of 70% no wall motion abnormalities grade 1 diastolic dysfunction.  Currently maintained on aspirin 81 mg daily and Brilinta 90 mg twice daily for 6 months then aspirin alone.  She was cleared to begin Lovenox for DVT prophylaxis.  Tolerating a regular diet.  Therapy evaluations completed due to patient's right side weakness and aphasia was admitted for a comprehensive rehab program.  Patient transferred to CIR on 06/15/2021 .   ? ?Patient currently requires min with basic self-care skills secondary to muscle weakness, decreased cardiorespiratoy endurance, motor apraxia, decreased coordination, and decreased motor planning, and decreased standing balance, decreased postural control, and decreased balance strategies.  Prior to  hospitalization, patient could complete BADLs with independent . ? ?Patient will benefit from skilled intervention to increase independence with basic self-care skills prior to discharge home with care partner.  Anticipate patient will require intermittent supervision and no further OT follow recommended. ? ?OT - End of Session ?Activity Tolerance: Decreased this  session ?Endurance Deficit: Yes ?OT Assessment ?Rehab Potential (ACUTE ONLY): Good ?OT Patient demonstrates impairments in the following area(s): Balance;Safety;Sensory;Cognition;Endurance;Motor;Perception ?OT Basic ADL's Functional Problem(s): Grooming;Bathing;Dressing;Toileting;Eating ?OT Transfers Functional Problem(s): Toilet;Tub/Shower ?OT Additional Impairment(s): None ?OT Plan ?OT Intensity: Minimum of 1-2 x/day, 45 to 90 minutes ?OT Frequency: 5 out of 7 days ?OT Duration/Estimated Length of Stay: 10-12 days ?OT Treatment/Interventions: Balance/vestibular training;Disease mangement/prevention;Neuromuscular re-education;Self Care/advanced ADL retraining;Therapeutic Exercise;Wheelchair propulsion/positioning;Cognitive remediation/compensation;DME/adaptive equipment instruction;Pain management;Skin care/wound managment;UE/LE Strength taining/ROM;Community reintegration;Patient/family education;Functional electrical stimulation;Splinting/orthotics;UE/LE Coordination activities;Discharge planning;Functional mobility training;Psychosocial support;Visual/perceptual remediation/compensation;Therapeutic Activities ?OT Self Feeding Anticipated Outcome(s): Mod I ?OT Basic Self-Care Anticipated Outcome(s): Mod I ?OT Toileting Anticipated Outcome(s): Mod I ?OT Bathroom Transfers Anticipated Outcome(s): Mod I toilet transfer, supervision tub transfer ?OT Recommendation ?Patient destination: Home ?Follow Up Recommendations: None ?Equipment Recommended: To be determined ? ? ?OT Evaluation ?Precautions/Restrictions  ?Precautions ?Precautions: Fall ?General ?Chart Reviewed: Yes ?Vital Signs ?  ?Pain ?Pain Assessment ?Pain Scale: 0-10 ?Pain Score: 0-No pain ?Pain Location: Pelvis ?Pain Intervention(s): Medication (See eMAR) ?Home Living/Prior Functioning ?Home Living ?Family/patient expects to be discharged to:: Private residence ?Living Arrangements: Spouse/significant other, Children ?Available Help at Discharge: Family,  Available 24 hours/day ?Type of Home: House ?Home Access: Stairs to enter ?Entrance Stairs-Number of Steps: 4 ?Entrance Stairs-Rails:  (unsure) ?Home Layout: One level ?Bathroom Shower/Tub: Tub/shower unit ?Bathroom Toilet: Standard ?Bathroom Accessibility: Yes ? Lives With: Spouse, Son ?Vision ?Baseline Vision/History: 1 Wears glasses ?Ability to See in Adequate Light: 0 Adequate ?Patient Visual Report: No change from baseline ?Vision Assessment?: Yes;No apparent visual deficits ?Perception  ?Perception: Within Functional Limits ?Praxis ?Praxis: Impaired ?Praxis Impairment Details: Motor planning ?Praxis-Other Comments: RUE ?Cognition ?Cognition ?Overall Cognitive Status: Within Functional Limits for tasks assessed ?Arousal/Alertness: Awake/alert ?Orientation Level: Person;Place;Situation ?Person: Oriented ?Place: Oriented ?Situation: Oriented ?Memory: Appears intact ?Attention: Sustained ?Focused Attention: Appears intact ?Sustained Attention: Appears intact ?Awareness: Impaired ?Awareness Impairment: Emergent impairment;Anticipatory impairment ?Problem Solving: Impaired ?Problem Solving Impairment: Verbal complex;Functional complex ?Safety/Judgment: Appears intact ?Brief Interview for Mental Status (BIMS) ?Repetition of Three Words (First Attempt): 3 ?Temporal Orientation: Year: Correct ?Temporal Orientation: Month: Accurate within 5 days ?Temporal Orientation: Day: Incorrect ?Recall: "Sock": Yes, no cue required ?Recall: "Blue": Yes, after cueing ("a color") ?Recall: "Bed": Yes, after cueing ("a piece of furniture") ?BIMS Summary Score: 12 ?Sensation ?Sensation ?Light Touch: Appears Intact ?Coordination ?Gross Motor Movements are Fluid and Coordinated: No ?Fine Motor Movements are Fluid and Coordinated: No ?Coordination and Movement Description: 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

## 2021-06-16 NOTE — Evaluation (Signed)
Physical Therapy Assessment and Plan ? ?Patient Details  ?Name: Susan Lewis ?MRN: [id number] ?Date of Birth: 11-30-1960 ? ?PT Diagnosis: Abnormality of gait, Cognitive deficits, Coordination disorder, Difficulty walking, and Muscle weakness ?Rehab Potential: Excellent ?ELOS: ~7-10 days  ? ?Today's Date: 06/16/2021 ?PT Individual Time: 9767-3419 ?PT Individual Time Calculation (min): 66 min   ? ?Hospital Problem: Principal Problem: ?  Left middle cerebral artery stroke (HCC) ?Active Problems: ?  Depression with anxiety ? ? ?Past Medical History:  ?Past Medical History:  ?Diagnosis Date  ? Anxiety   ? Arthritis   ? Bursitis   ? Carpal tunnel syndrome   ? COPD (chronic obstructive pulmonary disease) (HCC)   ? Depression   ? Fibromyalgia   ? Fibromyalgia   ? GERD (gastroesophageal reflux disease)   ? Hemorrhoids 10/27/2014  ? Hyperlipidemia   ? Hypertension   ? Kidney stones   ? ?Past Surgical History:  ?Past Surgical History:  ?Procedure Laterality Date  ? CARPAL TUNNEL RELEASE Right   ? carpel tunnel   ? CESAREAN SECTION    ? CHOLECYSTECTOMY    ? ENDOMETRIAL ABLATION    ? EXCISIONAL HEMORRHOIDECTOMY    ? IR ANGIO EXTRACRAN SEL COM CAROTID INNOMINATE UNI R MOD SED  06/12/2021  ? IR CT HEAD LTD  06/12/2021  ? IR CT HEAD LTD  06/12/2021  ? IR INTRAVSC STENT CERV CAROTID W/O EMB-PROT MOD SED INC ANGIO  06/12/2021  ? IR PERCUTANEOUS ART THROMBECTOMY/INFUSION INTRACRANIAL INC DIAG ANGIO  06/12/2021  ? OOPHORECTOMY Left   ? ORIF ANKLE FRACTURE BIMALLEOLAR    ? RADIOLOGY WITH ANESTHESIA N/A 06/12/2021  ? Procedure: IR WITH ANESTHESIA;  Surgeon: Radiologist, Medication, MD;  Location: [facility] OR;  Service: Radiology;  Laterality: N/A;  ? ? ?Assessment & Plan ?Clinical Impression: Patient is a 61 y.o. year old  right-handed female with history of COPD/tobacco use, PTSD, hyperlipidemia, hypertension.  Per chart review patient lives with spouse.  Independent prior to admission.  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  Presented  06/12/2021 with aphasia and right-sided weakness.  Reportedly she fell trying to get out of bed.  Family denied loss of consciousness.  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  No hemorrhage or mass effect.  CT angiogram head and neck positive for tandem emergent large vessel occlusions.  Left ICA in the neck and posterior M2 left MCA branch at the bifurcation.  CTP detects a 22 mL left MCA infarct core with 60 mL penumbra.  Admission chemistries unremarkable except creatinine 1.02.  Patient underwent mechanical thrombectomy with TICI3 revascularization per interventional radiology.  Follow-up MRI showed scattered diffusion restriction in the MCA territory.  No evidence of hemorrhagic transformation.  No significant mass effect.  Echocardiogram with ejection fraction of 70% no wall motion abnormalities grade 1 diastolic dysfunction.  Currently maintained on aspirin 81 mg daily and Brilinta 90 mg twice daily for 6 months then aspirin alone.  She was cleared to begin Lovenox for DVT prophylaxis.  Tolerating a regular diet.  Therapy evaluations completed due to patient's right side weakness and aphasia was admitted for a comprehensive rehab program. Patient transferred to CIR on 06/15/2021 .  ? ?Patient currently requires min assist with mobility secondary to muscle weakness, decreased cardiorespiratoy endurance, motor apraxia and decreased motor planning,  , and decreased standing balance, decreased postural control, and decreased balance strategies.  Prior to hospitalization, patient was independent  with mobility and lived with Spouse, Son (husband, Loraine Leriche, & 2 adult sons) in a House home.  Home access is  Stairs to enter, Level entry (level entry to enter home but have to go up 4 steps to get to kitchen and bathroom). ? ?Patient will benefit from skilled PT intervention to maximize safe functional mobility, minimize fall risk,  and decrease caregiver burden for planned discharge home with 24 hour supervision.  Anticipate patient will benefit from follow up OP at discharge. ? ?PT - End of Session ?Activity Tolerance: Tolerates 30+ min activity with multiple rests ?Endurance Deficit: Yes ?Endurance Deficit Description: requires intermittent seated rest breaks due to fatigue ?PT Assessment ?Rehab Potential (ACUTE/IP ONLY): Excellent ?PT Barriers to Discharge: Home environment access/layout ?PT Patient demonstrates impairments in the following area(s): Balance;Skin Integrity;Endurance;Motor;Nutrition;Pain;Perception;Behavior;Safety ?PT Transfers Functional Problem(s): Bed Mobility;Bed to Chair;Car;Furniture;Floor ?PT Locomotion Functional Problem(s): Ambulation;Stairs ?PT Plan ?PT Intensity: Minimum of 1-2 x/day ,45 to 90 minutes ?PT Frequency: 5 out of 7 days ?PT Duration Estimated Length of Stay: ~7-10 days ?PT Treatment/Interventions: Ambulation/gait training;Community reintegration;DME/adaptive equipment instruction;Neuromuscular re-education;Psychosocial support;Stair training;UE/LE Strength taining/ROM;Balance/vestibular training;Discharge planning;Functional electrical stimulation;Pain management;Skin care/wound management;Therapeutic Activities;UE/LE Coordination activities;Cognitive remediation/compensation;Disease management/prevention;Functional mobility training;Patient/family education;Therapeutic Exercise;Visual/perceptual remediation/compensation;Splinting/orthotics ?PT Transfers Anticipated Outcome(s): supervision using LRAD ?PT Locomotion Anticipated Outcome(s): supervision using LRAD ?PT Recommendation ?Recommendations for Other Services: Therapeutic Recreation consult ?Therapeutic Recreation Interventions: Outing/community reintergration ?Follow Up Recommendations: Outpatient PT;24 hour supervision/assistance ?Patient destination: Home ?Equipment Recommended: To be determined ? ? ?PT  Evaluation ?Precautions/Restrictions ?Precautions ?Precautions: Fall;Other (comment) ?Precaution Comments: aphasia, R hemiparesis ?Restrictions ?Weight Bearing Restrictions: No ?Pain ?Pain Assessment ?Pain Scale: 0-10 ?Pain Score: 0-No pain ?Pain Location: Pelvis ?Pain Intervention(s): Medication (See eMAR) ?Pain Interference ?Pain Interference ?Pain Effect on Sleep: 1. Rarely or not at all ?Pain Interference with Therapy Activities: 1. Rarely or not at all ?Pain Interference with Day-to-Day Activities: 3. Frequently ?Home Living/Prior Functioning ?Home Living ?Available Help at Discharge: Family;Available 24 hours/day ?Type of Home: House ?Home Access: Stairs to enter;Level entry (level entry to enter home but have to go up 4 steps to get to kitchen and bathroom) ?Entrance Stairs-Number of Steps: 4 ?Entrance Stairs-Rails: Right (not very sturdy) ?Home Layout: One level ?Bathroom Shower/Tub: Tub/shower unit ?Bathroom Toilet: Standard ?Bathroom Accessibility: Yes ? Lives With: Spouse;Son (husband, Loraine Leriche, & 2 adult sons) ?Prior Function ?Level of Independence: Independent with gait;Independent with transfers;Independent with homemaking with ambulation ? Able to Take Stairs?: Yes ?Driving: Yes ?Vocation: Part time employment ?Vocation Requirements: Conservation officer, nature at [employer] ?Vision/Perception  ?Vision - History ?Ability to See in Adequate Light: 0 Adequate ?Perception ?Perception: Within Functional Limits ?Praxis ?Praxis: Impaired ?Praxis Impairment Details: Motor planning ?Praxis-Other Comments: R UE  ?Cognition ?Overall Cognitive Status: Within Functional Limits for tasks assessed ?Arousal/Alertness: Awake/alert ?Orientation Level: Oriented X4 (pt with aphasia requiring incresed time to answer questions) ?Year: 2023 ?Month: April ?Day of Week: Correct ?Attention: Sustained ?Focused Attention: Appears intact ?Sustained Attention: Appears intact ?Memory: Appears intact ?Awareness: Impaired ?Awareness Impairment: Anticipatory  impairment;Emergent impairment ?Problem Solving: Impaired ?Problem Solving Impairment: Verbal complex;Functional complex ?Safety/Judgment: Appears intact  ?Sensation ?Sensation ?Light Touch: Appears Intact ?Hot/Cold: Not tested ?[employer]

## 2021-06-16 NOTE — Progress Notes (Addendum)
?                                                       PROGRESS NOTE ? ? ?Subjective/Complaints: ?No issues overnite ? ?ROS- neg CP, SOB, N/V/D ? ? ?Objective: ?  ?No results found. ?Recent Labs  ?  06/14/21 ?1610  ?WBC 7.0  ?HGB 12.0  ?HCT 35.4*  ?PLT 169  ? ?Recent Labs  ?  06/14/21 ?9604  ?NA 140  ?K 3.4*  ?CL 107  ?CO2 25  ?GLUCOSE 90  ?BUN 12  ?CREATININE 0.94  ?CALCIUM 8.9  ? ? ?Intake/Output Summary (Last 24 hours) at 06/16/2021 0905 ?Last data filed at 06/15/2021 2134 ?Gross per 24 hour  ?Intake 640 ml  ?Output --  ?Net 640 ml  ?  ? ?  ? ?Physical Exam: ?Vital Signs ?Blood pressure 129/75, pulse 74, temperature 98.2 ?F (36.8 ?C), resp. rate 14, height 5\' 5"  (1.651 m), weight 57.5 kg, SpO2 98 %. ? ? ?General: No acute distress ?Mood and affect are appropriate ?Heart: Regular rate and rhythm no rubs murmurs or extra sounds ?Lungs: Clear to auscultation, breathing unlabored, no rales or wheezes ?Abdomen: Positive bowel sounds, soft nontender to palpation, nondistended ?Extremities: No clubbing, cyanosis, or edema ?Skin: ecchymosis infra inguinal, pubic an dmedial thigh on right mildly tender no, no thrill right fem art ?Neurologic: Cranial nerves II through XII intact, motor strength is 5/5 in Left and 4/5 right  deltoid, bicep, tricep, grip, hip flexor, knee extensors, ankle dorsiflexor and plantar flexor ?Sensory exam normal sensation to light touch and proprioception in bilateral upper extremities ?Cerebellar exam normal finger to nose to finger  left upper , moderate dysmetria , RIght upper ext ?Musculoskeletal: Full range of motion in all 4 extremities. No joint swelling ? ? ?Assessment/Plan: ?1. Functional deficits which require 3+ hours per day of interdisciplinary therapy in a comprehensive inpatient rehab setting. ?Physiatrist is providing close team supervision and 24 hour management of active medical problems listed below. ?Physiatrist and rehab team continue to assess barriers to discharge/monitor  patient progress toward functional and medical goals ? ?Care Tool: ? ?Bathing ?   ?   ?   ?  ?  ?Bathing assist   ?  ?  ?Upper Body Dressing/Undressing ?Upper body dressing   ?  ?   ?Upper body assist Assist Level: Maximal Assistance - Patient 25 - 49% ?   ?Lower Body Dressing/Undressing ?Lower body dressing ? ? ? Lower body dressing activity did not occur: Safety/medical concerns ?  ? ?  ? ?Lower body assist   ?   ? ?Toileting ?Toileting    ?Toileting assist   ?  ?  ?Transfers ?Chair/bed transfer ? ?Transfers assist ? Chair/bed transfer activity did not occur: Safety/medical concerns ? ?  ?  ?  ?Locomotion ?Ambulation ? ? ?Ambulation assist ? ?   ? ?  ?  ?   ? ?Walk 10 feet activity ? ? ?Assist ?   ? ?  ?   ? ?Walk 50 feet activity ? ? ?Assist   ? ?  ?   ? ? ?Walk 150 feet activity ? ? ?Assist   ? ?  ?  ?  ? ?Walk 10 feet on uneven surface  ?activity ? ? ?Assist   ? ? ?  ?   ? ?  Wheelchair ? ? ? ? ?Assist   ?  ?  ? ?  ?   ? ? ?Wheelchair 50 feet with 2 turns activity ? ? ? ?Assist ? ?  ?  ? ? ?   ? ?Wheelchair 150 feet activity  ? ? ? ?Assist ?   ? ? ?   ? ?Blood pressure 129/75, pulse 74, temperature 98.2 ?F (36.8 ?C), resp. rate 14, height 5\' 5"  (1.651 m), weight 57.5 kg, SpO2 98 %. ? ?Medical Problem List and Plan: ?1. 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            -patient may shower ?            -ELOS/Goals: 10-12 days, mod I to supervision with PT, OT, SLP ?2.  Antithrombotics: ?-DVT/anticoagulation:  Pharmaceutical: Lovenox ?            -antiplatelet therapy: Aspirin 81 mg daily and Brilinta 90 mg twice daily x6 months then aspirin alone ?3. Pain Management: Tylenol as needed ?4. Mood: BuSpar 15 mg twice daily, Zoloft 150 mg daily, trazodone 150 mg nightly Atarax as needed anxiety ?            -antipsychotic agents: N/A ?5. Neuropsych: This patient is  capable of making decisions on her own behalf. ?6. Skin/Wound Care: Routine skin checks, extensive Right groin , pubic and medial thigh bruising at femoral sheath insertion site , hgb wnl ?7. Fluids/Electrolytes/Nutrition: Routine in and outs with follow-up chemistries ?8.  Hyperlipidemia.  Lipitor ?9.  Hypertension.  Norvasc 5 mg daily.  Monitor with increased mobility ?Vitals:  ? 06/15/21 2005 06/16/21 0510  ?BP: 137/72 129/75  ?Pulse: 65 74  ?Resp: 14 14  ?Temp: 98.6 ?F (37 ?C) 98.2 ?F (36.8 ?C)  ?SpO2: 99% 98%  ?  ?10.  History of tobacco as well as marijuana use.  Provide counseling ?11.  GERD.  Prilosec ?  ? ?LOS: ?1 days ?A FACE TO FACE EVALUATION WAS PERFORMED ? ?08/16/21 Shawnelle Spoerl ?06/16/2021, 9:05 AM  ? ? ? ?

## 2021-06-16 NOTE — Evaluation (Signed)
Speech Language Pathology Assessment and Plan ? ?Patient Details  ?Name: Susan Lewis ?MRN: [id number] ?Date of Birth: 05-17-1960 ? ?SLP Diagnosis: Speech and Language deficits  ?Rehab Potential: Good ?ELOS: 7-10 days  ? ? ?Today's Date: 06/16/2021 ?SLP Individual Time: 1302-1400 ?SLP Individual Time Calculation (min): 58 min ? ? ?Hospital Problem: Principal Problem: ?  Left middle cerebral artery stroke (HCC) ?Active Problems: ?  Depression with anxiety ? ?Past Medical History:  ?Past Medical History:  ?Diagnosis Date  ? Anxiety   ? Arthritis   ? Bursitis   ? Carpal tunnel syndrome   ? COPD (chronic obstructive pulmonary disease) (HCC)   ? Depression   ? Fibromyalgia   ? Fibromyalgia   ? GERD (gastroesophageal reflux disease)   ? Hemorrhoids 10/27/2014  ? Hyperlipidemia   ? Hypertension   ? Kidney stones   ? ?Past Surgical History:  ?Past Surgical History:  ?Procedure Laterality Date  ? CARPAL TUNNEL RELEASE Right   ? carpel tunnel   ? CESAREAN SECTION    ? CHOLECYSTECTOMY    ? ENDOMETRIAL ABLATION    ? EXCISIONAL HEMORRHOIDECTOMY    ? IR ANGIO EXTRACRAN SEL COM CAROTID INNOMINATE UNI R MOD SED  06/12/2021  ? IR CT HEAD LTD  06/12/2021  ? IR CT HEAD LTD  06/12/2021  ? IR INTRAVSC STENT CERV CAROTID W/O EMB-PROT MOD SED INC ANGIO  06/12/2021  ? IR PERCUTANEOUS ART THROMBECTOMY/INFUSION INTRACRANIAL INC DIAG ANGIO  06/12/2021  ? OOPHORECTOMY Left   ? ORIF ANKLE FRACTURE BIMALLEOLAR    ? RADIOLOGY WITH ANESTHESIA N/A 06/12/2021  ? Procedure: IR WITH ANESTHESIA;  Surgeon: Radiologist, Medication, MD;  Location: [facility] OR;  Service: Radiology;  Laterality: N/A;  ? ? ?Assessment / Plan / Recommendation ?Clinical Impression Susan Lewis is a 61 year old right-handed female with history of COPD/tobacco use, PTSD, hyperlipidemia, hypertension.  Per chart review patient lives with spouse.  Independent prior to admission.  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  Presented 06/12/2021 with aphasia and right-sided weakness.   Reportedly she fell trying to get out of bed.  Family denied loss of consciousness.  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  No hemorrhage or mass effect.  CT angiogram head and neck positive for tandem emergent large vessel occlusions.  Left ICA in the neck and posterior M2 left MCA branch at the bifurcation.  CTP detects a 22 mL left MCA infarct core with 60 mL penumbra.  Admission chemistries unremarkable except creatinine 1.02.  Patient underwent mechanical thrombectomy with TICI3 revascularization per interventional radiology.  Follow-up MRI showed scattered diffusion restriction in the MCA territory.  No evidence of hemorrhagic transformation.  No significant mass effect.  Echocardiogram with ejection fraction of 70% no wall motion abnormalities grade 1 diastolic dysfunction.  Currently maintained on aspirin 81 mg daily and Brilinta 90 mg twice daily for 6 months then aspirin alone.  She was cleared to begin Lovenox for DVT prophylaxis.  Tolerating a regular diet.  Therapy evaluations completed due to patient's right side weakness and aphasia was admitted for a comprehensive rehab program. ? ?Pt presents with moderate apraxia and aphasia impacting communication at the sentence level and following complex commands. SLP utilized WABB to assess expressive and receptive language skills. Pt demonstrates halting and groping speech patterns at the sentence level, with ability to name objects with 80% accuracy, describe photos at  sentence level with 60% accuracy, and write first/last name. Pt demonstrated awareness of most verbal and written errors with min A cues and responded well to extra time, semantic, sentence completion and phonetic cues during word finding difficulties. Pt demonstrated ability to respond to complex yes/no questions with 80% accuracy, follow 1 step commands with 100% accuracy, 2 step commands with 80%  accuracy and repeat at word level. Pt demonstrated ability to read at phrase/sentence level but comprehension was not assessed. Due to times restraints and language deficits SLP was unable to administer formal cognitive assessments, pt supports acute changes in memory, higher level problem solving and emergent awareness. SLP will focus on language goals due to short ELOS and continue cognitive assessment. Pt denies acute deficits in speech nor swallow function.  Pt would benefit from skilled ST services in order to maximize functional independence and reduce burden of care, likely requiring supervision at discharge with continued skilled ST services. ? ?  ?Skilled Therapeutic Interventions          Skilled ST services focused on language skills. SLP facilitated administration of linguistic formal assessment and provided education of results. SLP and pt collaborated to set goals for cognitive linguistic needs during length of stay. All questions answered to satisfaction.  Pt was left in room with call bell within reach and bed alarm set. SLP recommends to continue skilled services.  ?  ?SLP Assessment ? Patient will need skilled Speech Lanaguage Pathology Services during CIR admission  ?  ?Recommendations ? Patient destination: Home ?Follow up Recommendations: Outpatient SLP;24 hour supervision/assistance ?Equipment Recommended: None recommended by SLP  ?  ?SLP Frequency 3 to 5 out of 7 days   ?SLP Duration ? ?SLP Intensity ? ?SLP Treatment/Interventions 7-10 days ? ?Minumum of 1-2 x/day, 30 to 90 minutes ? ?Cognitive remediation/compensation;Cueing hierarchy;Functional tasks;Patient/family education;Internal/external aids;Speech/Language facilitation   ? ?Pain ?Pain Assessment ?Pain Score: 0-No pain ? ?Prior Functioning ?Cognitive/Linguistic Baseline: Within functional limits ?Type of Home: House ? Lives With: Spouse;Son ?Available Help at Discharge: Family;Available 24 hours/day ?Vocation: Part time  employment ? ?SLP Evaluation ?Cognition ?Overall Cognitive Status: Difficult to assess (due to language deficits, will continue assessment) ?Arousal/Alertness: Awake/alert ?Orientation Level: Oriented X4 ?Attention: Selective ?Selective Attention: Appears intact ?Memory:  (unable to assess due to language) ?Awareness: Impaired ?Awareness Impairment: Emergent impairment;Anticipatory impairment ?Problem Solving:  (will continue assesment) ?Safety/Judgment: Appears intact  ?Comprehension ?Auditory Comprehension ?Overall Auditory Comprehension: Impaired ?Yes/No Questions: Impaired ?Basic Biographical Questions: 76-100% accurate ?Complex Questions: 75-100% accurate (80%) ?Commands: Impaired ?One Step Basic Commands: 75-100% accurate ?Two Step Basic Commands: 75-100% accurate ?Multistep Basic Commands: 50-74% accurate ?Conversation: Complex ?Interfering Components: Processing speed ?EffectiveTechniques: Extra processing time;Increased volume;Repetition ?Visual Recognition/Discrimination ?Discrimination: Within Function Limits ?Reading Comprehension ?Reading Status: Not tested (reading at phrase level accurate) ?Expression ?Expression ?Primary Mode of Expression: Verbal ?Verbal Expression ?Overall Verbal Expression: Impaired ?Initiation: Impaired ?Level of Generative/Spontaneous Verbalization: Sentence ?Repetition: Impaired ?Level of Impairment: Phrase level;Sentence level ?Naming: Impairment ?Confrontation: Impaired ?Verbal Errors: Aware of errors;Not aware of errors;Semantic paraphasias ?Pragmatics: No impairment ?Effective Techniques: Semantic cues;Sentence completion ?Written Expression ?Dominant Hand: Right ?Written Expression: Exceptions to [facility] ?Self Formulation Ability: Word (name and numbers) ?Oral Motor ?Oral Motor/Sensory Function ?Overall Oral Motor/Sensory Function: Within functional limits ?Motor Speech ?Overall Motor Speech: Impaired ?Respiration: Within functional limits ?Phonation: Normal ?Resonance: Within  functional limits ?Articulation: Within functional limitis ?Intelligibility: Intelligibility reduced ?Word: 75-100% accurate ?Phrase: 75-100% accurate ?Sentence: 50-74% accurate ?Motor Planning: Impaired ?Level of I

## 2021-06-17 MED ORDER — FAMOTIDINE 20 MG PO TABS
10.0000 mg | ORAL_TABLET | Freq: Two times a day (BID) | ORAL | Status: DC
  Administered 2021-06-17 – 2021-06-22 (×11): 10 mg via ORAL
  Filled 2021-06-17 (×11): qty 1

## 2021-06-17 MED ORDER — CALCIUM CARBONATE ANTACID 500 MG PO CHEW
200.0000 mg | CHEWABLE_TABLET | Freq: Three times a day (TID) | ORAL | Status: DC
  Administered 2021-06-17 – 2021-06-22 (×15): 200 mg via ORAL
  Filled 2021-06-17 (×15): qty 1

## 2021-06-17 NOTE — Progress Notes (Signed)
Spouse Mark updated on 4/3 rehab schedule. ?

## 2021-06-17 NOTE — Plan of Care (Signed)
?  Problem: Consults ?Goal: RH STROKE PATIENT EDUCATION ?Description: See Patient Education module for education specifics  ?Outcome: Progressing ?  ?Problem: RH BLADDER ELIMINATION ?Goal: RH STG MANAGE BLADDER WITH ASSISTANCE ?Description: STG Manage Bladder With mod I Assistance ?Outcome: Progressing ?Goal: RH STG MANAGE BLADDER WITH MEDICATION WITH ASSISTANCE ?Description: STG Manage Bladder With Medication With mod I  Assistance. ?Outcome: Progressing ?  ?Problem: RH SAFETY ?Goal: RH STG ADHERE TO SAFETY PRECAUTIONS W/ASSISTANCE/DEVICE ?Description: STG Adhere to Safety Precautions With cues Assistance/Device. ?Outcome: Progressing ?  ?Problem: RH PAIN MANAGEMENT ?Goal: RH STG PAIN MANAGED AT OR BELOW PT'S PAIN GOAL ?Description: At or below level 4 w prns ?Outcome: Progressing ?  ?Problem: RH KNOWLEDGE DEFICIT ?Goal: RH STG INCREASE KNOWLEDGE OF HYPERTENSION ?Description: Patient and spouse will be able to manage HTN with medications and dietary modifications using handouts and educational resources independently ?Outcome: Progressing ?Goal: RH STG INCREASE KNOWLEGDE OF HYPERLIPIDEMIA ?Outcome: Progressing ?Goal: RH STG INCREASE KNOWLEDGE OF STROKE PROPHYLAXIS ?Outcome: Progressing ?  ?

## 2021-06-17 NOTE — Discharge Instructions (Addendum)
Inpatient Rehab Discharge Instructions ? ?Susan Lewis ?Discharge date and time: No discharge date for patient encounter.  ? ?Activities/Precautions/ Functional Status: ?Activity: As tolerated ?Diet: Regular ?Wound Care: Routine skin checks ?Functional status:  ?___ No restrictions     ___ Walk up steps independently ?___ 24/7 supervision/assistance   ___ Walk up steps with assistance ?___ Intermittent supervision/assistance  ___ Bathe/dress independently ?___ Walk with walker     __x_ Bathe/dress with assistance ?___ Walk Independently    ___ Shower independently ?___ Walk with assistance    ___ Shower with assistance ?___ No alcohol     ___ Return to work/school ________ ? ?COMMUNITY REFERRALS UPON DISCHARGE:   ? ? ?Outpatient: PT     OT    ST               Agency: [facility]  Phone:[phone]  ?            Appointment Date/Time: TBD ? ?Medical Equipment/Items Ordered: Tub Transfer Bench  ?                                                Agency/Supplier: [id number] [phone] ? ? ?Special Instructions: ?No driving smoking or alcohol ? ?Plan for aspirin 81 mg daily and Brilinta 90 mg twice daily x6 months then aspirin alone ? ? ?My questions have been answered and I understand these instructions. I will adhere to these goals and the provided educational materials after my discharge from the hospital. ? ?Patient/Caregiver Signature _______________________________ Date __________ ? ?Clinician Signature _______________________________________ Date __________ ? ?Please bring this form and your medication list with you to all your follow-up doctor's appointments.  STROKE/TIA DISCHARGE INSTRUCTIONS ?SMOKING Cigarette smoking nearly doubles your risk of having a stroke & is the single most alterable risk factor  ?If you smoke or have smoked in the last 12 months, you are advised to quit smoking for your health. Most of the excess cardiovascular risk related to smoking disappears within a year of stopping. ?Ask you  doctor about anti-smoking medications ? Quit Line: 1-800-QUIT NOW ?Free Smoking Cessation Classes [phone]  ?CHOLESTEROL Know your levels; limit fat & cholesterol in your diet  ?Lipid Panel  ?   ?Component Value Date/Time  ? CHOL 111 06/13/2021 0217  ? TRIG 145 06/13/2021 0217  ? TRIG 136 06/13/2021 0217  ? HDL 31 (L) 06/13/2021 0217  ? CHOLHDL 3.6 06/13/2021 0217  ? VLDL 29 06/13/2021 0217  ? LDLCALC 51 06/13/2021 0217  ? LDLCALC 77 06/28/2020 1616  ? ? ? Many patients benefit from treatment even if their cholesterol is at goal. ?Goal: Total Cholesterol (CHOL) less than 160 ?Goal:  Triglycerides (TRIG) less than 150 ?Goal:  HDL greater than 40 ?Goal:  LDL (LDLCALC) less than 100 ?  ?BLOOD PRESSURE American Stroke Association blood pressure target is less that 120/80 mm/Hg  ?Your discharge blood pressure is:  BP: 134/68 Monitor your blood pressure ?Limit your salt and alcohol intake ?Many individuals will require more than one medication for high blood pressure  ?DIABETES (A1c is a blood sugar average for last 3 months) Goal HGBA1c is under 7% (HBGA1c is blood sugar average for last 3 months)  ?Diabetes: ?No known diagnosis of diabetes   ? ?Lab Results  ?Component Value Date  ? HGBA1C 5.1 06/13/2021  ? ? Your HGBA1c can be  lowered with medications, healthy diet, and exercise. ?Check your blood sugar as directed by your physician ?Call your physician if you experience unexplained or low blood sugars.  ?PHYSICAL ACTIVITY/REHABILITATION Goal is 30 minutes at least 4 days per week  ?Activity: Increase activity slowly, ?Therapies: Physical Therapy: Home Health ?Return to work:  Activity decreases your risk of heart attack and stroke and makes your heart stronger.  It helps control your weight and blood pressure; helps you relax and can improve your mood. ?Participate in a regular exercise program. ?Talk with your doctor about the best form of exercise for you (dancing, walking, swimming, cycling).  ?DIET/WEIGHT  Goal is to maintain a healthy weight  ?Your discharge diet is:  ?Diet Order   ? ?       ?  Diet Heart Room service appropriate? Yes; Fluid consistency: Thin  Diet effective now       ?  ? ?  ?  ? ?  ?  liquids ?Your height is:  Height: 5\' 5"  (165.1 cm) ?Your current weight is: Weight: 57.5 kg ?Your Body Mass Index (BMI) is:  BMI (Calculated): 21.09 Following the type of diet specifically designed for you will help prevent another stroke. ?Your goal weight range is:   ?Your goal Body Mass Index (BMI) is 19-24. ?Healthy food habits can help reduce 3 risk factors for stroke:  High cholesterol, hypertension, and excess weight.  ?RESOURCES Stroke/Support Group:  Call [phone] ?  ?STROKE EDUCATION PROVIDED/REVIEWED AND GIVEN TO PATIENT Stroke warning signs and symptoms ?How to activate emergency medical system (call 911). ?Medications prescribed at discharge. ?Need for follow-up after discharge. ?Personal risk factors for stroke. ?Pneumonia vaccine given: No ?Flu vaccine given: No ?My questions have been answered, the writing is legible, and I understand these instructions.  I will adhere to these goals & educational materials that have been provided to me after my discharge from the hospital.  ? ?  ?

## 2021-06-17 NOTE — IPOC Note (Signed)
Overall Plan of Care (IPOC) ?Patient Details ?Name: Susan Lewis ?MRN: [id number] ?DOB: June 04, 1960 ? ?Admitting Diagnosis: Left middle cerebral artery stroke (HCC) ? ?Hospital Problems: Principal Problem: ?  Left middle cerebral artery stroke (HCC) ?Active Problems: ?  Depression with anxiety ? ? ? ? Functional Problem List: ?Nursing Bladder, Medication Management, Safety, Pain, Endurance  ?PT Balance, Skin Integrity, Endurance, Motor, Nutrition, Pain, Perception, Behavior, Safety  ?OT Balance, Safety, Sensory, Cognition, Endurance, Motor, Perception  ?SLP Cognition  ?TR    ?    ? Basic ADL?s: ?OT Grooming, Bathing, Dressing, Toileting, Eating  ? ?  Advanced  ADL?s: ?OT    ?   ?Transfers: ?PT Bed Mobility, Bed to Chair, Car, Furniture, Floor  ?OT Toilet, Tub/Shower  ? ?  Locomotion: ?PT Ambulation, Stairs  ? ?  Additional Impairments: ?OT None  ?SLP Communication, Social Cognition ?comprehension, expression ?Awareness  ?TR    ? ? ?Anticipated Outcomes ?Item Anticipated Outcome  ?Self Feeding Mod I  ?Swallowing ?   ?  ?Basic self-care ? Mod I  ?Toileting ? Mod I ?  ?Bathroom Transfers Mod I toilet transfer, supervision tub transfer  ?Bowel/Bladder ? manage bladder w mod I assist  ?Transfers ? supervision using LRAD  ?Locomotion ? supervision using LRAD  ?Communication ? Sup-Min A  ?Cognition ? Sup A  ?Pain ? pain at or below level 4 w prns  ?Safety/Judgment ? maintain safety w cues  ? ?Therapy Plan: ?PT Intensity: Minimum of 1-2 x/day ,45 to 90 minutes ?PT Frequency: 5 out of 7 days ?PT Duration Estimated Length of Stay: ~7-10 days ?OT Intensity: Minimum of 1-2 x/day, 45 to 90 minutes ?OT Frequency: 5 out of 7 days ?OT Duration/Estimated Length of Stay: 10-12 days ?SLP Intensity: Minumum of 1-2 x/day, 30 to 90 minutes ?SLP Frequency: 3 to 5 out of 7 days ?SLP Duration/Estimated Length of Stay: 7-10 days  ? ?Due to the current state of emergency, patients may not be receiving their 3-hours of Medicare-mandated  therapy. ? ? Team Interventions: ?Nursing Interventions Bladder Management, Disease Management/Prevention, Medication Management, Discharge Planning, Pain Management, Patient/Family Education  ?PT interventions Ambulation/gait training, Community reintegration, DME/adaptive equipment instruction, Neuromuscular re-education, Psychosocial support, Stair training, UE/LE Strength taining/ROM, Warden/ranger, Discharge planning, Functional electrical stimulation, Pain management, Skin care/wound management, Therapeutic Activities, UE/LE Coordination activities, Cognitive remediation/compensation, Disease management/prevention, Functional mobility training, Patient/family education, Therapeutic Exercise, Visual/perceptual remediation/compensation, Splinting/orthotics  ?OT Interventions Balance/vestibular training, Disease mangement/prevention, Neuromuscular re-education, Self Care/advanced ADL retraining, Therapeutic Exercise, Wheelchair propulsion/positioning, Cognitive remediation/compensation, DME/adaptive equipment instruction, Pain management, Skin care/wound managment, UE/LE Strength taining/ROM, Community reintegration, Patient/family education, Functional electrical stimulation, Splinting/orthotics, UE/LE Coordination activities, Discharge planning, Functional mobility training, Psychosocial support, Visual/perceptual remediation/compensation, Therapeutic Activities  ?SLP Interventions Cognitive remediation/compensation, Cueing hierarchy, Functional tasks, Patient/family education, Internal/external aids, Speech/Language facilitation  ?TR Interventions    ?SW/CM Interventions Discharge Planning, Psychosocial Support, Patient/Family Education  ? ?Barriers to Discharge ?MD  Medical stability  ?Nursing Decreased caregiver support, Home environment access/layout ?1 level 4 ste w spouse  ?PT Home environment access/layout ?   ?OT   ?   ?SLP   ?   ?SW   ?   ? ?Team Discharge Planning: ?Destination: PT-Home  ,OT- Home , SLP-Home ?Projected Follow-up: PT-Outpatient PT, 24 hour supervision/assistance, OT-  None, SLP-Outpatient SLP, 24 hour supervision/assistance ?Projected Equipment Needs: PT-To be determined, OT- To be determined, SLP-None recommended by SLP ?Equipment Details: PT- , OT-  ?Patient/family involved in discharge planning: PT- Patient,  OT-Patient, Family member/caregiver, SLP-Patient ? ?  MD ELOS: 7-10d ?Medical Rehab Prognosis:  Good ?Assessment: The patient has been admitted for CIR therapies with the diagnosis of L MCA infarct. The team will be addressing functional mobility, strength, stamina, balance, safety, adaptive techniques and equipment, self-care, bowel and bladder mgt, patient and caregiver education, anxiety. Goals have been set at Mod I/Sup. Anticipated discharge destination is Home. ? ?Due to the current state of emergency, patients may not be receiving their 3 hours per day of Medicare-mandated therapy.  ?  ? ? ?See Team Conference Notes for weekly updates to the plan of care  ?

## 2021-06-18 ENCOUNTER — Ambulatory Visit: Payer: 59 | Admitting: Psychology

## 2021-06-18 DIAGNOSIS — M719 Bursopathy, unspecified: Secondary | ICD-10-CM

## 2021-06-18 DIAGNOSIS — D62 Acute posthemorrhagic anemia: Secondary | ICD-10-CM

## 2021-06-18 LAB — COMPREHENSIVE METABOLIC PANEL
ALT: 14 U/L (ref 0–44)
AST: 14 U/L — ABNORMAL LOW (ref 15–41)
Albumin: 3.5 g/dL (ref 3.5–5.0)
Alkaline Phosphatase: 76 U/L (ref 38–126)
Anion gap: 5 (ref 5–15)
BUN: 19 mg/dL (ref 8–23)
CO2: 27 mmol/L (ref 22–32)
Calcium: 9.2 mg/dL (ref 8.9–10.3)
Chloride: 106 mmol/L (ref 98–111)
Creatinine, Ser: 0.96 mg/dL (ref 0.44–1.00)
GFR, Estimated: >60 mL/min (ref >60)
Glucose, Bld: 99 mg/dL (ref 70–99)
Potassium: 4.2 mmol/L (ref 3.5–5.1)
Sodium: 138 mmol/L (ref 135–145)
Total Bilirubin: 0.3 mg/dL (ref 0.3–1.2)
Total Protein: 6.3 g/dL — ABNORMAL LOW (ref 6.5–8.1)

## 2021-06-18 LAB — CBC WITH DIFFERENTIAL/PLATELET
Abs Immature Granulocytes: 0.02 10*3/uL (ref 0.00–0.07)
Basophils Absolute: 0 10*3/uL (ref 0.0–0.1)
Basophils Relative: 0 %
Eosinophils Absolute: 0.1 10*3/uL (ref 0.0–0.5)
Eosinophils Relative: 2 %
HCT: 30.5 % — ABNORMAL LOW (ref 36.0–46.0)
Hemoglobin: 9.9 g/dL — ABNORMAL LOW (ref 12.0–15.0)
Immature Granulocytes: 0 %
Lymphocytes Relative: 19 %
Lymphs Abs: 1.1 10*3/uL (ref 0.7–4.0)
MCH: 29.3 pg (ref 26.0–34.0)
MCHC: 32.5 g/dL (ref 30.0–36.0)
MCV: 90.2 fL (ref 80.0–100.0)
Monocytes Absolute: 0.6 10*3/uL (ref 0.1–1.0)
Monocytes Relative: 10 %
Neutro Abs: 4.2 10*3/uL (ref 1.7–7.7)
Neutrophils Relative %: 69 %
Platelets: 214 10*3/uL (ref 150–400)
RBC: 3.38 MIL/uL — ABNORMAL LOW (ref 3.87–5.11)
RDW: 14 % (ref 11.5–15.5)
WBC: 6.1 10*3/uL (ref 4.0–10.5)
nRBC: 0 % (ref 0.0–0.2)

## 2021-06-18 MED ORDER — POTASSIUM CHLORIDE CRYS ER 20 MEQ PO TBCR
20.0000 meq | EXTENDED_RELEASE_TABLET | Freq: Every day | ORAL | Status: DC
  Administered 2021-06-19 – 2021-06-22 (×4): 20 meq via ORAL
  Filled 2021-06-18 (×4): qty 1

## 2021-06-18 NOTE — Progress Notes (Signed)
?                                                       PROGRESS NOTE ? ? ?Subjective/Complaints: ?Had a good night. Just got out of the shower with OT. Feels great.  ? ?ROS: Patient denies fever, rash, sore throat, blurred vision, dizziness, nausea, vomiting, diarrhea, cough, shortness of breath or chest pain, joint or back/neck pain, headache, or mood change.  ? ? ?Objective: ?  ?No results found. ?Recent Labs  ?  06/18/21 ?[id number]  ?WBC 6.1  ?HGB 9.9*  ?HCT 30.5*  ?PLT 214  ? ?Recent Labs  ?  06/18/21 ?[id number]  ?NA 138  ?K 4.2  ?CL 106  ?CO2 27  ?GLUCOSE 99  ?BUN 19  ?CREATININE 0.96  ?CALCIUM 9.2  ? ? ?Intake/Output Summary (Last 24 hours) at 06/18/2021 1336 ?Last data filed at 06/18/2021 0830 ?Gross per 24 hour  ?Intake 653 ml  ?Output --  ?Net 653 ml  ?  ? ?  ? ?Physical Exam: ?Vital Signs ?Blood pressure 134/68, pulse 64, temperature 98.6 ?F (37 ?C), temperature source Oral, resp. rate 17, height 5\' 5"  (1.651 m), weight 57.5 kg, SpO2 97 %. ? ? ?Constitutional: No distress . Vital signs reviewed. ?HEENT: NCAT, EOMI, oral membranes moist ?Neck: supple ?Cardiovascular: RRR without murmur. No JVD    ?Respiratory/Chest: CTA Bilaterally without wheezes or rales. Normal effort    ?GI/Abdomen: BS +, non-tender, non-distended ?Ext: no clubbing, cyanosis, or edema ?Psych: pleasant and cooperative  ?Skin: ecchymosis infra inguinal, pubic an dmedial thigh on right mildly tender no, no thrill right fem art ?Neurologic: speech slurred, delayed. Has word finding deficits. , motor strength is 5/5 in Left and 4/5 right  deltoid, bicep, tricep, grip, hip flexor, knee extensors, ankle dorsiflexor and plantar flexor. Good standing and ambulatory balance ?Sensory exam normal sensation to light touch and proprioception in bilateral upper extremities ?Cerebellar exam normal finger to nose to finger  left upper , moderate dysmetria , RIght upper ext ?Musculoskeletal: Full range of motion in all 4 extremities. No joint  swelling ? ? ?Assessment/Plan: ?1. Functional deficits which require 3+ hours per day of interdisciplinary therapy in a comprehensive inpatient rehab setting. ?Physiatrist is providing close team supervision and 24 hour management of active medical problems listed below. ?Physiatrist and rehab team continue to assess barriers to discharge/monitor patient progress toward functional and medical goals ? ?Care Tool: ? ?Bathing ?   ?Body parts bathed by patient: Right arm, Left lower leg, Left arm, Face, Chest, Abdomen, Front perineal area, Right upper leg, Right lower leg, Left upper leg, Buttocks  ? Body parts bathed by helper: Buttocks ?  ?  ?Bathing assist Assist Level: Minimal Assistance - Patient > 75% ?  ?  ?Upper Body Dressing/Undressing ?Upper body dressing   ?What is the patient wearing?: Pull over shirt ?   ?Upper body assist Assist Level: Set up assist ?   ?Lower Body Dressing/Undressing ?Lower body dressing ? ? ?   ?What is the patient wearing?: Pants ? ?  ? ?Lower body assist Assist for lower body dressing: Minimal Assistance - Patient > 75% ?   ? ?Toileting ?Toileting    ?Toileting assist Assist for toileting: Minimal Assistance - Patient > 75% ?  ?  ?Transfers ?Chair/bed transfer ? ?Transfers assist ?   ? ?  Chair/bed transfer assist level: Minimal Assistance - Patient > 75% (hand held assist) ?  ?  ?Locomotion ?Ambulation ? ? ?Ambulation assist ? ?   ? ?Assist level: Minimal Assistance - Patient > 75% ?Assistive device: No Device ?Max distance: 150  ? ?Walk 10 feet activity ? ? ?Assist ?   ? ?Assist level: Minimal Assistance - Patient > 75% ?Assistive device: No Device  ? ?Walk 50 feet activity ? ? ?Assist   ? ?Assist level: Minimal Assistance - Patient > 75% ?Assistive device: No Device  ? ? ?Walk 150 feet activity ? ? ?Assist   ? ?Assist level: Minimal Assistance - Patient > 75% ?Assistive device: No Device ?  ? ?Walk 10 feet on uneven surface  ?activity ? ? ?Assist   ? ? ?Assist level: Minimal  Assistance - Patient > 75% ?   ? ?Wheelchair ? ? ? ? ?Assist Is the patient using a wheelchair?: No ?  ?  ? ?  ?   ? ? ?Wheelchair 50 feet with 2 turns activity ? ? ? ?Assist ? ?  ?  ? ? ?   ? ?Wheelchair 150 feet activity  ? ? ? ?Assist ?   ? ? ?   ? ?Blood pressure 134/68, pulse 64, temperature 98.6 ?F (37 ?C), temperature source Oral, resp. rate 17, height 5\' 5"  (1.651 m), weight 57.5 kg, SpO2 97 %. ? ?Medical Problem List and Plan: ?1. 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            -patient may shower ?            -ELOS/Goals: 10-12 days, mod I to supervision with PT, OT, SLP ? -Continue CIR therapies including PT, OT, and SLP  ?2.  Antithrombotics: ?-DVT/anticoagulation:  Pharmaceutical: Lovenox ?            -antiplatelet therapy: Aspirin 81 mg daily and Brilinta 90 mg twice daily x6 months then aspirin alone ?3. Pain Management: Tylenol as needed ?4. Mood: BuSpar 15 mg twice daily, Zoloft 150 mg daily, trazodone 150 mg nightly Atarax as needed anxiety ?            -antipsychotic agents: N/A ?5. Neuropsych: This patient is capable of making decisions on her own behalf. ?6. Skin/Wound Care: Routine skin checks, extensive Right groin , pubic and medial thigh bruising at femoral sheath insertion site  ?7. Fluids/Electrolytes/Nutrition: encourage PO ? 4/3 I personally reviewed the patient's labs today.   ?8.  Hyperlipidemia.  Lipitor ?9.  Hypertension.  Norvasc 5 mg daily.  Monitor with increased mobility ?Vitals:  ? 06/17/21 1958 06/18/21 0506  ?BP: (!) 161/52 134/68  ?Pulse: 60 64  ?Resp: 18 17  ?Temp: 99.2 ?F (37.3 ?C) 98.6 ?F (37 ?C)  ?SpO2: 100% 97%  ?  4/3 controlled ?10.  History of tobacco as well as marijuana use.  Provide counseling ?11.  GERD.  Prilosec ?12. Anemia: hgb down to 9.9 today (has fluctuated honestly) ? -likely related to  bruising/hematoma a fem sheath insert site ? -check stool for OB ? -recheck labs Wednesday ?  ? ?LOS: ?3 days ?A FACE TO FACE EVALUATION WAS PERFORMED ? ?Meredith Staggers ?06/18/2021, 1:36 PM  ? ? ? ?

## 2021-06-18 NOTE — Progress Notes (Signed)
[facility]patient [facility] ?Individual Statement of Services ? ?Patient Name:  Susan Lewis  ?Date:  06/18/2021 ? ?Welcome to the [facility]patient [facility].  Our goal is to provide you with an individualized program based on your diagnosis and situation, designed to meet your specific needs.  With this comprehensive rehabilitation program, you will be expected to participate in at least 3 hours of rehabilitation therapies Monday-Friday, with modified therapy programming on the weekends. ? ?Your rehabilitation program will include the following services:  Physical Therapy (PT), Occupational Therapy (OT), Speech Therapy (ST), 24 hour per day rehabilitation nursing, Therapeutic Recreaction (TR), Care Coordinator, Rehabilitation Medicine, Nutrition Services, and Pharmacy Services ? ?Weekly team conferences will be held on Wednesday to discuss your progress.  Your Inpatient Rehabilitation Care Coordinator will talk with you frequently to get your input and to update you on team discussions.  Team conferences with you and your family in attendance may also be held. ? ?Expected length of stay: 10-12 days  Overall anticipated outcome: independent with device ? ?Depending on your progress and recovery, your program may change. Your Inpatient Rehabilitation Care Coordinator will coordinate services and will keep you informed of any changes. Your Inpatient Rehabilitation Care Coordinator's name and contact numbers are listed  below. ? ?The following services may also be recommended but are not provided by the Inpatient Rehabilitation Center:  ?Driving Evaluations ?Home Health Rehabiltiation Services ?Outpatient Rehabilitation Services ?Vocational Rehabilitation ?  ?Arrangements will be made to provide these services after discharge if needed.  Arrangements include referral to agencies that provide these services. ? ?Your insurance has been verified to be:  [employer] ?Your primary doctor is:  Herbie Drape ? ?Pertinent information will be shared with your doctor and your insurance company. ? ?Inpatient Rehabilitation Care Coordinator:  Lavera Guise, [place] [phone] or ([phone] ? ?Information discussed with and copy given to patient by: Lucy Chris, 06/18/2021, 10:21 AM    ?

## 2021-06-18 NOTE — Progress Notes (Signed)
Patient ID: Susan Lewis, female   DOB: [birth year], 61 y.o.   MRN: [id number] ?Met with the patient to introduce self, review role, rehab process and plan of care. Reviewed current medication for HTN , HLD, and DAPT (ASA + Brilinta x 6 months then ASA solo) per MD. Also reviewed smoking and ETOH cessation tips. Patient noted she has quit smoking; no cigarette since admission but she will need to work on her spouse as he still smokes. Has concerns about diet; choking/coughing at times w meals, poor dentition and elevated blood pressure. Reviewed DASH/HH diet and food options. Continue to follow along to discharge to address educational needs to facilitate preparation for discharge home with spouse. Dorien Chihuahua B ? ?

## 2021-06-18 NOTE — Progress Notes (Signed)
Inpatient Rehabilitation Care Coordinator ?Assessment and Plan ?Patient Details  ?Name: Susan Lewis ?MRN: [id number] ?Date of Birth: 14-Dec-1960 ? ?Today's Date: 06/18/2021 ? ?Hospital Problems: Principal Problem: ?  Left middle cerebral artery stroke (HCC) ?Active Problems: ?  Depression with anxiety ? ?Past Medical History:  ?Past Medical History:  ?Diagnosis Date  ? Anxiety   ? Arthritis   ? Bursitis   ? Carpal tunnel syndrome   ? COPD (chronic obstructive pulmonary disease) (HCC)   ? Depression   ? Fibromyalgia   ? Fibromyalgia   ? GERD (gastroesophageal reflux disease)   ? Hemorrhoids 10/27/2014  ? Hyperlipidemia   ? Hypertension   ? Kidney stones   ? ?Past Surgical History:  ?Past Surgical History:  ?Procedure Laterality Date  ? CARPAL TUNNEL RELEASE Right   ? carpel tunnel   ? CESAREAN SECTION    ? CHOLECYSTECTOMY    ? ENDOMETRIAL ABLATION    ? EXCISIONAL HEMORRHOIDECTOMY    ? IR ANGIO EXTRACRAN SEL COM CAROTID INNOMINATE UNI R MOD SED  06/12/2021  ? IR CT HEAD LTD  06/12/2021  ? IR CT HEAD LTD  06/12/2021  ? IR INTRAVSC STENT CERV CAROTID W/O EMB-PROT MOD SED INC ANGIO  06/12/2021  ? IR PERCUTANEOUS ART THROMBECTOMY/INFUSION INTRACRANIAL INC DIAG ANGIO  06/12/2021  ? OOPHORECTOMY Left   ? ORIF ANKLE FRACTURE BIMALLEOLAR    ? RADIOLOGY WITH ANESTHESIA N/A 06/12/2021  ? Procedure: IR WITH ANESTHESIA;  Surgeon: Radiologist, Medication, MD;  Location: [facility] OR;  Service: Radiology;  Laterality: N/A;  ? ?Social History:  reports that she has been smoking cigarettes. She started smoking about 49 years ago. She has a 45.00 pack-year smoking history. She has never used smokeless tobacco. She reports current drug use. Drug: Marijuana. She reports that she does not drink alcohol. ? 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 Pt will have assist at discharge, but hopes she will not need this. She is very independent ? ?Social History ?Preferred language: English ?Religion: None ?Cultural Background: No issues ?Education: HS ?Health Literacy - How often do you need to have someone help you when you read instructions, pamphlets, or other written material from your doctor or pharmacy?: Never ?Writes: Yes ?Employment Status: Employed ?Name of Employer: [employer] part time ?Return to Work Plans: unsure will need to see if her speech comes fully back ?Legal History/Current Legal Issues: No issues ?Guardian/Conservator: None-according to MD pt is capable of making her own decisions while here  ? ?Abuse/Neglect ?Abuse/Neglect Assessment Can Be Completed: Yes ?Physical Abuse: Denies ?Verbal Abuse: Denies ?Sexual Abuse: Denies ?Exploitation of patient/patient's resources: Denies ?Self-Neglect: Denies ? ?Patient response to: ?Social Isolation - How often do you feel lonely or isolated from those around you?: Never ? ?Emotional Status ?Pt's affect, behavior and adjustment status: Pt is motivated to do well and is pleased with her progress with her speech already. She is hopeful this will continue she is not one to sit still and likes to be moving. ?Recent Psychosocial Issues: other health issues ?Psychiatric History: History of depresion-PTSD on medications but may benefit from seeing neuro-psych while here, if here ong enough ?Substance Abuse History: Tobacco aware of the health issues with smoking and may quit but is not sure  at this time ? ?Patient / Family Perceptions, Expectations & Goals ?Pt/Family understanding of illness & functional limitations: Pt and sister are able to explain her stroke and deficits as a result of this. She does talk with the MD and feels she has a good  understanding of her treatment plan moving forward. ?Premorbid pt/family roles/activities: Wife, mom, sibling, employee, neighbor, etc ?Anticipated changes in roles/activities/participation: resume ?Pt/family expectations/goals: Pt states: " I hope to be able to take care of myself whe I leave here."  Sisters states: " I know she will do her best while here and recover from this." ? ?Community Resources ?Community Agencies: None ?Premorbid Home Care/DME Agencies: None ?Transportation available at discharge: family-pt did not drive PTA due to cataracts ?Is the patient able to respond to transportation needs?: Yes ?In the past 12 months, has lack of transportation kept you from medical appointments or from getting medications?: No ?In the past 12 months, has lack of transportation kept you from meetings, work, or from getting things needed for daily living?: No ?Resource referrals recommended: Neuropsychology ? ?Discharge Planning ?Living Arrangements: Spouse/significant other, Children ?Support Systems: Spouse/significant other, Children, Other relatives, Friends/neighbors ?Type of Residence: Private residence ?Insurance Resources: [employer] (specify) ([employer]) ?Financial Resources: Employment, Family Support ?Financial Screen Referred: No ?Living Expenses: Own ?Money Management: Patient, Spouse ?Does the patient have any problems obtaining your medications?: No ?Home Management: both pt and husband ?Patient/Family Preliminary Plans: Return home with her husband and son's between all of them she will have 24/7 care if this is needed. She is doing well and making progress which she hopes will continue. ?Care Coordinator Barriers to Discharge: Insurance for SNF coverage ?Care Coordinator Anticipated Follow Up Needs: HH/OP ? ?Clinical Impression ?Pleasant female who is motivated to do well and recover from this stroke. She has good family support and will have car eif needed at discharge. Aware of team  conference on Wednesday. Christina-BSW will resume case once returns tomorrow ? ?Lucy Chris ?06/18/2021, 12:03 PM ? ?  ?

## 2021-06-18 NOTE — Progress Notes (Signed)
Inpatient Rehabilitation  Patient information reviewed and entered into eRehab system by Jaiveon Suppes M. Anjoli Diemer, M.A., CCC/SLP, PPS Coordinator.  Information including medical coding, functional ability and quality indicators will be reviewed and updated through discharge.    

## 2021-06-18 NOTE — Progress Notes (Signed)
Physical Therapy Session Note ? ?Patient Details  ?Name: Susan Lewis ?MRN: [id number] ?Date of Birth: [birth year] ? ?Today's Date: 06/18/2021 ?PT Individual Time: 9024-0973 and [phone] ?PT Individual Time Calculation (min): 45 min and 57 min ? ?Short Term Goals: ?Week 1:  PT Short Term Goal 1 (Week 1): = to LTGs based on ELOS ? ?Skilled Therapeutic Interventions/Progress Updates:  ? ?First session: Pt presents almost fully sitting in bed and agreeable to therapy.  Pt transfers sup to sit from elevated HOB w/ CGA, although would reach for external support.  Pt attempts to use comb for hair but gets it stuck and is unaware still stuck in hair.  Pt transfers sit to stand w/ min A and then amb to sink w/o AD and min A.  Pt stands at sink to comb hair, attempting w/ R hand but continually drops comb.  Pt stood w/o UE support to manage toothpaste tube and brush teeth using R hand w/ increased concentration.  Pt amb to w/c x 6' for MD assessment in room.  Pt amb to 5th floor gym w/ min A, no AD x 120'.  Pt performed Nu-step at Level 4 x 10' for reciprocal LE/UE pattern, w/o difficulty maintaining RUE on handle.  Pt performed sit to stand w/ min to mod A w/o UE assist and verbal cues for sequencing especially forward lean.  Pt amb back to room and wished to return to bed.  Pt transferred sit to supine w/ supervision,  Bed alarm on and all needs in reach. ? ?Second session:Pt presents supine in bed and NT present to go to BR.  Pt handed off to PT for transfer OOB and then amb to BR w/o AD.  Pt amb to sink w/ verbal cues for locating soap and paper towels.  Pt wheeled in w/c to main gym for time conservation.  Pt performed sit to stand x 5-10 w/o UE support, verbal cues for initiation especially forward scoot and lean.  Pt performed standing on Airex cushion w/ min to occasional mod A x 1 for retropulsion on initial transfer.  Pt performed obstacle course stepping over canes and stepping on/off 6" platform.  Pt amb 150'  including turns, quick stops and starts as well as visually scanning side to side for objects.  Pt w/ difficulty choosing correct side.  Pt returned to 5th floor.  Pt amb > 100' from elevators to bed.  Pt requires supervision for sit to supine.  Bed alarm on and all needs in reach. ? ?    ? ?Therapy Documentation ?Precautions:  ?Precautions ?Precautions: Fall, Other (comment) ?Precaution Comments: aphasia, R hemiparesis ?Restrictions ?Weight Bearing Restrictions: No ?General: ?  ?Vital Signs: ?  ?Pain:0/10 ?Pain Assessment ?Pain Scale: 0-10 ?Pain Score: 0-No pain ?Mobility: ?  ? ? ? ? ?Therapy/Group: Individual Therapy ? ?[name] ?06/18/2021, 12:41 PM  ?

## 2021-06-18 NOTE — Progress Notes (Signed)
Occupational Therapy Session Note ? ?Patient Details  ?Name: Susan Lewis ?MRN: [id number] ?Date of Birth: [birth year] ? ?Today's Date: 06/18/2021 ?OT Individual Time: 4580-9983 ?OT Individual Time Calculation (min): 41 min  ? ? ?Short Term Goals: ?Week 1:  OT Short Term Goal 1 (Week 1): Pt will complete LB dressing with supervision ?OT Short Term Goal 2 (Week 1): Pt will complete bathing with supervision ?OT Short Term Goal 3 (Week 1): Pt will demonstrate ability to grasp objects with pads of fingers on R hand to increase ability to open containers and manage clothing ? ?Skilled Therapeutic Interventions/Progress Updates:  ?Pt greeted supine in bed agreeable to OT intervention. Session focus on BADL reeducation, functional mobility, dynamic standing balance and decreasing overall caregiver burden.    ?Pt completed supine>sit with supervision. Pt ambulated to bathroom with MIN HHA, pt completed 3/3 toileting task with MIN A needing assist to retrieve toilet paper as pt initially dropped it from her R hand. Pt enter shower with MIN A. Pt completed bathing from shower seat with overall MIN A, pt did sit>stand from shower seat to wash buttock with MIN A for safety and balance. Pt with some expressive difficulties but with increased time and effort able to phonate with MIN verbal cues. Pt noted to drop wash cloth often with RUE needing MIN verbal cues for safety to not lean too far out of BOS to retrieve cloths d/t impaired balance. Pt exited shower with MIN HHA. Pt completed dressign from EOB, set- up assist for UB dressing ande MINA  for LB dressing via sit>stand. pt left seated EOB with all needs within reach and bed alarm activated.                     ? ? ?Therapy Documentation ?Precautions:  ?Precautions ?Precautions: Fall, Other (comment) ?Precaution Comments: aphasia, R hemiparesis ?Restrictions ?Weight Bearing Restrictions: No ? ?Pain: no pain reported during session  ? ? ? ?Therapy/Group: Individual Therapy ? ?Barron Schmid ?06/18/2021, 8:49 AM ?

## 2021-06-19 MED ORDER — ACETAMINOPHEN 650 MG RE SUPP: 650.0000 mg | RECTAL | Status: DC | PRN

## 2021-06-19 MED ORDER — ACETAMINOPHEN 325 MG PO TABS
650.0000 mg | ORAL_TABLET | ORAL | Status: DC | PRN
  Administered 2021-06-19: 650 mg via ORAL
  Filled 2021-06-19: qty 2

## 2021-06-19 NOTE — Progress Notes (Signed)
Speech Language Pathology Daily Session Note ? ?Patient Details  ?Name: Susan Lewis ?MRN: [id number] ?Date of Birth: Aug 09, 1960 ? ?Today's Date: 06/19/2021 ?SLP Individual Time: 1300-1400 ?SLP Individual Time Calculation (min): 60 min ? ?Short Term Goals: ?Week 1: SLP Short Term Goal 1 (Week 1): STG=LTG due to short ELOS ? ?Skilled Therapeutic Interventions: Skilled ST treatment focused on language goals. SLP facilitated session by providing min-to-mod A for word finding using various word finding strategies, as well as mod A for use of semantic feature analysis for object/place description. Pt benefited from additional time, semantic, and phonemic cues. SLP facilitated naming opposites with min A semantic or cloze phrase cues. Pt exhibited frequent halting and breakdowns attributed to word finding and was aware of errors. Pt with ability to repair approximately 25% of communication breakdowns without support, and progressed to ~80% with moderate skilled cueing and questioning. Patient was left in bed with alarm activated and immediate needs within reach at end of session. Continue per current plan of care.   ?   ?Pain ?Pain Assessment ?Pain Scale: 0-10 ?Pain Score: 0-No pain ? ?Therapy/Group: Individual Therapy ? ?Jeret Goyer T Coleta Grosshans ?06/19/2021, 1:21 PM ?

## 2021-06-19 NOTE — Progress Notes (Signed)
Occupational Therapy Session Note ? ?Patient Details  ?Name: Susan Lewis ?MRN: [id number] ?Date of Birth: 1961-03-09 ? ?Today's Date: 06/19/2021 ?OT Individual Time: [id number] ?OT Individual Time Calculation (min): 53 min  ? ? ?Short Term Goals: ?Week 1:  OT Short Term Goal 1 (Week 1): Pt will complete LB dressing with supervision ?OT Short Term Goal 2 (Week 1): Pt will complete bathing with supervision ?OT Short Term Goal 3 (Week 1): Pt will demonstrate ability to grasp objects with pads of fingers on R hand to increase ability to open containers and manage clothing ? ?Skilled Therapeutic Interventions/Progress Updates:  ?Pt greeted supine in bed agreeable to OT intervention. Session focus on BADL reeducation, functional mobility, dynamic standing balance, RUE coordination and decreasing overall caregiver burden.  ?Pt completed supine>sit with supervision. Pts husband called and needed pt to access her email on her phone, pt needed MAX verbal/instructional cues to locate email app on her phone. Husband needed pt  to read code to him from email pt unable to state numbers from phone.  ?Pt donned socks from EOB with set- up assist.      ?Pt ambulated to sink with no AD and CGA- supervision. Pt completed standing oral care and washing her face with supervision. Pt ambulated to toilet for toileting with no AD and CGA, pt completed 3/3 toileting tasks with supervision.  ?Pt ambulated to gym on [place] with no AD and CGA. Worked on RUE [facility], pt instructed to collect as many small puzzles pieces with RUE as she could in 1 min and then complete same task with LUE. Results are as indicated below:  ?RUE 3 pieces ?LUE 29 pieces ?Worked on counting to 29 with pt needing MOD visual/verbal cues to recall correct number in sequence. ?Pt completed therapeutic activity of picking up clothespins with RUE and placing on cup with overall supervision, noted motor apraxia and more gross grasp d/t impaired [place] in RUE. Worked on in Paediatric nurse such as shifting in order to refine more of a precision grasp with clothespin therapeutic activity.  ?Also issued pt level 2 theraputty to work on [facility] and hand strength with an emphasis on pincer grasp, composite digit flexon/extension, and MP flexion/extension. Pt completed theraputty HEP with supervision.   Pt ambulated back to room with CGA with no AD. Pt left supine in bed with bed alarm activated and all needs within reach.                      ?Therapy Documentation ?Precautions:  ?Precautions ?Precautions: Fall, Other (comment) ?Precaution Comments: aphasia, R hemiparesis ?Restrictions ?Weight Bearing Restrictions: No ? ?Pain: no pain reported during session  ? ? ? ?Therapy/Group: Individual Therapy ? ?Precious Haws ?06/19/2021, 12:06 PM ?

## 2021-06-19 NOTE — Progress Notes (Signed)
Physical Therapy Session Note ? ?Patient Details  ?Name: Susan Lewis ?MRN: [id number] ?Date of Birth: 04/05/1960 ? ?Today's Date: 06/19/2021 ?PT Individual Time: 7619-5093 ?PT Individual Time Calculation (min): 69 min  ? ?Short Term Goals: ?Week 1:  PT Short Term Goal 1 (Week 1): = to LTGs based on ELOS ? ?Skilled Therapeutic Interventions/Progress Updates:  ?  Pt received supine in bed resting and reporting fatigue but agreeable to therapy session. Supine>sitting L EOB, HOB flat and not using bedrail, with supervision. Pt donned her shoes set-up assist. Sit<>stands, no AD, with CGA for safety throughout session.  ? ?Gait training >1,020ft, no AD, (only 1x seated rest break) including on/off elevator down to 1st floor exit including outdoor ambulation - CGA for steadying throughout and no instances of significant LOB; however, does continue to have decreased gait speed - pt did notice to have possible R inattention and possibly impaired vision/awareness with difficulty identifying and locating the elevator buttons.  ?During gait training, performed dual-task of identifying and verbally stating names of objects in environment - pt able to verbalize simple, more familiar words more easily but has significant difficulty reading more complex/unfamiliar words. ? ?Once back up on CIR. ? ?Stair navigation training ascending/descending 4 steps (6" height) x3 reps lightly using B HRs with CGA for safety/steadying - reciprocal pattern in both directions with no significant gait deviations noted in R LE.  ? ?Gait training ~117ft to ADL apartment, no AD, with CGA progressing to close supervision - cuing for increased gait speed and more fluid movements. ? ?In ADL apartment, working on standing balance with dual-task of word finding to name fruit when pt suddenly reports feeling "sick on my stomach."  ? ?Provided seated rest break and assessed vitals as follows as well as provided pt with ginger ale to drink and a cool washcloth.   ?Seated: BP 122/71 (MAP 88), HR 92bpm  ?Standing: BP 106/95 (MAP 101), HR 98bpm  ? ?Once symptoms dissipated (no emesis) pt ambulated back up to room ~147ft, no AD, with light min assist at this time for safety since pt not feeling well. ? ?Pt reports she didn't eat much for lunch because she is a very picky eater and per chart pt's last BM on 4/4. Pt agreeable to attempt using bathroom. Gait in/out bathroom, no AD, with CGA for safety - able to manage LB clothing and perform peri-care without assist. Hand hygiene at sink with close supervision for balance safety. ? ?Pt suddenly reports another wave of nausea. Pt left supine in bed with needs in reach, saltine crackers provided, and bed alarm on. Morrie Sheldon, nurse notified of pt's symptoms as well as pt's husband, Loraine Leriche, who arrived as therapist was exiting. ? ? ?Therapy Documentation ?Precautions:  ?Precautions ?Precautions: Fall, Other (comment) ?Precaution Comments: aphasia, R hemiparesis ?Restrictions ?Weight Bearing Restrictions: No ? ?Pain: ?No reports of pain throughout session. ? ? ? ?Therapy/Group: Individual Therapy ? ?Ginny Forth , PT, DPT, NCS, CSRS ?06/19/2021, 3:17 PM  ?

## 2021-06-19 NOTE — Progress Notes (Signed)
?                                                       PROGRESS NOTE ? ? ?Subjective/Complaints: ?No issues overnite , exp aphasia moderate  ?Still dropping objects with R hand  ? ?ROS: Patient denies CP, SOB, N/V/D ? ? ?Objective: ?  ?No results found. ?Recent Labs  ?  06/18/21 ?[id number]  ?WBC 6.1  ?HGB 9.9*  ?HCT 30.5*  ?PLT 214  ? ? ?Recent Labs  ?  06/18/21 ?[id number]  ?NA 138  ?K 4.2  ?CL 106  ?CO2 27  ?GLUCOSE 99  ?BUN 19  ?CREATININE 0.96  ?CALCIUM 9.2  ? ? ? ?Intake/Output Summary (Last 24 hours) at 06/19/2021 [id number] ?Last data filed at 06/18/2021 1320 ?Gross per 24 hour  ?Intake 480 ml  ?Output --  ?Net 480 ml  ? ?  ? ?  ? ?Physical Exam: ?Vital Signs ?Blood pressure (!) 148/89, pulse 72, temperature 98.7 ?F (37.1 ?C), resp. rate 18, height 5\' 5"  (1.651 m), weight 57.5 kg, SpO2 97 %. ? ?General: No acute distress ?Mood and affect are appropriate ?Heart: Regular rate and rhythm no rubs murmurs or extra sounds ?Lungs: Clear to auscultation, breathing unlabored, no rales or wheezes ?Abdomen: Positive bowel sounds, soft nontender to palpation, nondistended ?Extremities: No clubbing, cyanosis, or edema ?Skin: No evidence of breakdown, no evidence of rash ? ?Skin: ecchymosis infra inguinal, pubic an dmedial thigh on right mildly tender no, no thrill right fem art ?Neurologic: speech slurred, delayed. Has word finding deficits. , motor strength is 5/5 in Left and 4/5 right  deltoid, bicep, tricep, grip, hip flexor, knee extensors, ankle dorsiflexor and plantar flexor. Good standing and ambulatory balance ?Sensory exam normal sensation to light touch in LEs reduced RUE  ?Cerebellar exam normal finger to nose to finger  left upper , moderate dysmetria , RIght upper ext ?Musculoskeletal: Full range of motion in all 4 extremities. No joint swelling ? ? ?Assessment/Plan: ?1. Functional deficits which require 3+ hours per day of interdisciplinary therapy in a comprehensive inpatient rehab setting. ?Physiatrist is providing close  team supervision and 24 hour management of active medical problems listed below. ?Physiatrist and rehab team continue to assess barriers to discharge/monitor patient progress toward functional and medical goals ? ?Care Tool: ? ?Bathing ?   ?Body parts bathed by patient: Right arm, Left lower leg, Left arm, Face, Chest, Abdomen, Front perineal area, Right upper leg, Right lower leg, Left upper leg, Buttocks  ? Body parts bathed by helper: Buttocks ?  ?  ?Bathing assist Assist Level: Minimal Assistance - Patient > 75% ?  ?  ?Upper Body Dressing/Undressing ?Upper body dressing   ?What is the patient wearing?: Pull over shirt ?   ?Upper body assist Assist Level: Set up assist ?   ?Lower Body Dressing/Undressing ?Lower body dressing ? ? ?   ?What is the patient wearing?: Pants ? ?  ? ?Lower body assist Assist for lower body dressing: Minimal Assistance - Patient > 75% ?   ? ?Toileting ?Toileting    ?Toileting assist Assist for toileting: Minimal Assistance - Patient > 75% ?  ?  ?Transfers ?Chair/bed transfer ? ?Transfers assist ?   ? ?Chair/bed transfer assist level: Minimal Assistance - Patient > 75% (hand held assist) ?  ?  ?Locomotion ?  Ambulation ? ? ?Ambulation assist ? ?   ? ?Assist level: Minimal Assistance - Patient > 75% ?Assistive device: No Device ?Max distance: 150  ? ?Walk 10 feet activity ? ? ?Assist ?   ? ?Assist level: Minimal Assistance - Patient > 75% ?Assistive device: No Device  ? ?Walk 50 feet activity ? ? ?Assist   ? ?Assist level: Minimal Assistance - Patient > 75% ?Assistive device: No Device  ? ? ?Walk 150 feet activity ? ? ?Assist   ? ?Assist level: Minimal Assistance - Patient > 75% ?Assistive device: No Device ?  ? ?Walk 10 feet on uneven surface  ?activity ? ? ?Assist   ? ? ?Assist level: Minimal Assistance - Patient > 75% ?   ? ?Wheelchair ? ? ? ? ?Assist Is the patient using a wheelchair?: No ?  ?  ? ?  ?   ? ? ?Wheelchair 50 feet with 2 turns activity ? ? ? ?Assist ? ?  ?  ? ? ?    ? ?Wheelchair 150 feet activity  ? ? ? ?Assist ?   ? ? ?   ? ?Blood pressure (!) 148/89, pulse 72, temperature 98.7 ?F (37.1 ?C), resp. rate 18, height 5\' 5"  (1.651 m), weight 57.5 kg, SpO2 97 %. ? ?Medical Problem List and Plan: ?1. 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            -patient may shower ?            -ELOS/Goals: 10-12 days, mod I to supervision with PT, OT, SLP ? -Continue CIR therapies including PT, OT, and SLP - team conf in am  ?2.  Antithrombotics: ?-DVT/anticoagulation:  Pharmaceutical: Lovenox ?            -antiplatelet therapy: Aspirin 81 mg daily and Brilinta 90 mg twice daily x6 months then aspirin alone ?3. Pain Management: Tylenol as needed ?4. Mood: BuSpar 15 mg twice daily, Zoloft 150 mg daily, trazodone 150 mg nightly Atarax as needed anxiety ?            -antipsychotic agents: N/A ?5. Neuropsych: This patient is capable of making decisions on her own behalf. ?6. Skin/Wound Care: Routine skin checks, extensive Right groin , pubic and medial thigh bruising at femoral sheath insertion site  ?7. Fluids/Electrolytes/Nutrition: encourage PO ? 4/3 I personally reviewed the patient's labs today.   ?8.  Hyperlipidemia.  Lipitor ?9.  Hypertension.  Norvasc 5 mg daily.  Monitor with increased mobility ?Vitals:  ? 06/18/21 2012 06/19/21 0347  ?BP: 114/86 (!) 148/89  ?Pulse: 69 72  ?Resp: 17 18  ?Temp: 98 ?F (36.7 ?C) 98.7 ?F (37.1 ?C)  ?SpO2: 99% 97%  ?  4/4 controlled ?10.  History of tobacco as well as marijuana use.  Provide counseling ?11.  GERD.  Prilosec ?12. Anemia: hgb down to 9.9 today (has fluctuated honestly) ? -likely related to bruising/hematoma a fem sheath insert site ? -check stool for OB ? -recheck labs Wednesday ?  ?13.  Dropping objects R hand due to sensory problems related to CVA discussed with pt ?LOS: ?4 days ?A FACE TO  FACE EVALUATION WAS PERFORMED ? ?Luanna Salk Ziana Heyliger ?06/19/2021, 8:12 AM  ? ? ? ?

## 2021-06-20 LAB — CBC
HCT: 33.2 % — ABNORMAL LOW (ref 36.0–46.0)
Hemoglobin: 10.8 g/dL — ABNORMAL LOW (ref 12.0–15.0)
MCH: 29.4 pg (ref 26.0–34.0)
MCHC: 32.5 g/dL (ref 30.0–36.0)
MCV: 90.5 fL (ref 80.0–100.0)
Platelets: 293 10*3/uL (ref 150–400)
RBC: 3.67 MIL/uL — ABNORMAL LOW (ref 3.87–5.11)
RDW: 14 % (ref 11.5–15.5)
WBC: 7.3 10*3/uL (ref 4.0–10.5)
nRBC: 0 % (ref 0.0–0.2)

## 2021-06-20 LAB — OCCULT BLOOD X 1 CARD TO LAB, STOOL: Fecal Occult Bld: NEGATIVE

## 2021-06-20 NOTE — Discharge Summary (Signed)
Physician Discharge Summary  ?Patient ID: ?Susan Lewis ?MRN: [id number] ?DOB/AGE: [birth year] 63 y.o. ? ?Admit date: 06/15/2021 ?Discharge date: 06/22/2021 ? ?Discharge Diagnoses:  ?Principal Problem: ?  Left middle cerebral artery stroke (HCC) ?Active Problems: ?  Depression with anxiety ?  Bursitis ?DVT prophylaxis ?Hyperlipidemia ?Hypertension ?History of tobacco marijuana use ?GERD ?Anemia ? ?Discharged Condition: Stable ? ?Significant Diagnostic Studies: ?CT HEAD WO CONTRAST ( ) ? ?Result Date: 06/12/2021 ?CLINICAL DATA:  Stroke, follow-up, status post percutaneous intervention EXAM: CT HEAD WITHOUT CONTRAST TECHNIQUE: Contiguous axial images were obtained from the base of the skull through the vertex without intravenous contrast. RADIATION DOSE REDUCTION: This exam was performed according to the departmental dose-optimization program which includes automated exposure control, adjustment of the mA and/or kV according to patient size and/or use of iterative reconstruction technique. COMPARISON:  CT head 06/12/2021 9:15 a.m, CT head from thrombectomy 06/12/2021 11:30 a.m. FINDINGS: Brain: Hyperdensity in the left MCA territory (series 3, images 19-24), which may represent petechial hemorrhage versus post thrombectomy contrast staining, which appears similar to the CT done immediately after intervention. Along the course of the left MCA, particularly the M1 and M2 segments, there is hyperdensity, which may represent subarachnoid hemorrhage versus contrast extravasation from the left MCA, which also appears similar to the CT done immediately after intervention. No new area of hypodensity to suggest acute infarct. No mass, mass effect, or midline shift. No hydrocephalus. Vascular: No hyperdense vessel. Skull: Normal. Negative for fracture or focal lesion. Sinuses/Orbits: No acute finding. Other: Trace fluid in left mastoid air cells. IMPRESSION: Status post thrombectomy, with hyperdensity in the left MCA territory,  favored to represent post thrombectomy contrast staining although petechial hemorrhage could appear similar. Additional hyperdensity along the course of the left M1 and M2, which may represent contrast extravasation from the left MCA versus subarachnoid hemorrhage. These areas of hyperdensity appear similar to the CT done immediately after intervention. Electronically Signed   By: Wiliam Ke M.D.   On: 06/12/2021 15:55  ? ?MR BRAIN WO CONTRAST ? ?Result Date: 06/13/2021 ?CLINICAL DATA:  Stroke follow-up post thrombectomy EXAM: MRI HEAD WITHOUT CONTRAST TECHNIQUE: Multiplanar, multiecho pulse sequences of the brain and surrounding structures were obtained without intravenous contrast. COMPARISON:  CT and CTA head/neck dated 1 day prior FINDINGS: Brain: There is diffusion restriction in the left caudate body, lentiform nucleus, insula, and parietal and temporal lobes consistent with evolving MCA distribution infarct likely superimposed on a pre-existing remote MCA distribution infarct. There is no evidence of hemorrhagic transformation. Is no significant mass effect or midline shift. There is no significant SWI signal dropout overlying the infarct territory or within the sylvian fissure or MCA cistern, suggesting of the hyperdensity seen on prior CT primarily reflects contrast staining. There is no evidence of new acute intracranial hemorrhage or extra-axial fluid collection. Background parenchymal volume is normal. The ventricles are normal in size. Parenchymal signal is otherwise normal. There is no solid mass lesion.  There is no midline shift. Vascular: The major intracranial flow voids are present. Skull and upper cervical spine: Normal marrow signal. Sinuses/Orbits: The paranasal sinuses are clear. The globes and orbits are grossly unremarkable. Other: None. IMPRESSION: 1. Scattered diffusion restriction in the MCA territory as described above consistent with evolving acute infarcts. No evidence of hemorrhagic  transformation or significant mass effect. 2. No appreciable SWI signal dropout in the regions of hyperdensity seen on the prior CT, suggesting that the hyperdensity primarily reflects contrast staining. Electronically Signed   By: Lesia Hausen  M.D.   On: 06/13/2021 11:59  ? ?IR INTRAVSC STENT CERV CAROTID W/O EMB-PROT MOD SED ? ?Result Date: 06/13/2021 ?INDICATION: New onset aphasia, right-sided hemiplegia. Occluded left internal carotid artery proximally, inferior division of the left middle cerebral artery proximally on CT angiogram of the head and neck. EXAM: 1. EMERGENT LARGE VESSEL OCCLUSION THROMBOLYSIS (anterior CIRCULATION) COMPARISON:  CT angiogram of the head and neck of June 12, 2021. MEDICATIONS: Vancomycin 1 g IV antibiotic was administered within 1 hour of the procedure. ANESTHESIA/SEDATION: General anesthesia CONTRAST:  Omnipaque 300 approximately 85 mL. FLUOROSCOPY TIME:  Fluoroscopy Time: 34 minutes 36 seconds (1891 mGy). COMPLICATIONS: None immediate. TECHNIQUE: Following a full explanation of the procedure along with the potential associated complications, an informed witnessed consent was obtained. The risks of intracranial hemorrhage of 10%, worsening neurological deficit, ventilator dependency, death and inability to revascularize were all reviewed in detail with the patient's spouse. The patient was then put under general anesthesia by the Department of Anesthesiology at [facility]. The right groin was prepped and draped in the usual sterile fashion. Thereafter using modified Seldinger technique, transfemoral access into the right common femoral artery was obtained without difficulty. Over a 0.035 inch guidewire an 8 [place] Pinnacle sheath was inserted. Through this, and also over a 0.035 inch guidewire a 5 [place] JB 1 catheter was advanced to the aortic arch region and selectively positioned in the left common carotid artery and the right common carotid artery. FINDINGS: The left  common carotid arteriogram demonstrates mild to moderate stenosis at the origin of the left external carotid artery. Its branches, however, opacify normally. The left internal carotid artery at the bulb has a stump without angiographic delayed reconstitution of the left internal carotid artery in the neck. No evidence of a string sign is seen. More distally, partial opacification is seen of the cavernous left carotid with flash filling of the left M1 segment. PROCEDURE: The diagnostic JB 1 catheter in the left common carotid artery was then exchanged over a 0.035 inch 300 cm Rosen exchange guidewire for a 95 cm 087 balloon guide catheter which had been prepped with 50% contrast and 50% heparinized saline infusion. Good aspiration was obtained at the hub of the balloon guide catheter. A gentle control arteriogram performed through this demonstrated no change in the left internal carotid artery opacification. Over a 0.014 inch soft tip Aristotle micro guidewire with a moderate J configuration an 021 150 cm microcatheter was advanced to the bulb of the left ICA. Using a torque device, access through the occluded left internal carotid artery was obtained with the micro guidewire followed by microcatheter which was advanced to the horizontal petrous segment. The guidewire was removed. Good aspiration obtained from the hub of the microcatheter. This in turn was then exchanged for a 300 cm 014 inch Synchro stiff micro guidewire with a moderate J configuration. A 4 mm x 30 mm Viatrac 14 angioplasty balloon microcatheter was then prepped antegradely with 75% contrast and 25% heparinized saline infusion and retrogradely with heparinized saline infusion. Using the rapid exchange technique, this was then advanced under fluoroscopic guidance and positioned with the proximal and distal markers adequate distance from the site left ICA. An angioplasty was then performed with a micro inflation syringe device via micro tubing with  slow commands to normal atmospheric pressure of 8 atmospheres. Balloon was left inflated for approximately 45 seconds. Thereafter, the balloon was deflated and retrieved and removed. A control arteriogram per

## 2021-06-20 NOTE — Progress Notes (Signed)
?                                                       PROGRESS NOTE ? ? ?Subjective/Complaints: ?Labs reviewed Hgb trending up  ? ?ROS: Patient denies CP, SOB, N/V/D ? ? ?Objective: ?  ?No results found. ?Recent Labs  ?  06/18/21 ?0998 06/20/21 ?0507  ?WBC 6.1 7.3  ?HGB 9.9* 10.8*  ?HCT 30.5* 33.2*  ?PLT 214 293  ? ? ?Recent Labs  ?  06/18/21 ?3382  ?NA 138  ?K 4.2  ?CL 106  ?CO2 27  ?GLUCOSE 99  ?BUN 19  ?CREATININE 0.96  ?CALCIUM 9.2  ? ? ? ?Intake/Output Summary (Last 24 hours) at 06/20/2021 0814 ?Last data filed at 06/19/2021 1340 ?Gross per 24 hour  ?Intake 617 ml  ?Output --  ?Net 617 ml  ? ?  ? ?  ? ?Physical Exam: ?Vital Signs ?Blood pressure 125/79, pulse 99, temperature 98.7 ?F (37.1 ?C), resp. rate 17, height _0  (1.651 m), weight 57.5 kg, SpO2 99 %. ? ?General: No acute distress ?Mood and affect are appropriate ?Heart: Regular rate and rhythm no rubs murmurs or extra sounds ?Lungs: Clear to auscultation, breathing unlabored, no rales or wheezes ?Abdomen: Positive bowel sounds, soft nontender to palpation, nondistended ?Extremities: No clubbing, cyanosis, or edema ?Skin: No evidence of breakdown, no evidence of rash ? ?Skin: ecchymosis infra inguinal, pubic an dmedial thigh on right mildly tender no, no thrill right fem art ?Neurologic: speech slurred, delayed. Has word finding deficits. , motor strength is 5/5 in Left and 4/5 right  deltoid, bicep, tricep, grip, hip flexor, knee extensors, ankle dorsiflexor and plantar flexor. Good standing and ambulatory balance ?Sensory exam normal sensation to light touch in LEs reduced RUE  ?Cerebellar exam normal finger to nose to finger  left upper , moderate dysmetria , RIght upper ext ?Musculoskeletal: Full range of motion in all 4 extremities. No joint swelling ? ? ?Assessment/Plan: ?1. Functional deficits which require 3+ hours per day of interdisciplinary therapy in a comprehensive inpatient rehab setting. ?Physiatrist is providing close team supervision and  24 hour management of active medical problems listed below. ?Physiatrist and rehab team continue to assess barriers to discharge/monitor patient progress toward functional and medical goals ? ?Care Tool: ? ?Bathing ?   ?Body parts bathed by patient: Right arm, Left lower leg, Left arm, Face, Chest, Abdomen, Front perineal area, Right upper leg, Right lower leg, Left upper leg, Buttocks  ? Body parts bathed by helper: Buttocks ?  ?  ?Bathing assist Assist Level: Minimal Assistance - Patient > 75% ?  ?  ?Upper Body Dressing/Undressing ?Upper body dressing   ?What is the patient wearing?: Pull over shirt ?   ?Upper body assist Assist Level: Set up assist ?   ?Lower Body Dressing/Undressing ?Lower body dressing ? ? ?   ?What is the patient wearing?: Pants ? ?  ? ?Lower body assist Assist for lower body dressing: Minimal Assistance - Patient > 75% ?   ? ?Toileting ?Toileting    ?Toileting assist Assist for toileting: Contact Guard/Touching assist ?  ?  ?Transfers ?Chair/bed transfer ? ?Transfers assist ?   ? ?Chair/bed transfer assist level: Contact Guard/Touching assist ?  ?  ?Locomotion ?Ambulation ? ? ?Ambulation assist ? ?   ? ?Assist level: Minimal  Assistance - Patient > 75% ?Assistive device: No Device ?Max distance: >1,016f  ? ?Walk 10 feet activity ? ? ?Assist ?   ? ?Assist level: Minimal Assistance - Patient > 75% ?Assistive device: No Device  ? ?Walk 50 feet activity ? ? ?Assist   ? ?Assist level: Minimal Assistance - Patient > 75% ?Assistive device: No Device  ? ? ?Walk 150 feet activity ? ? ?Assist   ? ?Assist level: Minimal Assistance - Patient > 75% ?Assistive device: No Device ?  ? ?Walk 10 feet on uneven surface  ?activity ? ? ?Assist   ? ? ?Assist level: Minimal Assistance - Patient > 75% ?   ? ?Wheelchair ? ? ? ? ?Assist Is the patient using a wheelchair?: No ?  ?  ? ?  ?   ? ? ?Wheelchair 50 feet with 2 turns activity ? ? ? ?Assist ? ?  ?  ? ? ?   ? ?Wheelchair 150 feet activity  ? ? ? ?Assist ?    ? ? ?   ? ?Blood pressure 125/79, pulse 99, temperature 98.7 ?F (37.1 ?C), resp. rate 17, height _0  (1.651 m), weight 57.5 kg, SpO2 99 %. ? ?Medical Problem List and Plan: ?1. 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            -patient may shower ?            -ELOS/Goals: 10-12 days, mod I to supervision with PT, OT, SLP ? -Continue CIR therapies including PT, OT, and SLP - Team conference today please see physician documentation under team conference tab, met with team  to discuss problems,progress, and goals. Formulized individual treatment plan based on medical history, underlying problem and comorbidities. ? ?2.  Antithrombotics: ?-DVT/anticoagulation:  Pharmaceutical: Lovenox ?            -antiplatelet therapy: Aspirin 81 mg daily and Brilinta 90 mg twice daily x6 months then aspirin alone ?3. Pain Management: Tylenol as needed ?4. Mood: BuSpar 15 mg twice daily, Zoloft 150 mg daily, trazodone 150 mg nightly Atarax as needed anxiety ?            -antipsychotic agents: N/A ?5. Neuropsych: This patient is capable of making decisions on her own behalf. ?6. Skin/Wound Care: Routine skin checks, extensive Right groin , pubic and medial thigh bruising at femoral sheath insertion site  ?7. Fluids/Electrolytes/Nutrition: encourage PO ? 4/3 I personally reviewed the patient's labs today.   ?8.  Hyperlipidemia.  Lipitor ?9.  Hypertension.  Norvasc 5 mg daily.  Monitor with increased mobility ?Vitals:  ? 06/19/21 2029 06/20/21 0644  ?BP: (!) 142/68 125/79  ?Pulse: 62 99  ?Resp: 18 17  ?Temp: 98.6 ?F (37 ?C) 98.7 ?F (37.1 ?C)  ?SpO2: 98% 99%  ?  4/5 controlled ?10.  History of tobacco as well as marijuana use.  Provide counseling ?11.  GERD.  Prilosec ?12. Anemia: hgb down to 9.9 today (has fluctuated honestly) ? -likely related to bruising/hematoma a fem sheath insert  site ? -check stool for OB ? -recheck labs Wednesday ?  ?13.  Dropping objects R hand due to sensory problems related to CVA discussed with pt ?LOS: ?5 days ?A FACE TO FACE EVALUATION WAS PERFORMED ? ?ALuanna SalkKirsteins ?06/20/2021, 8:14 AM  ? ? ? ?

## 2021-06-20 NOTE — Progress Notes (Signed)
Physical Therapy Session Note ? ?Patient Details  ?Name: Susan Lewis ?MRN: [id number] ?Date of Birth: [birth year] ? ?Today's Date: 06/20/2021 ?PT Individual Time: 3154-0086 ?PT Individual Time Calculation (min): 50 min  ? ?Short Term Goals: ?Week 1:  PT Short Term Goal 1 (Week 1): = to LTGs based on ELOS ? ?Skilled Therapeutic Interventions/Progress Updates:  ?  Pt received supine in bed with her son, Judie Grieve, and husband, Loraine Leriche, present and pt agreeable to therapy session. Pt's son agreeable to participate in education today and present throughout session. ? ?Supine>sitting L EOB, supervision.  ? ?Sit<>stands, no AD, with supervision for safety during session. Gait training ~2109ft, no AD, down to ortho therapy gym including on/off elevator with close supervision for safety - no overt instances of LOB but mild instability noted primarily with quick turns - cuing for upright gaze and posture - pt able to achieve reciprocal stepping with good foot clearance bilaterally - overall decreased gait speed but sufficient. ? ?Simulated ambulatory car transfer (small SUV height), no AD, with close supervision and verbal cuing for sequencing safe transfer. ? ?Gait training ~156ft to main therapy gym, no AD, with continued close supervision and cuing as above. ? ?Stair navigation ascending/descending 4 steps (6" height) x3 using R HR only to simulate home set-up with close supervision for safety - pt self selecting reciprocal pattern in both directions with good balance noted with this. ? ?Pt's son reports no current questions/concerns. Confirms that he will be at home majority of the time to support patient as needed. ? ?Patient participated in [facility] and demonstrates increased fall risk as noted by score of  43/56; however, this is significantly improved compared to 36/56 on 06/16/21.  (<36= high risk for falls, close to 100%; 37-45 significant >80%; 46-51 moderate >50%; 52-55 lower >25%). ? ?Discussed plan for D/C on Friday  4/7 with pt and family in agreement. Therapist educated on recommendation for follow-up OPPT. Educated pt and family on plan to provide pt with HEP to perform with family assistance as needed for safety after D/C. ? ?Gait training ~271ft, no AD, back up to pt's room with close supervision as described above. ? ?Gait in/out bathroom, no AD, with close supervision. Standing with close supervision performed LB clothing management and peri-care with only set-up assist. Pt continent of bowels and bladder. Stand hand hygiene at sink with close supervision for standing balance.  ? ?At end of session, pt left supine in bed with needs in reach, her family present, and bed alarm on. Pt/family report no questions or concerns at this time. ?  ? ? ?Therapy Documentation ?Precautions:  ?Precautions ?Precautions: Fall, Other (comment) ?Precaution Comments: aphasia, R hemiparesis ?Restrictions ?Weight Bearing Restrictions: No ? ? ?Pain: ? Reports muscle soreness in her legs - provided seated rest breaks for pain management. ? ?Balance: ?Standardized Balance Assessment ?Standardized Balance Assessment: Berg Balance Test ?Sharlene Motts Balance Test ?Sit to Stand: Able to stand without using hands and stabilize independently ?Standing Unsupported: Able to stand safely 2 minutes ?Sitting with Back Unsupported but Feet Supported on Floor or Stool: Able to sit safely and securely 2 minutes ?Stand to Sit: Sits safely with minimal use of hands ?Transfers: Able to transfer safely, minor use of hands ?Standing Unsupported with Eyes Closed: Able to stand 10 seconds with supervision ?Standing Ubsupported with Feet Together: Able to place feet together independently and stand for 1 minute with supervision ?From Standing, Reach Forward with Outstretched Arm: Can reach forward >12 cm safely (5") ?  From Standing Position, Pick up Object from Floor: Able to pick up shoe, needs supervision ?From Standing Position, Turn to Look Behind Over each Shoulder: Turn  sideways only but maintains balance ?Turn 360 Degrees: Able to turn 360 degrees safely but slowly ?Standing Unsupported, Alternately Place Feet on Step/Stool: Able to complete 4 steps without aid or supervision ?Standing Unsupported, One Foot in Front: Able to plae foot ahead of the other independently and hold 30 seconds ?Standing on One Leg: Able to lift leg independently and hold equal to or more than 3 seconds ?Total Score: 43 ? ? ?Therapy/Group: Individual Therapy ? ?Ginny Forth , PT, DPT, NCS, CSRS ? ?06/20/2021, 12:58 PM  ?

## 2021-06-20 NOTE — Progress Notes (Signed)
Patient ID: Susan Lewis, female   DOB: June 11, 1960, 61 y.o.   MRN: [id number] ?Team Conference Report to Patient/Family ? ?Team Conference discussion was reviewed with the patient and caregiver, including goals, any changes in plan of care and target discharge date.  Patient and caregiver express understanding and are in agreement.  The patient has a target discharge date of 06/22/21. ? ?SW spoke with patient spouse and provided conference updates. Patient spouse agreeable to transporting patient to OP. No additional questions or concerns. ? ?[name] ?06/20/2021, 2:23 PM  ?

## 2021-06-20 NOTE — Progress Notes (Signed)
Speech Language Pathology Daily Session Note ? ?Patient Details  ?Name: Susan Lewis ?MRN: [id number] ?Date of Birth: [birth year] ? ?Today's Date: 06/20/2021 ?SLP Individual Time: 1303-1400 ?SLP Individual Time Calculation (min): 57 min ? ?Short Term Goals: ?Week 1: SLP Short Term Goal 1 (Week 1): STG=LTG due to short ELOS ? ?Skilled Therapeutic Interventions: Skilled ST treatment focused on language goals. SLP facilitated session by providing further education and instruction on word finding strategies, as well as general aphasia education. Handouts were provided to reinforce discussed topics. Patient verbalized and demonstrated understanding of the following strategies: description, synonyms, association, first letter cue, and gestures with overall min A verbal cues. Pt exhibited less instances of word finding difficulty and improved ability to utilize strategies this date with awareness to errors with overall modified independence. Patient generated words per each letter of the alphabet with sup A visual cues via written cue on dry erase board with overall min A for word finding and repair. Pt is feeling optimistic about language improvements and agreeable to recommendations for follow up speech therapy in outpatient setting. Patient was left in bed with alarm activated and immediate needs within reach at end of session. Continue per current plan of care.   ?   ? ?Pain ?Pain Assessment ?Pain Scale: 0-10 ?Pain Score: 0-No pain ? ?Therapy/Group: Individual Therapy ? ?Arnel Wymer T Arville Postlewaite ?06/20/2021, 4:38 PM ?

## 2021-06-20 NOTE — Progress Notes (Signed)
Occupational Therapy Session Note ? ?Patient Details  ?Name: Susan Lewis ?MRN: [id number] ?Date of Birth: 11/09/1960 ? ?Today's Date: 06/20/2021 ?OT Individual Time: 1607-3710 session 1 ?Session 2: 6269-4854 ( 32 mins) ? ?Short Term Goals: ?Week 1:  OT Short Term Goal 1 (Week 1): Pt will complete LB dressing with supervision ?OT Short Term Goal 2 (Week 1): Pt will complete bathing with supervision ?OT Short Term Goal 3 (Week 1): Pt will demonstrate ability to grasp objects with pads of fingers on R hand to increase ability to open containers and manage clothing ? ?Skilled Therapeutic Interventions/Progress Updates:  ?Session 1: Pt greeted seated EOB agreeable to OT intervention. Session focus on BADL reeducation, functional mobility, dynamic standing balance and decreasing overall caregiver burden.    ?Pt completed functional ambulation from EOB to sink for bathing tasks. Pt standing at sink to wash her face suddenly needing to sit reporting feeling "dizzy/ lightheaded."  ?BP 133/97 ( 107) HR 76 , MD enter during this time reporting plan to order orthostatic VS.  ?Pt completed remainder of grooming tasks from sitting, noted some apraxia when looking for ADL items noted to initially pick up the barrier cream, pt needed MAX cues to verbalize "barrier." Pt able to locate correct toothpaste with increased time. Pt ambulated to bathroom with no AD and supervision no reports of dizziness, pt with +BM. Pt completed 3/3 toileting tasks with supervision - CGA as pt noted to get stool on her R hand d/t incoordination. Donned glove for pt with pt completing osterior pericare with CGA as pt did have one minor LOB during pericare needing light MIN A to regain standing balance, pt ambulates to sink for hand hygiene with supervision, no AD. Pt completed dressing from EOB MOD I. Remainder of session focused on word finding and RUE coordinaton. Pt completed various therapeutic activities with an emphasis on bilateral integration, R UE  in hand manipulation skills such as shifting, rotation and translation with pt instructed to make various words from slips of paper placed on table. Pt also able to collect small chunks of putty with RUE and instructed to count and place each piece in container, pt able to count up to 12 with no cues. Pt also worked on folding towels with BUEs to promote bilateral coordination. Left pt still working on therapeutic activities listed above from EOB. pt left seated EOB with all needs within reach and bed alarm activated.       ? ?Session 2: Pt greeted supine in bed  agreeable to OT intervention. Session focus on functional mobility, dynamic standing balance, RUE coordination, word finding, sequencing and decreasing overall caregiver burden.   ?Pt completed task on BITS where pt instructed to sequence numbers 1-15 using RUE. Pt completed task with 88.25 % accuracy with pt noted to be slowest in R upper quad with a 2.71 sec reaction time. Next pt completed same task but this time with letters A- T. Pt completed task 55.56% accuracy, in  6 mins and 51 secs harder to see in top R quad. Pt also needed up to MAX verbal cues to sequence correct letters with pt needing to close her eyes and run through alphabet song to problem solve next letter. Feel pt was overwhelmed by too many letters on screen. ?Discussed visual deficits with pt reporting cataracts, completed visual assessment with no other impairments noted but do suspect some R inattention after completing multiple activities on the BITS.  Pt ambulated back to room with no AD and  cGA. Pt left supine in bed with bed alarm activated and all needs within reach.                  ?              ?Therapy Documentation ?Precautions:  ?Precautions ?Precautions: Fall, Other (comment) ?Precaution Comments: aphasia, R hemiparesis ?Restrictions ?Weight Bearing Restrictions: No ? ?Pain: ?Session 1: no pain reported during session  ?Session 2: no pain reported during session   ? ?Therapy/Group: Individual Therapy ? ?Barron Schmid ?06/20/2021, 10:47 AM ?

## 2021-06-20 NOTE — Patient Care Conference (Signed)
Inpatient RehabilitationTeam Conference and Plan of Care Update ?Date: 06/20/2021   Time: 10:50 AM  ? ? ?Patient Name: Susan Lewis      ?Medical Record Number: [id number]  ?Date of Birth: 1960/11/25 ?Sex: Female         ?Room/Bed: 5C10C/5C10C-01 ?Payor Info: Payor: [employer] / Plan: [employer] / Product Type: *No Product type* /   ? ?Admit Date/Time:  06/15/2021  5:34 PM ? ?Primary Diagnosis:  Left middle cerebral artery stroke (HCC) ? ?Hospital Problems: Principal Problem: ?  Left middle cerebral artery stroke (HCC) ?Active Problems: ?  Depression with anxiety ?  Bursitis ? ? ? ?Expected Discharge Date: Expected Discharge Date: 06/22/21 ? ?Team Members Present: ?Physician leading conference: Dr. Claudette Laws ?Social Worker Present: Lavera Guise, BSW ?Nurse Present: Chana Bode, RN ?PT Present: Casimiro Needle, PT ?OT Present: Barron Schmid, COTA ?SLP Present: Eilene Ghazi, SLP ?PPS Coordinator present : Fae Pippin, SLP ? ?   Current Status/Progress Goal Weekly Team Focus  ?Bowel/Bladder ? ? Continent of bowel/bladder with assist of 1 to bathroom: Last BM 06/17/21.  remain continent  Continue to asssist pt with toileting needs.   ?Swallow/Nutrition/ Hydration ? ?           ?ADL's ? ? pt completes ambulatory shower transfer with no AD and MIN A, set- up for UB dressing, MIN A for LB dressing, MIN A for 3/3 toileting tasks, and MIN A for ambulatory toilet transfer with no AD, RUE apraxia impacting safety with ADLs, noted impaired safety awareness duirng Bathing tasks  supervision- MOD I  RUE coordination, functional mobility, dynamic standing balance   ?Mobility ? ? supervision bed mobility, CGA sit<>stand and stand pivot transfers without AD, CGA/light min assist gait >1,062ft without AD, CGA 12 stair navigation using B HRs - possible R inattention as well as possible visual impairments  supervision overall at ambulatory level  activity tolerance, pt/family education, transfer training,  gait training, dynamic standing balance, R attention and R hemibody NMR, stair navigation training   ?Communication ? ? mod A  min A  word finding strategies, overcoming communcation breakdowns following 2-3 step commands   ?Safety/Cognition/ Behavioral Observations ? min A  sup A  further cognitive testing, emergent awareness   ?Pain ? ? Pt verbalized good pain controll with scheduled Tramadol, prn tylenol, ice to right hip, pelvis thigh.  maintain good pain control.  assess pain qshift, & prn   ?Skin ? ? skin intact, Large area of purple bruising noted to right upper thigh, groin, peri area  maintain intact skin with repositioning, hydration, lotion  monitor sight of bruising routinely qshift, & prn, document accordingly   ? ? ?Discharge Planning:  ?Discharging home with spouse and son able to provide 24/7.   ?Team Discussion: ?Patient post M2 occlusion with aphasia; tobacco + hx and anxiety.  Progress limited by apraxia of right upper extremity, right inattention, word finding and visual deficits.  ?Patient on target to meet rehab goals: ?yes, currently needs supervision for ADLs, and toileting with CGA. Completes sit- stand without an assistive device with CGA and able to ambulate over 1000' and manage 12 steps with CGA. Communicates with mod assist for strategies and min assisst for language. Goals for discharge set for min assist for SLP and supervision overall.  ? ?*See Care Plan and progress notes for long and short-term goals.  ? ?Revisions to Treatment Plan:  ?N/A  ?Teaching Needs: ?Safety/strategies, medication management, dietary modifications, transfers, etc  ?  Current Barriers to Discharge: ?Decreased caregiver support ? ?Possible Resolutions to Barriers: ?Family education ?OP follow up services ?DME: TTB ?  ? ? Medical Summary ?Current Status: RUE apraxia, exp aphasia, mild anemia ? Barriers to Discharge: Other (comments) ? Barriers to Discharge Comments: aphasia and apraxia impacting on therapy  progress ?Possible Resolutions to [employer] Focus: eval for possible R neglect, caregiver training ? ? ?Continued Need for Acute Rehabilitation Level of Care: The patient requires daily medical management by a physician with specialized training in physical medicine and rehabilitation for the following reasons: ?Direction of a multidisciplinary physical rehabilitation program to maximize functional independence : Yes ?Medical management of patient stability for increased activity during participation in an intensive rehabilitation regime.: Yes ?Analysis of laboratory values and/or radiology reports with any subsequent need for medication adjustment and/or medical intervention. : Yes ? ? ?I attest that I was present, lead the team conference, and concur with the assessment and plan of the team. ? ? ?Chana Bode B ?06/20/2021, 2:27 PM  ? ? ? ? ? ? ?

## 2021-06-21 ENCOUNTER — Other Ambulatory Visit: Payer: Self-pay | Admitting: Physical Medicine & Rehabilitation

## 2021-06-21 MED ORDER — OMEPRAZOLE 40 MG PO CPDR: 40.0000 mg | DELAYED_RELEASE_CAPSULE | Freq: Every day | ORAL | 0 refills | Status: DC

## 2021-06-21 MED ORDER — ACETAMINOPHEN 325 MG PO TABS: 650.0000 mg | ORAL_TABLET | ORAL | Status: AC | PRN

## 2021-06-21 MED ORDER — HYDROXYZINE HCL 25 MG PO TABS: 25.0000 mg | ORAL_TABLET | Freq: Three times a day (TID) | ORAL | 0 refills | Status: DC | PRN

## 2021-06-21 MED ORDER — GABAPENTIN 300 MG PO CAPS: 300.0000 mg | ORAL_CAPSULE | Freq: Two times a day (BID) | ORAL | 3 refills | Status: DC

## 2021-06-21 MED ORDER — AMLODIPINE BESYLATE 5 MG PO TABS: 5.0000 mg | ORAL_TABLET | Freq: Every day | ORAL | 0 refills | Status: DC

## 2021-06-21 MED ORDER — CALCIUM CARBONATE ANTACID 500 MG PO CHEW: 200.0000 mg | CHEWABLE_TABLET | Freq: Three times a day (TID) | ORAL | Status: AC

## 2021-06-21 MED ORDER — SOLIFENACIN SUCCINATE 5 MG PO TABS: 5.0000 mg | ORAL_TABLET | Freq: Every day | ORAL | 0 refills | Status: DC

## 2021-06-21 MED ORDER — ATORVASTATIN CALCIUM 20 MG PO TABS: 20.0000 mg | ORAL_TABLET | Freq: Every day | ORAL | 0 refills | Status: DC

## 2021-06-21 MED ORDER — TICAGRELOR 90 MG PO TABS: 90.0000 mg | ORAL_TABLET | Freq: Two times a day (BID) | ORAL | 0 refills | Status: AC

## 2021-06-21 MED ORDER — OXYBUTYNIN CHLORIDE 5 MG PO TABS: 5.0000 mg | ORAL_TABLET | Freq: Three times a day (TID) | ORAL | 2 refills | Status: DC

## 2021-06-21 MED ORDER — BUSPIRONE HCL 15 MG PO TABS: 15.0000 mg | ORAL_TABLET | Freq: Two times a day (BID) | ORAL | 5 refills | Status: DC

## 2021-06-21 MED ORDER — TRAMADOL HCL 50 MG PO TABS: 50.0000 mg | ORAL_TABLET | Freq: Two times a day (BID) | ORAL | 0 refills | Status: DC | PRN

## 2021-06-21 MED ORDER — TRAZODONE HCL 150 MG PO TABS: 150.0000 mg | ORAL_TABLET | Freq: Every day | ORAL | 0 refills | Status: DC

## 2021-06-21 MED ORDER — ALBUTEROL SULFATE HFA 108 (90 BASE) MCG/ACT IN AERS: INHALATION_SPRAY | RESPIRATORY_TRACT | 0 refills | Status: AC

## 2021-06-21 MED ORDER — SERTRALINE HCL 100 MG PO TABS
150.0000 mg | ORAL_TABLET | Freq: Every day | ORAL | 0 refills | Status: DC
Start: 2021-06-21 — End: 2021-07-23

## 2021-06-21 NOTE — Progress Notes (Addendum)
Inpatient Rehabilitation Discharge Medication Review by a Pharmacist ? ?A complete drug regimen review was completed for this patient to identify any potential clinically significant medication issues. ? ?High Risk Drug Classes Is patient taking? Indication by Medication  ?Antipsychotic No   ?Anticoagulant No   ?Antibiotic No   ?Opioid Yes Tramadol - acute pain  ?Antiplatelet Yes ASA, Brilinta - s/p CVA stent  ?Hypoglycemics/insulin No   ?Vasoactive Medication Yes Norvasc - HTN  ?Chemotherapy No   ?Other Yes Ditropan, Vesicare - OAB ?Calcium carbonate, Prilosec - GERD ?Zoloft, Buspar, hydroxyzine - anxiety ?Lipitor - HLD ?Trazodone - sleep ?Gabapentin - neuropathic pain ?Flonase, Astelin - allergies ?Ventolin HFA - COPD  ? ? ? ?Type of Medication Issue Identified Description of Issue Recommendation(s)  ?Drug Interaction(s) (clinically significant) ?    ?Duplicate Therapy ?    ?Allergy ?    ?No Medication Administration End Date ?    ?Incorrect Dose ?    ?Additional Drug Therapy Needed ?    ?Significant med changes from prior encounter (inform family/care partners about these prior to discharge).    ?Other ?    ? ? ?Clinically significant medication issues were identified that warrant physician communication and completion of prescribed/recommended actions by midnight of the next day:  No ? ?Time spent performing this drug regimen review (minutes):  30 ? ? ?Valentino Saxon Anastasov ?06/21/2021 10:20 AM ? ?Denay Pleitez BS, PharmD, BCPS ?Clinical Pharmacist ?06/22/2021 6:56 AM ?

## 2021-06-21 NOTE — Progress Notes (Signed)
Occupational Therapy Discharge Summary ? ?Patient Details  ?Name: Susan Lewis ?MRN: [id number] ?Date of Birth: 1960-12-25 ? ?Today's Date: 06/21/2021 ? ?Patient has met 8 of 8 long term goals due to improved activity tolerance, improved balance, postural control, and ability to compensate for deficits.  Pt has made great progress d/t LOS d/t improved balance, RUE motor planning/ coordination and overall strength and endurance. Pt does continue to present with impaired RUE [facility] and expressive aphasia however pt completes ambulatory ADLs transfers with no AD and completes ADLs with overall MOD I, supervision needed for shower transfer to tub for increased safety awareness. Pts son and husband were present for family training, both verbalized understanding of education. Patient to discharge at overall Modified Independent level.  Patient's care partner is independent to provide the necessary physical assistance at discharge.   ? ?Reasons goals not met: NA ? ?Recommendation:  ?Patient will benefit from ongoing skilled OT services in outpatient setting to continue to advance functional skills in the area of BADL and Reduce care partner burden. ? ?Equipment: ?TTB ? ?Reasons for discharge: treatment goals met and discharge from hospital ? ?Patient/family agrees with progress made and goals achieved: Yes ? ?OT Discharge ? ?Vital Signs ?Therapy Vitals ?Temp: 98.2 ?F (36.8 ?C) ?Pulse Rate: 63 ?Resp: 17 ?BP: 113/64 ?Patient Position (if appropriate): Lying ?Oxygen Therapy ?SpO2: 100 % ?O2 Device: Room Air ? ?  ?ADL ?ADL ?Eating: Modified independent ?Where Assessed-Eating: Chair ?Grooming: Modified independent ?Where Assessed-Grooming: Sitting at sink ?Upper Body Bathing: Modified independent ?Where Assessed-Upper Body Bathing: Shower, Other (Comment) (sitting in shower) ?Lower Body Bathing: Modified independent ?Where Assessed-Lower Body Bathing: Shower, Other (Comment) (sitting in shower) ?Upper Body Dressing: Modified  independent (Device) ?Where Assessed-Upper Body Dressing: Edge of bed ?Lower Body Dressing: Modified independent ?Where Assessed-Lower Body Dressing: Edge of bed ?Toileting: Modified independent ?Where Assessed-Toileting: Toilet, Bedside Commode (BSC over toilet) ?Toilet Transfer: Modified independent ?Toilet Transfer Method: Ambulating ?Toilet Transfer Equipment: Bedside commode ?Tub/Shower Transfer: Close supervison ?Tub/Shower Transfer Method: Ambulating ?Tub/Shower Equipment: Radio broadcast assistant ?ADL Comments: mod I - supervision for ADLs for safety awareness ?Vision ?Baseline Vision/History: 1 Wears glasses;4 Cataracts (cataracts in both eyes baseline) ?Patient Visual Report: No change from baseline ?Mild R inattention  ?Perception  ?Perception: Impaired ?Inattention/Neglect: Does not attend to right visual field;Does not attend to right side of body (slight R inattention noticed) ?Praxis ?Praxis: Impaired ?Praxis Impairment Details: Motor planning ?Praxis-Other Comments: R UE ?Cognition ?Cognition ?Overall Cognitive Status: Difficult to assess (d/t language deficits) ?Arousal/Alertness: Awake/alert ?Orientation Level: Person;Place;Situation ?Person: Oriented ?Place: Oriented ?Situation: Oriented ?Memory: Impaired ?Memory Impairment: Decreased recall of new information;Decreased short term memory ?Decreased Short Term Memory: Verbal basic;Verbal complex ?Attention: Selective ?Focused Attention: Appears intact ?Sustained Attention: Appears intact ?Selective Attention: Appears intact ?Awareness: Impaired ?Awareness Impairment: Emergent impairment;Anticipatory impairment ?Problem Solving: Impaired ?Problem Solving Impairment: Verbal complex;Functional complex ?Safety/Judgment: Appears intact ?Brief Interview for Mental Status (BIMS) ?Repetition of Three Words (First Attempt): 3 ?Temporal Orientation: Year: Correct ?Temporal Orientation: Month: Missed by more than 1 month ?Temporal Orientation: Day: Correct ?Recall:  "Sock": Yes, no cue required ?Recall: "Blue": Yes, after cueing ("a color") ?Recall: "Bed": Yes, no cue required ?BIMS Summary Score: 12 ?Sensation ?Sensation ?Light Touch: Appears Intact ?Hot/Cold: Appears Intact ?Motor  ?Motor ?Motor: Motor apraxia ?Motor - Discharge Observations: slight R UE apraxia with slight R inattention ?Mobility  ?Bed Mobility ?Bed Mobility: Supine to Sit;Sit to Supine ?Supine to Sit: Independent with assistive device ?Sit to Supine: Independent with assistive device ?Transfers ?Sit  to Stand: Independent ?Stand to Sit: Independent  ?Trunk/Postural Assessment  ?Cervical Assessment ?Cervical Assessment: Within Functional Limits ?Thoracic Assessment ?Thoracic Assessment: Exceptions to [facility] (mild thoraic rounding) ?Lumbar Assessment ?Lumbar Assessment: Within Functional Limits ?Postural Control ?Postural Control: Within Functional Limits  ?Balance ?Balance ?Balance Assessed: Yes ?Standardized Balance Assessment ?Standardized Balance Assessment: Berg Balance Test ?Merrilee Jansky Balance Test ?Sit to Stand: Able to stand without using hands and stabilize independently ?Standing Unsupported: Able to stand safely 2 minutes ?Sitting with Back Unsupported but Feet Supported on Floor or Stool: Able to sit safely and securely 2 minutes ?Stand to Sit: Sits safely with minimal use of hands ?Transfers: Able to transfer safely, minor use of hands ?Standing Unsupported with Eyes Closed: Able to stand 10 seconds with supervision ?Standing Ubsupported with Feet Together: Able to place feet together independently and stand for 1 minute with supervision ?From Standing, Reach Forward with Outstretched Arm: Can reach forward >12 cm safely (5") ?From Standing Position, Pick up Object from Floor: Able to pick up shoe, needs supervision ?From Standing Position, Turn to Look Behind Over each Shoulder: Turn sideways only but maintains balance ?Turn 360 Degrees: Able to turn 360 degrees safely but slowly ?Standing Unsupported,  Alternately Place Feet on Step/Stool: Able to complete 4 steps without aid or supervision ?Standing Unsupported, One Foot in Front: Able to plae foot ahead of the other independently and hold 30 seconds ?Standing on One Leg: Able to lift leg independently and hold equal to or more than 3 seconds ?Total Score: 43 ?Static Sitting Balance ?Static Sitting - Balance Support: Feet supported ?Static Sitting - Level of Assistance: 6: Modified independent (Device/Increase time) ?Dynamic Sitting Balance ?Dynamic Sitting - Balance Support: Feet supported ?Dynamic Sitting - Level of Assistance: 6: Modified independent (Device/Increase time) ?Static Standing Balance ?Static Standing - Balance Support: During functional activity ?Static Standing - Level of Assistance: 7: Independent;6: Modified independent (Device/Increase time) ?Dynamic Standing Balance ?Dynamic Standing - Balance Support: During functional activity ?Dynamic Standing - Level of Assistance: 5: Stand by assistance ?Dynamic Standing - Balance Activities: Lateral lean/weight shifting;Reaching across midline;Forward lean/weight shifting ?Extremity/Trunk Assessment ?RUE Assessment ?RUE Assessment: Within Functional Limits ?Active Range of Motion (AROM) Comments: WFL ?General Strength Comments: 4/5 mmt ?LUE Assessment ?LUE Assessment: Within Functional Limits ?General Strength Comments: 4/5 mmt ? ? ?Precious Haws ?06/21/2021, 7:40 AM ?

## 2021-06-21 NOTE — Discharge Summary (Signed)
Physical Therapy Discharge Summary ? ?Patient Details  ?Name: Susan Lewis ?MRN: [id number] ?Date of Birth: 1960-04-06 ? ?Today's Date: 06/21/2021 ?PT Individual Time: 4627-0350 ?PT Individual Time Calculation (min): 15 min  ?and ?Today's Date: 06/21/2021 ?PT Missed Time: 30 Minutes ?Missed Time Reason: Patient fatigue ? ? ?Patient has met 9 of 9 long term goals due to improved activity tolerance, improved balance, improved postural control, ability to compensate for deficits, functional use of  right upper extremity and right lower extremity, improved attention, improved awareness, and improved coordination.  Patient to discharge at an ambulatory level Supervision without AD. Patient's care partner is independent to provide the necessary physical and cognitive assistance at discharge. ? ?All goals met. ? ?Recommendation:  ?Patient will benefit from ongoing skilled PT services in outpatient setting to continue to advance safe functional mobility, address ongoing impairments in higher level dynamic standing balance, higher level gait training, dual-task training, and minimize fall risk. ? ?Equipment: ?No equipment provided - none needed ? ?Reasons for discharge: treatment goals met and discharge from hospital ? ?Patient/family agrees with progress made and goals achieved: Yes ? ?Skilled Therapeutic Interventions/Progress Updates:  ?Pt received supine in bed reporting feeling "so tired." Pt appears fatigued as well. Pt states she is "nervous...but not in a bad way" about discharging home tomorrow. Pt denies any concerns about her functional mobility. Pt politely declines participation in therapy at this time, but agreeable for therapist to return as able. ? ?Therapist returned to pt's room where she was resting in the bed. Pt agreeable to education but declines participating in OOB activity. Pt states she is nervous about going home and getting back in her normal routine - thearpist encouraged her to have family support her  as she is not yet able to go back to her old "normal."  Pt's son, Gaspar Bidding, demonstrated willingness to support pt upon discharge and was present for education yesterday and demonstrated/verbalized understanding of education. Therapist provided pt with emotional support and reinforced previous fall risk education. Therapist provided pt with stroke support group flyer.  ? ?Provided pt with the below HEP; however, pt declining participating in it at this time. Therapist educated pt on having her son assist her with the exercises - pt in agreement.  ?Access Code: [email or web address] ?URL: [email or web address] ?Date: 06/21/2021 ?Prepared by: Page Spiro ? ?Exercises ?- Supine Bridge  - 1 x daily - 7 x weekly - 2 sets - 15 reps ?- Walking  - 1 x daily - 7 x weekly - 2 sets - 150 feet hold ?- Sit to Stand with Counter Support  - 1 x daily - 7 x weekly - 2 sets - 10 reps ?- Side Stepping with Counter Support  - 1 x daily - 7 x weekly - 2 sets - 15 reps ?- Standing Tandem Balance with Counter Support  - 1 x daily - 7 x weekly - 2 sets - 30 seconds hold ?- Forward Step Up with Counter Support  - 1 x daily - 7 x weekly - 2 sets - 10 reps ? ?Pt reports no further questions/concerns from therapist about D/C. Pt left in bed with needs in reach and bed alarm on. Missed 30 minutes of skilled physical therapy. ? ?PT Discharge ?Precautions/Restrictions ?Precautions ?Precautions: Fall;Other (comment) ?Precaution Comments: aphasia, mild R hemiparesis ?Restrictions ?Weight Bearing Restrictions: No ?Pain ?Pain Assessment ?Pain Scale: 0-10 ?Pain Score: 0-No pain ?Pain Interference ?Pain Interference ?Pain Effect on Sleep: 1. Rarely or not at all ?Pain Interference with Therapy Activities:  1. Rarely or not at all ?Pain Interference with Day-to-Day Activities: 1. Rarely or not at all ?Vision/Perception  ?Vision - History ?Ability to See in Adequate Light: 0 Adequate ?Perception ?Perception: Impaired ?Inattention/Neglect: Does not attend  to right visual field;Does not attend to right side of body (slight R inattention noticed) ?Praxis ?Praxis: Impaired ?Praxis Impairment Details: Motor planning ?Praxis-Other Comments: R UE  ?Cognition ?Overall Cognitive Status: Difficult to assess (due to expressive aphasia) ?Arousal/Alertness: Awake/alert ?Orientation Level: Oriented X4 ?Year: 2023 ?Month: April ?Day of Week: Correct ?Attention: Focused;Sustained ?Focused Attention: Appears intact ?Sustained Attention: Appears intact ?Selective Attention: Appears intact ?Memory: Impaired ?Problem Solving: Impaired ?Safety/Judgment: Appears intact ?Sensation ?Sensation ?Light Touch: Appears Intact ?Hot/Cold: Not tested ?Proprioception: Appears Intact ?Stereognosis: Not tested ?Coordination ?Gross Motor Movements are Fluid and Coordinated: Yes (in LEs) ?Coordination and Movement Description: still with slight R UE apraxia ?Motor  ?Motor ?Motor: Motor apraxia ?Motor - Discharge Observations: slight R UE apraxia with slight R inattention  ?Mobility ?Bed Mobility ?Bed Mobility: Supine to Sit;Sit to Supine ?Supine to Sit: Independent with assistive device ?Sit to Supine: Independent with assistive device ?Transfers ?Transfers: Sit to Stand;Stand to [employer] Transfers ?Sit to Stand: Independent ?Stand to Sit: Independent ?Stand Pivot Transfers: Supervision/Verbal cueing ?Stand Pivot Transfer Details: Verbal cues for precautions/safety ?Transfer (Assistive device): None ?Locomotion  ?Gait ?Ambulation: Yes ?Gait Assistance: Supervision/Verbal cueing ?Gait Distance (Feet): 1000 Feet ?Assistive device: None ?Gait Assistance Details: Verbal cues for precautions/safety ?Gait ?Gait: Yes ?Gait Pattern: Impaired ?Gait Pattern: Step-through pattern (still with slight trunk and UE guarded posturing with downward gaze though improving) ?Gait velocity: decreased though improved ?Stairs / Additional Locomotion ?Stairs: Yes ?Stairs Assistance: Supervision/Verbal cueing ?Stair  Management Technique: One rail Right;Alternating pattern ?Number of Stairs: 12 ?Height of Stairs: 6 ?Ramp: Supervision/Verbal cueing ?Curb: Contact Guard/Touching assist ?Wheelchair Mobility ?Wheelchair Mobility: No  ?Trunk/Postural Assessment  ?Cervical Assessment ?Cervical Assessment: Within Functional Limits ?Thoracic Assessment ?Thoracic Assessment: Exceptions to [facility] (mild thoracic rounding) ?Lumbar Assessment ?Lumbar Assessment: Within Functional Limits ?Postural Control ?Postural Control: Within Functional Limits  ?Balance ?Balance ?Balance Assessed: Yes ?Standardized Balance Assessment ?Standardized Balance Assessment: Berg Balance Test ?Merrilee Jansky Balance Test ?Sit to Stand: Able to stand without using hands and stabilize independently ?Standing Unsupported: Able to stand safely 2 minutes ?Sitting with Back Unsupported but Feet Supported on Floor or Stool: Able to sit safely and securely 2 minutes ?Stand to Sit: Sits safely with minimal use of hands ?Transfers: Able to transfer safely, minor use of hands ?Standing Unsupported with Eyes Closed: Able to stand 10 seconds with supervision ?Standing Ubsupported with Feet Together: Able to place feet together independently and stand for 1 minute with supervision ?From Standing, Reach Forward with Outstretched Arm: Can reach forward >12 cm safely (5") ?From Standing Position, Pick up Object from Floor: Able to pick up shoe, needs supervision ?From Standing Position, Turn to Look Behind Over each Shoulder: Turn sideways only but maintains balance ?Turn 360 Degrees: Able to turn 360 degrees safely but slowly ?Standing Unsupported, Alternately Place Feet on Step/Stool: Able to complete 4 steps without aid or supervision ?Standing Unsupported, One Foot in Front: Able to plae foot ahead of the other independently and hold 30 seconds ?Standing on One Leg: Able to lift leg independently and hold equal to or more than 3 seconds ?Total Score: 43 ?Static Sitting Balance ?Static  Sitting - Balance Support: Feet supported ?Static Sitting - Level of Assistance: 6: Modified independent (Device/Increase time) ?Dynamic Sitting Balance ?Dynamic Sitting - Balance Support: Feet  supported ?

## 2021-06-21 NOTE — Progress Notes (Signed)
Occupational Therapy Session Note ? ?Patient Details  ?Name: Susan Lewis ?MRN: [id number] ?Date of Birth: 10/01/1960 ? ?Today's Date: 06/21/2021 ?OT Individual Time: 1448-1856 ?OT Individual Time Calculation (min): 83 min  ? ? ?Short Term Goals: ?Week 1:  OT Short Term Goal 1 (Week 1): Pt will complete LB dressing with supervision ?OT Short Term Goal 2 (Week 1): Pt will complete bathing with supervision ?OT Short Term Goal 3 (Week 1): Pt will demonstrate ability to grasp objects with pads of fingers on R hand to increase ability to open containers and manage clothing ? ?Skilled Therapeutic Interventions/Progress Updates:  ?Pt greeted seated EOB with family present for family ed, pt agreeable to OT intervention. Session focus on BADL reeducation, functional mobility, RUE FMC,  dynamic standing balance, family training  and decreasing overall caregiver burden.            ?Pt completed ambulatory shower transfer with no AD mod I. Pt completed walk in shower transfer to TTB with supervision. Pt completed alll bathing from shower seat mod I. Pt donned clothes in bathroom mod I. Pt ambulated to sink mod I to complete oral care.education provided to family ( mostly son as husband noted to be dozing off during session) on pts current level of assist recommendation of MODI with recommendation of having supervision for shower transfers and bathing tasks d/t impaired safety awareness and mild R inattention. Education provided on monitoring pt for safety risks d/t mild motor apraxia during ADLS, son verbalized understanding.  ?Pt ambulated down to tub room MOD I with pt completing tub shower transfer to TTB with supervision, son and husband present verbalizing understanding of shower transfer.  ?Remainder of session focus on RUE [facility] HEP for home. Pt completed 9 hole peg test with results as indicated below:  ?RUE 2 mins 48 secs ?LUE 35 secs ?Reviewed HEP with therex including: ?X10 composite digit flexion/extension ?X10  digit lifts ?X10 digit spreading ?X10 pen hurdles ?X10 opposition   ?Putty HEP also provided, son and pt verbalized understanding of HEP.      pt left seated EOB with all needs within reach.                 ? ? ?Therapy Documentation ?Precautions:  ?Precautions ?Precautions: Fall, Other (comment) ?Precaution Comments: aphasia, mild R hemiparesis ?Restrictions ?Weight Bearing Restrictions: No ? ?Pain: ?No pain reported during session  ? ? ?Therapy/Group: Individual Therapy ? ?Barron Schmid ?06/21/2021, 12:47 PM ?

## 2021-06-21 NOTE — Progress Notes (Signed)
?                                                       PROGRESS NOTE ? ? ?Subjective/Complaints: ?Discussed d/c date ? ?ROS: Patient denies CP, SOB, N/V/D ? ? ?Objective: ?  ?No results found. ?Recent Labs  ?  06/20/21 ?0507  ?WBC 7.3  ?HGB 10.8*  ?HCT 33.2*  ?PLT 293  ? ? ?No results for input(s): NA, K, CL, CO2, GLUCOSE, BUN, CREATININE, CALCIUM in the last 72 hours. ? ? ?Intake/Output Summary (Last 24 hours) at 06/21/2021 0826 ?Last data filed at 06/21/2021 0753 ?Gross per 24 hour  ?Intake 480 ml  ?Output --  ?Net 480 ml  ? ?  ? ?  ? ?Physical Exam: ?Vital Signs ?Blood pressure 113/64, pulse 63, temperature 98.2 ?F (36.8 ?C), resp. rate 17, height 5\' 5"  (1.651 m), weight 57.5 kg, SpO2 100 %. ? ?General: No acute distress ?Mood and affect are appropriate ?Heart: Regular rate and rhythm no rubs murmurs or extra sounds ?Lungs: Clear to auscultation, breathing unlabored, no rales or wheezes ?Abdomen: Positive bowel sounds, soft nontender to palpation, nondistended ?Extremities: No clubbing, cyanosis, or edema ?Skin: No evidence of breakdown, no evidence of rash ? ?Skin: ecchymosis infra inguinal, pubic an dmedial thigh on right mildly tender no, no thrill right fem art ?Neurologic: speech slurred, delayed. Has word finding deficits. , motor strength is 5/5 in Left and 4/5 right  deltoid, bicep, tricep, grip, hip flexor, knee extensors, ankle dorsiflexor and plantar flexor. Good standing and ambulatory balance ?Sensory exam normal sensation to light touch in LEs reduced RUE  ?Cerebellar exam normal finger to nose to finger  left upper , moderate dysmetria , RIght upper ext ?Musculoskeletal: Full range of motion in all 4 extremities. No joint swelling ? ? ?Assessment/Plan: ?1. Functional deficits which require 3+ hours per day of interdisciplinary therapy in a comprehensive inpatient rehab setting. ?Physiatrist is providing close team supervision and 24 hour management of active medical problems listed  below. ?Physiatrist and rehab team continue to assess barriers to discharge/monitor patient progress toward functional and medical goals ? ?Care Tool: ? ?Bathing ?   ?Body parts bathed by patient: Right arm, Left lower leg, Left arm, Face, Chest, Abdomen, Front perineal area, Right upper leg, Right lower leg, Left upper leg, Buttocks  ? Body parts bathed by helper: Buttocks ?  ?  ?Bathing assist Assist Level: Minimal Assistance - Patient > 75% ?  ?  ?Upper Body Dressing/Undressing ?Upper body dressing   ?What is the patient wearing?: Pull over shirt ?   ?Upper body assist Assist Level: Independent with assistive device ?   ?Lower Body Dressing/Undressing ?Lower body dressing ? ? ?   ?What is the patient wearing?: Underwear/pull up, Pants ? ?  ? ?Lower body assist Assist for lower body dressing: Independent with assitive device ?   ? ?Toileting ?Toileting    ?Toileting assist Assist for toileting: Contact Guard/Touching assist ?  ?  ?Transfers ?Chair/bed transfer ? ?Transfers assist ?   ? ?Chair/bed transfer assist level: Supervision/Verbal cueing ?  ?  ?Locomotion ?Ambulation ? ? ?Ambulation assist ? ?   ? ?Assist level: Supervision/Verbal cueing ?Assistive device: No Device ?Max distance: 23ft  ? ?Walk 10 feet activity ? ? ?Assist ?   ? ?Assist level: Minimal Assistance -  Patient > 75% ?Assistive device: No Device  ? ?Walk 50 feet activity ? ? ?Assist   ? ?Assist level: Minimal Assistance - Patient > 75% ?Assistive device: No Device  ? ? ?Walk 150 feet activity ? ? ?Assist   ? ?Assist level: Minimal Assistance - Patient > 75% ?Assistive device: No Device ?  ? ?Walk 10 feet on uneven surface  ?activity ? ? ?Assist   ? ? ?Assist level: Minimal Assistance - Patient > 75% ?   ? ?Wheelchair ? ? ? ? ?Assist Is the patient using a wheelchair?: No ?  ?  ? ?  ?   ? ? ?Wheelchair 50 feet with 2 turns activity ? ? ? ?Assist ? ?  ?  ? ? ?   ? ?Wheelchair 150 feet activity  ? ? ? ?Assist ?   ? ? ?   ? ?Blood pressure 113/64,  pulse 63, temperature 98.2 ?F (36.8 ?C), resp. rate 17, height 5\' 5"  (1.651 m), weight 57.5 kg, SpO2 100 %. ? ?Medical Problem List and Plan: ?1. 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            -patient may shower ?            -ELOS/Goals: 4/7  mod I to supervision with PT, OT, SLP ? -Continue CIR therapies including PT, OT, and SLP -  ?2.  Antithrombotics: ?-DVT/anticoagulation:  Pharmaceutical: Lovenox ?            -antiplatelet therapy: Aspirin 81 mg daily and Brilinta 90 mg twice daily x6 months then aspirin alone ?3. Pain Management: Tylenol as needed ?4. Mood: BuSpar 15 mg twice daily, Zoloft 150 mg daily, trazodone 150 mg nightly Atarax as needed anxiety ?            -antipsychotic agents: N/A ?5. Neuropsych: This patient is capable of making decisions on her own behalf. ?6. Skin/Wound Care: Routine skin checks, extensive Right groin , pubic and medial thigh bruising at femoral sheath insertion site  ?7. Fluids/Electrolytes/Nutrition: encourage PO ? 4/3 I personally reviewed the patient's labs today.   ?8.  Hyperlipidemia.  Lipitor ?9.  Hypertension.  Norvasc 5 mg daily.  Monitor with increased mobility ?Vitals:  ? 06/20/21 1921 06/21/21 0408  ?BP: 128/69 113/64  ?Pulse: 63 63  ?Resp: 17 17  ?Temp: 98.8 ?F (37.1 ?C) 98.2 ?F (36.8 ?C)  ?SpO2: 98% 100%  ?  4/6 controlled ?10.  History of tobacco as well as marijuana use.  Provide counseling ?11.  GERD.  Prilosec ?12. Anemia: hgb down to 9.9 today (has fluctuated honestly) ? -likely related to bruising/hematoma a fem sheath insert site ? -check stool for OB ? -recheck labs Wednesday ?  ?13.  Dropping objects R hand due to sensory problems related to CVA discussed with pt ?LOS: ?6 days ?A FACE TO FACE EVALUATION WAS PERFORMED ? ?Friday Mikel Hardgrove ?06/21/2021, 8:26 AM  ? ? ? ?

## 2021-06-22 LAB — CREATININE, SERUM
Creatinine, Ser: 1.02 mg/dL — ABNORMAL HIGH (ref 0.44–1.00)
GFR, Estimated: >60 mL/min (ref >60)

## 2021-06-22 NOTE — Progress Notes (Signed)
?                                                       PROGRESS NOTE ? ? ?Subjective/Complaints: ? ?Discussed no driving , pt worked FT as Database administrator ,not sure if she plans to try going back  ? ?ROS: Patient denies CP, SOB, N/V/D ? ? ?Objective: ?  ?No results found. ?Recent Labs  ?  06/20/21 ?0507  ?WBC 7.3  ?HGB 10.8*  ?HCT 33.2*  ?PLT 293  ? ? ?Recent Labs  ?  06/22/21 ?0435  ?CREATININE 1.02*  ? ? ? ?Intake/Output Summary (Last 24 hours) at 06/22/2021 0847 ?Last data filed at 06/22/2021 0700 ?Gross per 24 hour  ?Intake 960 ml  ?Output --  ?Net 960 ml  ? ?  ? ?  ? ?Physical Exam: ?Vital Signs ?Blood pressure 139/75, pulse 66, temperature 98.2 ?F (36.8 ?C), temperature source Oral, resp. rate 17, height 5\' 5"  (1.651 m), weight 57.5 kg, SpO2 99 %. ? ?General: No acute distress ?Mood and affect are appropriate ?Heart: Regular rate and rhythm no rubs murmurs or extra sounds ?Lungs: Clear to auscultation, breathing unlabored, no rales or wheezes ?Abdomen: Positive bowel sounds, soft nontender to palpation, nondistended ?Extremities: No clubbing, cyanosis, or edema ?Skin: No evidence of breakdown, no evidence of rash ? ?Skin: ecchymosis infra inguinal, pubic an dmedial thigh on right mildly tender no, no thrill right fem art ?Neurologic: speech slurred, delayed. Has word finding deficits. , motor strength is 5/5 in Left and 4/5 right  deltoid, bicep, tricep, grip, hip flexor, knee extensors, ankle dorsiflexor and plantar flexor. Good standing and ambulatory balance ?Sensory exam normal sensation to light touch in LEs reduced RUE  ?Cerebellar exam normal finger to nose to finger  left upper , moderate dysmetria , RIght upper ext ?Musculoskeletal: Full range of motion in all 4 extremities. No joint swelling ? ? ?Assessment/Plan: ?1. Functional deficits  ?Stable for D/C today ?F/u PCP in 3-4 weeks ?F/u PM&R 2 weeks ?See D/C summary ?See D/C instructions ? ?Care Tool: ? ?Bathing ?   ?Body parts bathed by patient: Right arm,  Left lower leg, Left arm, Face, Chest, Abdomen, Front perineal area, Right upper leg, Right lower leg, Left upper leg, Buttocks  ? Body parts bathed by helper: Buttocks ?  ?  ?Bathing assist Assist Level: Independent with assistive device ?  ?  ?Upper Body Dressing/Undressing ?Upper body dressing   ?What is the patient wearing?: Pull over shirt ?   ?Upper body assist Assist Level: Independent with assistive device ?   ?Lower Body Dressing/Undressing ?Lower body dressing ? ? ?   ?What is the patient wearing?: Underwear/pull up, Pants ? ?  ? ?Lower body assist Assist for lower body dressing: Independent with assitive device ?   ? ?Toileting ?Toileting    ?Toileting assist Assist for toileting: Independent with assistive device ?  ?  ?Transfers ?Chair/bed transfer ? ?Transfers assist ?   ? ?Chair/bed transfer assist level: Independent ?  ?  ?Locomotion ?Ambulation ? ? ?Ambulation assist ? ?   ? ?Assist level: Supervision/Verbal cueing ?Assistive device: No Device ?Max distance: 266ft  ? ?Walk 10 feet activity ? ? ?Assist ?   ? ?Assist level: Supervision/Verbal cueing ?Assistive device: No Device  ? ?Walk 50 feet activity ? ? ?Assist   ? ?  Assist level: Supervision/Verbal cueing ?Assistive device: No Device  ? ? ?Walk 150 feet activity ? ? ?Assist   ? ?Assist level: Supervision/Verbal cueing ?Assistive device: No Device ?  ? ?Walk 10 feet on uneven surface  ?activity ? ? ?Assist   ? ? ?Assist level: Supervision/Verbal cueing ?   ? ?Wheelchair ? ? ? ? ?Assist Is the patient using a wheelchair?: No ?  ?  ? ?  ?   ? ? ?Wheelchair 50 feet with 2 turns activity ? ? ? ?Assist ? ?  ?  ? ? ?   ? ?Wheelchair 150 feet activity  ? ? ? ?Assist ?   ? ? ?   ? ?Blood pressure 139/75, pulse 66, temperature 98.2 ?F (36.8 ?C), temperature source Oral, resp. rate 17, height 5\' 5"  (1.651 m), weight 57.5 kg, SpO2 99 %. ? ?Medical Problem List and Plan: ?1. 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          D/C home today  ? ?  ?2.  Antithrombotics: ?-DVT/anticoagulation:  Pharmaceutical: Lovenox ?            -antiplatelet therapy: Aspirin 81 mg daily and Brilinta 90 mg twice daily x6 months then aspirin alone ?3. Pain Management: Tylenol as needed ?4. Mood: BuSpar 15 mg twice daily, Zoloft 150 mg daily, trazodone 150 mg nightly Atarax as needed anxiety ?            -antipsychotic agents: N/A ?5. Neuropsych: This patient is capable of making decisions on her own behalf. ?6. Skin/Wound Care: Routine skin checks, extensive Right groin , pubic and medial thigh bruising at femoral sheath insertion site  ?7. Fluids/Electrolytes/Nutrition: encourage PO ? 4/3 I personally reviewed the patient's labs today.   ?8.  Hyperlipidemia.  Lipitor ?9.  Hypertension.  Norvasc 5 mg daily.  Monitor with increased mobility ?Vitals:  ? 06/21/21 2056 06/22/21 0500  ?BP: (!) 155/79 139/75  ?Pulse: 67 66  ?Resp: 18 17  ?Temp: 98.5 ?F (36.9 ?C) 98.2 ?F (36.8 ?C)  ?SpO2: 98% 99%  ?  4/7 controlled ?10.  History of tobacco as well as marijuana use.  Provide counseling ?11.  GERD.  Prilosec ?12. Anemia: hgb down to 9.9 today (has fluctuated honestly) ? -likely related to bruising/hematoma a fem sheath insert site ? -check stool for OB ? -recheck labs Wednesday ?  ?13.  Dropping objects R hand due to sensory problems related to CVA discussed with pt ?LOS: ?7 days ?A FACE TO FACE EVALUATION WAS PERFORMED ? ?Luanna Salk Amyjo Mizrachi ?06/22/2021, 8:47 AM  ? ? ? ?

## 2021-06-22 NOTE — Progress Notes (Signed)
Inpatient Rehabilitation Care Coordinator ?Discharge Note  ? ?Patient Details  ?Name: Susan Lewis ?MRN: [id number] ?Date of Birth: 1960/11/28 ? ? ?Discharge location: Home ? ?Length of Stay: 7 Days ? ?Discharge activity level: sup ? ?Home/community participation: spouse ? ?Patient response [place]:MIWOEH Literacy - How often do you need to have someone help you when you read instructions, pamphlets, or other written material from your doctor or pharmacy?: Never ? ?Patient response OZ:YYQMGN Isolation - How often do you feel lonely or isolated from those around you?: Never ? ?Services provided included: SW, TR, CM, Pharmacy, RN, SLP, OT, PT, RD, MD ? ?Financial Services:  ?Field seismologist Utilized: Private Insurance ?[employer] ? ?Choices offered to/list presented to: patient ? ?Follow-up services arranged:  ?Outpatient ?   ?Outpatient Servicies: [facility] ?  ?  ? ?Patient response to transportation need: ?Is the patient able to respond to transportation needs?: Yes ?In the past 12 months, has lack of transportation kept you from medical appointments or from getting medications?: No ?In the past 12 months, has lack of transportation kept you from meetings, work, or from getting things needed for daily living?: No ? ? ? ?Comments (or additional information): ? ?Patient/Family verbalized understanding of follow-up arrangements:  Yes ? ?Individual responsible for coordination of the follow-up plan: Patient or Susan Lewis ? ?Confirmed correct DME delivered: [name] 06/22/2021   ? ?[name] ?

## 2021-06-25 ENCOUNTER — Other Ambulatory Visit (HOSPITAL_COMMUNITY): Payer: Self-pay

## 2021-06-25 ENCOUNTER — Other Ambulatory Visit (HOSPITAL_COMMUNITY): Payer: Self-pay | Admitting: Interventional Radiology

## 2021-06-25 ENCOUNTER — Telehealth (HOSPITAL_COMMUNITY): Payer: Self-pay

## 2021-06-25 DIAGNOSIS — I6522 Occlusion and stenosis of left carotid artery: Secondary | ICD-10-CM

## 2021-06-25 NOTE — Telephone Encounter (Signed)
Called to schedule 2 wk f/u, no answer, left vm. AW 

## 2021-06-26 ENCOUNTER — Telehealth: Payer: Self-pay

## 2021-06-26 NOTE — Telephone Encounter (Signed)
Transition Care Management Unsuccessful Follow-up Telephone Call ? ?Date of discharge and from where:  06/22/21 [facility] ?Attempts:  1st Attempt ? ?Reason for unsuccessful TCM follow-up call:  Left voice message ? ?  ?

## 2021-06-28 ENCOUNTER — Ambulatory Visit (INDEPENDENT_AMBULATORY_CARE_PROVIDER_SITE_OTHER): Payer: 59

## 2021-06-28 DIAGNOSIS — Z1231 Encounter for screening mammogram for malignant neoplasm of breast: Secondary | ICD-10-CM | POA: Diagnosis not present

## 2021-06-29 NOTE — Telephone Encounter (Signed)
Transition Care Management Unsuccessful Follow-up Telephone Call ? ?Date of discharge and from where:  06/22/21 from [facility] ? ?Attempts:  2nd Attempt ? ?Reason for unsuccessful TCM follow-up call:  Left voice message ? ?  ?

## 2021-07-02 ENCOUNTER — Ambulatory Visit (INDEPENDENT_AMBULATORY_CARE_PROVIDER_SITE_OTHER): Payer: 59 | Admitting: Psychology

## 2021-07-02 DIAGNOSIS — F4323 Adjustment disorder with mixed anxiety and depressed mood: Secondary | ICD-10-CM

## 2021-07-02 NOTE — Progress Notes (Signed)
[facility] Counselor/Therapist Progress Note ? ?Patient ID: Susan Lewis, MRN: [id number],   ? ?Date: 07/02/2021 ? ?Time Spent: 2:00pm-2:50pm   50 minutes  ? ?Treatment Type: Individual Therapy ? ?Reported Symptoms: stress, worry ? ?Mental Status Exam: ?Appearance:  Casual     ?Behavior: Appropriate  ?Motor: Normal  ?Speech/Language:  Normal Rate  ?Affect: Appropriate  ?Mood: normal  ?Thought process: normal  ?Thought content:   WNL  ?Sensory/Perceptual disturbances:   WNL  ?Orientation: oriented to person, place, time/date, and situation  ?Attention: Good  ?Concentration: Good  ?Memory: WNL  ?Fund of knowledge:  Good  ?Insight:   Good  ?Judgment:  Good  ?Impulse Control: Good  ? ?Risk Assessment: ?Danger to Self:  No ?Self-injurious Behavior: No ?Danger to Others: No ?Duty to Warn:no ?Physical Aggression / Violence:No  ?Access to Firearms a concern: No  ?Gang Involvement:No  ? ?Subjective: Pt present for face-to-face individual therapy via video Webex.  Pt consents to telehealth video session due to COVID 19 pandemic. ?Location of pt: home  ?Location of therapist: home office.  ?Pt talked about her health.   She recently had a stroke and was in the hospital.   Pt had to have surgery for the blockage in her carotid artery and pt feels like they saved her life.  Pt is recovering well overall.   She is going to OT and PT.  She has some speech deficits that she is working on.  She has deficits in her right hand and arm.   Pt was told it could take up to 6 months to get back to her precious level of functioning.   Pt will have to be out of work for that time.   ?Pt's family has been supportive and helped her around the house.   ?Provided supportive therapy.     ? ?Interventions: Cognitive Behavioral Therapy and Insight-Oriented ? ?Diagnosis: F43.23 ? ?Plan: See pt's Treatment Plan for depression and anxiety in Therapy Charts.  (Treatment Plan Target Date: 11/06/2021) ?Pt is progressing toward treatment  goals.   ?Plan to continue to see pt every two weeks.   ? ?Marelly Wehrman, LCSW ? ? ? ?

## 2021-07-09 ENCOUNTER — Encounter: Payer: 59 | Attending: Registered Nurse | Admitting: Registered Nurse

## 2021-07-09 VITALS — BP 117/69 | HR 54 | Ht 65.0 in | Wt 136.0 lb

## 2021-07-09 DIAGNOSIS — R001 Bradycardia, unspecified: Secondary | ICD-10-CM | POA: Insufficient documentation

## 2021-07-09 DIAGNOSIS — I10 Essential (primary) hypertension: Secondary | ICD-10-CM | POA: Insufficient documentation

## 2021-07-09 DIAGNOSIS — I63512 Cerebral infarction due to unspecified occlusion or stenosis of left middle cerebral artery: Secondary | ICD-10-CM | POA: Diagnosis present

## 2021-07-09 NOTE — Progress Notes (Signed)
? ?Subjective:  ? ? Patient ID: Susan Lewis, female    DOB: [birth year], 61 y.o.   MRN: [id number] ? ?HPI: Susan Lewis is a 61 y.o. female who is here for [facility] [place] appointment for follow up of her Left Middle Cerebral Artery Stroke, Essential Hypertension and Bradycardia. She presented to [facility] on   06/12/2021 via EMS, with aphasia and right sided weakness.  ?Dr Leonie Man H&P: 06/12/2021 ?Neurology H & P ?  ?Reason for Consult: code stroke ?Referring Physician: Dr. Vanita Panda ?  ?CC: code stroke ?  ?History is obtained from: chart review, patient, EMS ?  ?HPI: Susan Lewis is a 61 y.o. female with a past medical history of COPD, cataracts, and PTSD, who presents to the emergency department today as a code stroke.  The patient was last seen well by her husband at 24 PM last night.  At this time she was in her usual state of health.  At baseline she is independent for ADLs and IADLs.  She reportedly woke up at 5:30 in the morning today and was unable to get out of bed.  In trying to do so, she fell onto the floor.  Family noticed difficulty with patient communication and called EMS who noticed aphasia and right hemiparesis and called a code stroke in route.  On assessment upon arrival at the bridge, the patient had severe expressive aphasia with a right-sided hemiplegia.  With NIH stroke scale of 13.  Noncontrast CT scan of the head was obtained emergently which showed low-density in the like left insula compatible with acute stroke as well as old left parietal hypodensity compatible with a remote stroke.  The left middle cerebral artery also had hyperdense sign.  Aspect score was 7.  Patient underwent immediate CT angiogram of the brain and CT perfusion.  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  The likely left M2 occlusion CT perfusion showed core infarct of 22 mL with mismatch volume of 38  mL.  I discussed with patient's son and husband at the bedside risk-benefit of mechanical thrombectomy and they wanted to proceed.  Spoke to Dr. Estanislado Pandy from interventional neuroradiology who agreed with taking the patient for emergent mechanical thrombectomy.  Patient's husband denies any known prior history of stroke or carotid stenosis.  Patient was not on any blood thinners at baseline ? ?CT Head:  ? IMPRESSION: ?1. Evidence of acute on chronic Left MCA territory ischemia and ?hyperdense Left MCA branch in the sylvian fissure suspicious for ?ELVO. ASPECTS 7. No acute intracranial hemorrhage or mass effect. ?  ?CT Angio:  ?IMPRESSION: ?1. Positive for tandem Emergent Large Vessel Occlusions: Left ICA in ?the neck, and Posterior M2 Left MCA branch at the bifurcation. ?  ?2. CTP detects a 22 mL Left MCA infarct core, with 60 mL penumbra, ?38 mL mismatch volume. ?  ?3. Superimposed ICA siphon atherosclerosis with Severe Right ?cavernous segment ICA stenosis due to calcified plaque. Negative ?posterior circulation. ?  ?4. Aortic Atherosclerosis (ICD10-I70.0). ?  ?Susan Lewis underwent Thrombectomy : Per Dr Leonie Man Note: ?s/p mechanical thrombectomy with TICI3 revascularization and stent-assisted angioplasty 06/12/21 with Dr. Estanislado Pandy ? ?CT : Post Thrombectomy:  ?IMPRESSION: ?Status post thrombectomy, with hyperdensity in the left MCA ?territory, favored to represent post thrombectomy contrast staining ?although petechial hemorrhage could appear similar. Additional ?hyperdensity along the course of the left M1 and M2, which may ?represent contrast  extravasation from the left MCA versus ?subarachnoid hemorrhage. These areas of hyperdensity appear similar ?to the CT done immediately after intervention. ? ?Susan Lewis was maintained on Aspirin and Brillinta. ? ?Susan Lewis was admitted to inpatient rehabilitation on 06/15/2021 and discharged home on 06/22/2021. She was supposed to be scheduled with [facility], her husband states he never received a phone call. I call [facility], they never received a referral. Sent a e-mail to [facility] SW, regarding the above, she will call Mr. Gillihan and follow up with outpatient rehabilitation. They verbaize understanding.  ? ?Susan Lewis still having expressive aphasia and at times word finding difficulties.  ? ?Husband in room, all questions answered.  ?  ? ? ?Pain Inventory ?Average Pain 8 ?Pain Right Now 8 ?My pain is dull and aching ? ?LOCATION OF PAIN  neck, shoulder, hand, fingers, back, hip, toes ? ?BOWEL ?Number of stools per week: 7 ?Oral laxative use No  ?Type of laxative . ?Enema or suppository use No  ?History of colostomy No  ?Incontinent No  ? ?BLADDER ?Normal ?In and out cath, frequency . ?Able to self cath  . ?Bladder incontinence Yes  ?Frequent urination No  ?Leakage with coughing Yes  ?Difficulty starting stream No  ?Incomplete bladder emptying No  ? ? ?Mobility ?walk without assistance ?how many minutes can you walk? 15 ?ability to climb steps?  yes ?do you drive?  no ? ?Function ?disabled: date disabled 05/2021 ?I need assistance with the following:  feeding and bathing ? ?Neuro/Psych ?tingling ?trouble walking ?confusion ?anxiety ? ?Prior Studies ?TC appt ? ?Physicians involved in your care ?TC appt ? ? ?No family history on file. ?Social History  ? ?Socioeconomic History  ? Marital status: Married  ?  Spouse name: Not on file  ? Number of children: 2  ? Years of education: Not on file  ? Highest education level: Not on file  ?Occupational History  ? Occupation: Statistician  ?Tobacco Use  ? Smoking status: Every Day  ?  Packs/day: 1.00  ?  Years: 45.00  ?  Pack years: 45.00  ?  Types: Cigarettes  ?  Start date: 08/28/1971  ? Smokeless tobacco: Never  ?Vaping Use  ? Vaping Use: Never used  ?Substance and Sexual Activity  ? Alcohol use: No  ? Drug use: Yes  ?  Types: Marijuana  ?  Comment: Frequent, daily use  ? Sexual  activity: Yes  ?Other Topics Concern  ? Not on file  ?Social History Narrative  ? Not on file  ? ?Social Determinants of Health  ? ?Financial Resource Strain: Not on file  ?Food Insecurity: Not on file  ?Transportation Needs: Not on file  ?Physical Activity: Not on file  ?Stress: Not on file  ?Social Connections: Not on file  ? ?Past Surgical History:  ?Procedure Laterality Date  ? CARPAL TUNNEL RELEASE Right   ? carpel tunnel   ? CESAREAN SECTION    ? CHOLECYSTECTOMY    ? ENDOMETRIAL ABLATION    ? EXCISIONAL HEMORRHOIDECTOMY    ? IR ANGIO EXTRACRAN SEL COM CAROTID INNOMINATE UNI R MOD SED  06/12/2021  ? IR CT HEAD LTD  06/12/2021  ? IR CT HEAD LTD  06/12/2021  ? IR INTRAVSC STENT CERV CAROTID W/O EMB-PROT MOD SED INC ANGIO  06/12/2021  ? IR PERCUTANEOUS ART THROMBECTOMY/INFUSION INTRACRANIAL INC DIAG ANGIO  06/12/2021  ? OOPHORECTOMY Left   ? ORIF ANKLE FRACTURE BIMALLEOLAR    ?  RADIOLOGY WITH ANESTHESIA N/A 06/12/2021  ? Procedure: IR WITH ANESTHESIA;  Surgeon: Radiologist, Medication, MD;  Location: [place];  Service: Radiology;  Laterality: N/A;  ? ?Past Medical History:  ?Diagnosis Date  ? Anxiety   ? Arthritis   ? Bursitis   ? Carpal tunnel syndrome   ? COPD (chronic obstructive pulmonary disease) ([place])   ? Depression   ? Fibromyalgia   ? Fibromyalgia   ? GERD (gastroesophageal reflux disease)   ? Hemorrhoids 10/27/2014  ? Hyperlipidemia   ? Hypertension   ? Kidney stones   ? ?BP 117/69   Pulse (!) 54   Ht 5\' 5"  (1.651 m)   Wt 136 lb (61.7 kg)   SpO2 97%   BMI 22.63 kg/m?  ? ?Opioid Risk Score:   ?Fall Risk Score:  `1 ? ?Depression screen PHQ 2/9 ? ? ?  07/09/2021  ?  2:45 PM 06/28/2020  ?  3:57 PM 06/28/2020  ?  2:56 PM  ?Depression screen PHQ 2/9  ?Decreased Interest 3 2 2   ?Down, Depressed, Hopeless 2 2 2   ?PHQ - 2 Score 5 4 4   ?Altered sleeping 2 0   ?Tired, decreased energy 2 3   ?Change in appetite 3 0   ?Feeling bad or failure about yourself  0 3   ?Trouble concentrating 2 3   ?Moving slowly or  fidgety/restless 3 2   ?Suicidal thoughts 0 0   ?PHQ-9 Score 17 15   ?Difficult doing work/chores Somewhat difficult Somewhat difficult   ?  ? ?Review of Systems  ?Musculoskeletal:  Positive for back pain, gait problem and ne

## 2021-07-10 NOTE — Progress Notes (Addendum)
? ?NEUROLOGY CONSULTATION NOTE ? ?Susan Lewis ?MRN: [id number] ?DOB: [birth year] ? ?Referring provider: Arlester Marker, MD ?Primary care provider: Arlester Marker, MD ? ?Reason for consult:  stroke, visual disturbance ? ?Assessment/Plan:  ? 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   ? ?Secondary stroke prevention: ?ASA 81mg  daily and Brilinta 90mg  twice daily for 6 months, followed by ASA 81mg  daily monotherapy ?Atorvastatin 20mg  daily.  LDL goal less than 70 ?Normotensive blood pressure ?Hgb A1c goal less than 7 ?Continue speech/PT/OT ?Smoking cessation - no cigarettes since hospitalization. ?Mediterranean diet ?Follow up in 6 months.   ? ? ? ? ?Subjective:  ?Susan Lewis is a 61 year old right-handed female with COPD, CAD, HTN, HLD, bilateral cataract, fibromyalgia/chronic degenerative neck and back pain, depression, PTSD, tobacco use disorder and history of kidney stones who presents for stroke.  History supplemented by hospital and referring provider's notes.  CT/CT perfusion head, CTA head and neck and MRI brain personally reviewed. ? ?On 06/12/2021, patient developed sudden onset aphasia and right extremity weakness.  She presented to [facility] ED where CT head showed left frontal insular infarct CT perfusion showed significant penumbra and salvageable brain tissue.  CTA head and neck showed left carotid and left M2 branch occlusion.  NIHSS was 13.  She was outside window of IV tPA but within window for mechanical thrombectomy with TICI3 revascularization and stent-assisted angioplasty with Dr. Estanislado Pandy.  TTE showed hyperdynamic LVEF 70% with mildly dilated  left atrial size but no regional wall motion abnormalities or interatrial shunt.  LDL was 51 and Hgb A1c was 5.1.  She was not on antithrombotic therapy prior to admission.  She was discharged on ASA 81mg  daily and Brilinta 90mg  twice daily for 6 months followed by ASA 81mg  daily alone.  She was continued on her current atorvastatin 20mg  daily.  She went to inpatient rehab and was discharged on 06/22/2021.  She is supposed to get speech therapy and PT.   ?  ?Prior to the stroke, she was having visual disturbance.  She has bilateral cataract but noted significantly worsened vision in the left eye.  ? ?PAST MEDICAL HISTORY: ?Past Medical History:  ?Diagnosis Date  ? Anxiety   ? Arthritis   ? Bursitis   ? Carpal tunnel syndrome   ? COPD (chronic obstructive pulmonary disease) (English)   ? Depression   ? Fibromyalgia   ? Fibromyalgia   ? GERD (gastroesophageal reflux disease)   ? Hemorrhoids 10/27/2014  ? Hyperlipidemia   ? Hypertension   ? Kidney stones   ? ? ?PAST SURGICAL HISTORY: ?Past Surgical History:  ?Procedure Laterality Date  ? CARPAL TUNNEL RELEASE Right   ? carpel tunnel   ? CESAREAN SECTION    ? CHOLECYSTECTOMY    ? ENDOMETRIAL ABLATION    ? EXCISIONAL HEMORRHOIDECTOMY    ? IR ANGIO EXTRACRAN SEL COM CAROTID INNOMINATE UNI R MOD SED  06/12/2021  ? IR CT HEAD LTD  06/12/2021  ? IR CT HEAD LTD  06/12/2021  ? IR INTRAVSC STENT CERV CAROTID W/O EMB-PROT MOD SED INC ANGIO  06/12/2021  ? IR PERCUTANEOUS ART THROMBECTOMY/INFUSION INTRACRANIAL INC DIAG ANGIO  06/12/2021  ? OOPHORECTOMY Left   ? ORIF  ANKLE FRACTURE BIMALLEOLAR    ? RADIOLOGY WITH ANESTHESIA N/A 06/12/2021  ? Procedure: IR WITH ANESTHESIA;  Surgeon: Radiologist, Medication, MD;  Location: [facility];  Service: Radiology;  Laterality: N/A;  ? ? ?MEDICATIONS: ?Current Outpatient Medications on File Prior to Visit  ?Medication Sig Dispense Refill  ? acetaminophen (TYLENOL) 325 MG tablet Take 2 tablets (650 mg total) by mouth every 4 (four) hours as needed for mild pain  (or temp > 37.5 C (99.5 F)).    ? albuterol (VENTOLIN HFA) 108 (90 Base) MCG/ACT inhaler INHALE 2 PUFFS INTO THE LUNGS EVERY 6 HOURS AS NEEDED FOR UP TO 30 DAYS FOR WHEEZING 8 g 0  ? amLODipine (NORVASC) 5 MG tablet Take 1 tablet (5 mg total) by mouth daily. 30 tablet 0  ? aspirin 81 MG chewable tablet Chew 1 tablet (81 mg total) by mouth daily. 30 tablet 0  ? atorvastatin (LIPITOR) 20 MG tablet Take 1 tablet (20 mg total) by mouth at bedtime. 30 tablet 0  ? azelastine (ASTELIN) 0.1 % nasal spray Place 1 spray into both nostrils 2 (two) times daily. Use in each nostril as directed    ? busPIRone (BUSPAR) 15 MG tablet Take 1 tablet (15 mg total) by mouth 2 (two) times daily. 60 tablet 5  ? calcium carbonate (TUMS - DOSED IN MG ELEMENTAL CALCIUM) 500 MG chewable tablet Chew 1 tablet (200 mg of elemental calcium total) by mouth 3 (three) times daily.    ? fluticasone (FLONASE) 50 MCG/ACT nasal spray Place 1 spray into both nostrils daily. 9.9 mL 5  ? gabapentin (NEURONTIN) 300 MG capsule Take 1 capsule (300 mg total) by mouth 2 (two) times daily. 180 capsule 3  ? hydrOXYzine (ATARAX) 25 MG tablet Take 1 tablet (25 mg total) by mouth 3 (three) times daily as needed for anxiety. 10 tablet 0  ? omeprazole (PRILOSEC) 40 MG capsule Take 1 capsule (40 mg total) by mouth daily. 30 capsule 0  ? oxybutynin (DITROPAN) 5 MG tablet Take 1 tablet (5 mg total) by mouth 3 (three) times daily. 90 tablet 2  ? sertraline (ZOLOFT) 100 MG tablet Take 1.5 tablets (150 mg total) by mouth daily. 45 tablet 0  ? solifenacin (VESICARE) 5 MG tablet Take 1 tablet (5 mg total) by mouth daily. 30 tablet 0  ? ticagrelor (BRILINTA) 90 MG TABS tablet Take 1 tablet (90 mg total) by mouth 2 (two) times daily. 60 tablet 0  ? traMADol (ULTRAM) 50 MG tablet Take 1 tablet (50 mg total) by mouth every 12 (twelve) hours as needed. 30 tablet 0  ? traZODone (DESYREL) 150 MG tablet Take 1 tablet (150 mg total) by mouth at bedtime. 30 tablet 0  ? ?No current  facility-administered medications on file prior to visit.  ? ? ?ALLERGIES: ?Allergies  ?Allergen Reactions  ? Morphine Other (See Comments)  ?  Hallucination  ? Codeine Itching  ? ? ?FAMILY HISTORY: ?No family history on file. ? ?Objective:  ?Blood pressure 139/87, pulse 69, resp. rate 18, height 5\' 5"  (1.651 m), weight 135 lb (61.2 kg), SpO2 98 %. ?General: No acute distress.  Patient appears well-groomed.   ?Head:  Normocephalic/atraumatic ?Eyes:  fundi examined but not visualized ?Neck: supple, no paraspinal tenderness, full range of motion ?Back: No paraspinal tenderness ?Heart: regular rate and rhythm ?Lungs: Clear to auscultation bilaterally. ?Vascular: No carotid bruits. ?Neurological Exam: ?Mental status: alert and oriented to person, place, and time, recent and remote memory intact, fund of knowledge  intact, attention and concentration intact, expressive aphasia, not dysarthric ?Cranial nerves: ?CN I: not tested ?CN II: pupils equal, round and reactive to light, vision loss left eye ?CN III, IV, VI:  full range of motion, no nystagmus, no ptosis ?CN V: facial sensation intact. ?CN VII: upper and lower face symmetric ?CN VIII: hearing intact ?CN IX, X: gag intact, uvula midline ?CN XI: sternocleidomastoid and trapezius muscles intact ?CN XII: tongue midline ?Bulk & Tone: normal, no fasciculations. ?Motor:  muscle strength 5-/5 left deltoid, reduced finger-thumb tapping on right, otherwise 5/5 throughout ?Sensation:  Pinprick, temperature and vibratory sensation intact. ?Deep Tendon Reflexes:  2+ throughout,  toes downgoing.   ?Finger to nose testing:  Without dysmetria.   ?Heel to shin:  Without dysmetria.   ?Gait:  Normal station and stride.  Romberg negative. ? ? ? ?Thank you for allowing me to take part in the care of this patient. ? ?Metta Clines, DO ? ?CC: Arlester Marker, MD ? ? ? ? ?

## 2021-07-11 ENCOUNTER — Ambulatory Visit (INDEPENDENT_AMBULATORY_CARE_PROVIDER_SITE_OTHER): Payer: 59 | Admitting: Neurology

## 2021-07-11 ENCOUNTER — Encounter: Payer: Self-pay | Admitting: Neurology

## 2021-07-11 VITALS — BP 139/87 | HR 69 | Resp 18 | Ht 65.0 in | Wt 135.0 lb

## 2021-07-11 DIAGNOSIS — I10 Essential (primary) hypertension: Secondary | ICD-10-CM

## 2021-07-11 DIAGNOSIS — F17200 Nicotine dependence, unspecified, uncomplicated: Secondary | ICD-10-CM

## 2021-07-11 DIAGNOSIS — I6522 Occlusion and stenosis of left carotid artery: Secondary | ICD-10-CM

## 2021-07-11 DIAGNOSIS — E785 Hyperlipidemia, unspecified: Secondary | ICD-10-CM

## 2021-07-11 DIAGNOSIS — I63412 Cerebral infarction due to embolism of left middle cerebral artery: Secondary | ICD-10-CM | POA: Diagnosis not present

## 2021-07-11 NOTE — Patient Instructions (Addendum)
Continue aspirin 81mg  daily and Brilinta 90mg  twice daily.  Plan is to continue both medications for 6 months, then you will stop Brilinta and continue aspirin 81mg  daily ?Continue atorvastatin ?Blood pressure control ?Continue physical therapy/speech therapy ?Mediterranean diet (see below) ?Continue not to smoke ?Follow up 6 months. ? ? ?Mediterranean Diet ?A Mediterranean diet refers to food and lifestyle choices that are based on the traditions of countries located on the . It focuses on eating more fruits, vegetables, whole grains, beans, nuts, seeds, and heart-healthy fats, and eating less dairy, meat, eggs, and processed foods with added sugar, salt, and fat. This way of eating has been shown to help prevent certain conditions and improve outcomes for people who have chronic diseases, like kidney disease and heart disease. ?What are tips for following this plan? ?Reading food labels ?Check the serving size of packaged foods. For foods such as rice and pasta, the serving size refers to the amount of cooked product, not dry. ?Check the total fat in packaged foods. Avoid foods that have saturated fat or trans fats. ?Check the ingredient list for added sugars, such as corn syrup. ?Shopping ? ?Buy a variety of foods that offer a balanced diet, including: ?Fresh fruits and vegetables (produce). ?Grains, beans, nuts, and seeds. Some of these may be available in unpackaged forms or large amounts (in bulk). ?Fresh seafood. ?Poultry and eggs. ?Low-fat dairy products. ?Buy whole ingredients instead of prepackaged foods. ?Buy fresh fruits and vegetables in-season from local farmers markets. ?Buy plain frozen fruits and vegetables. ?If you do not have access to quality fresh seafood, buy precooked frozen shrimp or canned fish, such as tuna, salmon, or sardines. ?Stock your pantry so you always have certain foods on hand, such as olive oil, canned tuna, canned tomatoes, rice, pasta, and  beans. ?Cooking ?Cook foods with extra-virgin olive oil instead of using butter or other vegetable oils. ?Have meat as a side dish, and have vegetables or grains as your main dish. This means having meat in small portions or adding small amounts of meat to foods like pasta or stew. ?Use beans or vegetables instead of meat in common dishes like chili or lasagna. ?Experiment with different cooking methods. Try roasting, broiling, steaming, and saut?ing vegetables. ?Add frozen vegetables to soups, stews, pasta, or rice. ?Add nuts or seeds for added healthy fats and plant protein at each meal. You can add these to yogurt, salads, or vegetable dishes. ?Marinate fish or vegetables using olive oil, lemon juice, garlic, and fresh herbs. ?Meal planning ?Plan to eat one vegetarian meal one day each week. Try to work up to two vegetarian meals, if possible. ?Eat seafood two or more times a week. ?Have healthy snacks readily available, such as: ?Vegetable sticks with hummus. ? yogurt. ?Fruit and nut trail mix. ?Eat balanced meals throughout the week. This includes: ?Fruit: 2-3 servings a day. ?Vegetables: 4-5 servings a day. ?Low-fat dairy: 2 servings a day. ?Fish, poultry, or lean meat: 1 serving a day. ?Beans and legumes: 2 or more servings a week. ?Nuts and seeds: 1-2 servings a day. ?Whole grains: 6-8 servings a day. ?Extra-virgin olive oil: 3-4 servings a day. ?Limit red meat and sweets to only a few servings a month. ?Lifestyle ? ?Cook and eat meals together with your family, when possible. ?Drink enough fluid to keep your urine pale yellow. ?Be physically active every day. This includes: ?Aerobic exercise like running or swimming. ?Leisure activities like gardening, walking, or housework. ?Get 7-8 hours of sleep  each night. ?If recommended by your health care provider, drink red wine in moderation. This means 1 glass a day for nonpregnant women and 2 glasses a day for men. A glass of wine equals 5 oz (150 mL). ?What  foods should I eat? ?Fruits ?Apples. Apricots. Avocado. Berries. Bananas. Cherries. Dates. Figs. Grapes. Lemons. Melon. Oranges. Peaches. Plums. Pomegranate. ?Vegetables ?Artichokes. Beets. Broccoli. Cabbage. Carrots. Eggplant. Green beans. Chard. Kale. Spinach. Onions. Leeks. Peas. Squash. Tomatoes. Peppers. Radishes. ?Grains ?Whole-grain pasta. Brown rice. Bulgur wheat. Polenta. Couscous. Whole-wheat bread. [place]. ?Meats and other proteins ?Beans. Almonds. Sunflower seeds. Pine nuts. Peanuts. [place]. Salmon. Scallops. Shrimp. [place]. Tilapia. Clams. Oysters. Eggs. Poultry without skin. ?Dairy ?Low-fat milk. Cheese. Greek yogurt. ?Fats and oils ?Extra-virgin olive oil. Avocado oil. Grapeseed oil. ?Beverages ?Water. Red wine. Herbal tea. ?Sweets and desserts ?Greek yogurt with honey. Baked apples. Poached pears. Trail mix. ?Seasonings and condiments ?Basil. Cilantro. Coriander. Cumin. Mint. Parsley. Sage. Rosemary. Tarragon. Garlic. Oregano. Thyme. Pepper. Balsamic vinegar. Tahini. Hummus. Tomato sauce. Olives. Mushrooms. ?The items listed above may not be a complete list of foods and beverages you can eat. Contact a dietitian for more information. ?What foods should I limit? ?This is a list of foods that should be eaten rarely or only on special occasions. ?Fruits ?Fruit canned in syrup. ?Vegetables ?Deep-fried potatoes (french fries). ?Grains ?Prepackaged pasta or rice dishes. Prepackaged cereal with added sugar. Prepackaged snacks with added sugar. ?Meats and other proteins ?Beef. Pork. Lamb. Poultry with skin. Hot dogs. [place] Salines. ?Dairy ?Ice cream. Sour cream. Whole milk. ?Fats and oils ?Butter. Canola oil. Vegetable oil. Beef fat (tallow). Lard. ?Beverages ?Juice. Sugar-sweetened soft drinks. Beer. Liquor and spirits. ?Sweets and desserts ?Cookies. Cakes. Pies. Candy. ?Seasonings and condiments ?Mayonnaise. Pre-made sauces and marinades. ?The items listed above may not be a complete list of foods and  beverages you should limit. Contact a dietitian for more information. ?Summary ?The Mediterranean diet includes both food and lifestyle choices. ?Eat a variety of fresh fruits and vegetables, beans, nuts, seeds, and whole grains. ?Limit the amount of red meat and sweets that you eat. ?If recommended by your health care provider, drink red wine in moderation. This means 1 glass a day for nonpregnant women and 2 glasses a day for men. A glass of wine equals 5 oz (150 mL). ?This information is not intended to replace advice given to you by your health care provider. Make sure you discuss any questions you have with your health care provider. ?Document Revised: 04/09/2019 Document Reviewed: 02/04/2019 ?Elsevier Patient Education ? [place]. ? ?

## 2021-07-13 ENCOUNTER — Encounter: Payer: Self-pay | Admitting: Registered Nurse

## 2021-07-16 ENCOUNTER — Ambulatory Visit (INDEPENDENT_AMBULATORY_CARE_PROVIDER_SITE_OTHER): Payer: 59 | Admitting: Psychology

## 2021-07-16 DIAGNOSIS — F4323 Adjustment disorder with mixed anxiety and depressed mood: Secondary | ICD-10-CM

## 2021-07-16 NOTE — Progress Notes (Signed)
[facility] Counselor/Therapist Progress Note ? ?Patient ID: Susan Lewis, MRN: [id number],   ? ?Date: 07/16/2021 ? ?Time Spent: 11:00am-11:50am   50 minutes  ? ?Treatment Type: Individual Therapy ? ?Reported Symptoms: stress, worry ? ?Mental Status Exam: ?Appearance:  Casual     ?Behavior: Appropriate  ?Motor: Normal  ?Speech/Language:  Normal Rate  ?Affect: Appropriate  ?Mood: normal  ?Thought process: normal  ?Thought content:   WNL  ?Sensory/Perceptual disturbances:   WNL  ?Orientation: oriented to person, place, time/date, and situation  ?Attention: Good  ?Concentration: Good  ?Memory: WNL  ?Fund of knowledge:  Good  ?Insight:   Good  ?Judgment:  Good  ?Impulse Control: Good  ? ?Risk Assessment: ?Danger to Self:  No ?Self-injurious Behavior: No ?Danger to Others: No ?Duty to Warn:no ?Physical Aggression / Violence:No  ?Access to Firearms a concern: No  ?Gang Involvement:No  ? ?Subjective: Pt present for face-to-face individual therapy via video Webex.  Pt consents to telehealth video session due to COVID 19 pandemic. ?Location of pt: home  ?Location of therapist: home office.  ?Pt talked about her health.  She will have cataract surgery next week.  Pt still has speech deficits from her stroke.   She is in PT and OT and speech therapy.  She is having trouble with one of her hands and is trying to use it and work it as much as possible.  Pt gets frustrated with having trouble finding her words.   Addressed pt's frustrations.   She is trying to be hopeful that she will continue to recover.   ?Pt talked about her oldest son getting into therapy.  Pt is glad that he is getting help.   ?Pt's family has been supportive and helped her around the house.   ?Provided supportive therapy.     ? ?Interventions: Cognitive Behavioral Therapy and Insight-Oriented ? ?Diagnosis: F43.23 ? ?Plan: See pt's Treatment Plan for depression and anxiety in Therapy Charts.  (Treatment Plan Target Date: 11/06/2021) ?Pt is  progressing toward treatment goals.   ?Plan to continue to see pt every two weeks.   ? ?Keane Martelli, LCSW ? ? ? ?

## 2021-07-19 ENCOUNTER — Ambulatory Visit (INDEPENDENT_AMBULATORY_CARE_PROVIDER_SITE_OTHER): Payer: 59

## 2021-07-19 ENCOUNTER — Encounter: Payer: Self-pay | Admitting: Podiatry

## 2021-07-19 ENCOUNTER — Ambulatory Visit (INDEPENDENT_AMBULATORY_CARE_PROVIDER_SITE_OTHER): Payer: 59 | Admitting: Podiatry

## 2021-07-19 DIAGNOSIS — M2012 Hallux valgus (acquired), left foot: Secondary | ICD-10-CM | POA: Diagnosis not present

## 2021-07-19 DIAGNOSIS — M21611 Bunion of right foot: Secondary | ICD-10-CM | POA: Diagnosis not present

## 2021-07-19 DIAGNOSIS — M21612 Bunion of left foot: Secondary | ICD-10-CM | POA: Diagnosis not present

## 2021-07-19 DIAGNOSIS — M5416 Radiculopathy, lumbar region: Secondary | ICD-10-CM

## 2021-07-19 DIAGNOSIS — M2011 Hallux valgus (acquired), right foot: Secondary | ICD-10-CM | POA: Diagnosis not present

## 2021-07-19 DIAGNOSIS — M2010 Hallux valgus (acquired), unspecified foot: Secondary | ICD-10-CM

## 2021-07-19 NOTE — Progress Notes (Addendum)
?  Subjective:  ?Patient ID: Susan Lewis, female    DOB: 1960-07-22,   MRN: [id number] ? ?Chief Complaint  ?Patient presents with  ? Foot Pain  ?  Dorsal forefoot bilateral - aching, sharp, sudden pains x several months, bunion deformity bilateral, radiating sensations into toes, does have a pinch nerve in her back, she just had a stroke about 1.5 month ago, speech impairment and right side weakness, mentions her toenails are long and unable to clip them herself  ? ? ?61 y.o. female presents for concern of bilateral bunions. She has been dealing with this for years. Relates aching and sudden pains radiating to toes and around bunion deformity.  She does have difficulty walking because of a pinched nerve in her back and has a previous ankle fracture repair on the left in 2017.  She also recently had a stroke about a month ago.   She was seen by Dr. Lilian Kapur a year ago to discuss. At that time they did discuss surgery including fusion of the great toe joint. She was a smoker and they had discussed smoking cessation.  . Denies any other pedal complaints. Denies n/v/f/c.  ? ?Past Medical History:  ?Diagnosis Date  ? Anxiety   ? Arthritis   ? Bursitis   ? Carpal tunnel syndrome   ? COPD (chronic obstructive pulmonary disease) (HCC)   ? Depression   ? Fibromyalgia   ? Fibromyalgia   ? GERD (gastroesophageal reflux disease)   ? Hemorrhoids 10/27/2014  ? Hyperlipidemia   ? Hypertension   ? Kidney stones   ? ? ?Objective:  ?Physical Exam: ?Vascular: DP/PT pulses 2/4 bilateral. CFT <3 seconds. Normal hair growth on digits. No edema.  ?Skin. No lacerations or abrasions bilateral feet.  ?Musculoskeletal: MMT 5/5 bilateral lower extremities in DF, PF, Inversion and Eversion. Deceased ROM in DF of ankle joint. Minimal pain to palpation of the medial eminence of bilateral feet. Moderate HAV deformities noted. No pain to the lateral aspect of bilateral feet around fifth digits and metatarsals.  ?Neurological: Sensation intact to light  touch.  ? ?Assessment:  ? ?1. Bilateral bunions   ?2. Lumbar radiculopathy   ? ? ? ?Plan:  ?Patient was evaluated and treated and all questions answered. ?-Xrays reviewed. Moderate HAV deformity bilateral with no real change from previous years. Retained hardware in left ankle noted. Subacute fractures through right fifth metatarsal head and proximal phalanx of left fifth metatarsal with no clinical correlation.  ?-Discussed HAV and treatment options;conservative and surgical management; risks, benefits, alternatives discussed. All patient's questions answered. ?-Discussed lumbar radiculopathy and pain in the feet from a pinched nerve. Discussed this may be the predominant cause of her pain as most of it is burning and tingling and radiating up legs.  ?-Suggested a couple topicals to try including capaisin for the nerve pain.  ?-Discussed padding and wide shoe gear.   ?-Recommend continue with good supportive shoes and inserts.  ?-Discussed surgical options. Did discuss due to recent stroke that she should wait at least a year or longer if able before doing any elective surgery. Also not convinced the majority of her pain comes from her bunion and surgery may not relief the pain. She expressed understanding.   ?-Patient to return to office as needed or sooner if condition worsens. ? ? ?Louann Sjogren, DPM  ? ? ?

## 2021-07-23 ENCOUNTER — Ambulatory Visit (INDEPENDENT_AMBULATORY_CARE_PROVIDER_SITE_OTHER): Payer: 59 | Admitting: Family Medicine

## 2021-07-23 VITALS — BP 116/70 | HR 69 | Temp 98.4°F | Ht 65.0 in | Wt 142.2 lb

## 2021-07-23 DIAGNOSIS — E785 Hyperlipidemia, unspecified: Secondary | ICD-10-CM | POA: Diagnosis not present

## 2021-07-23 DIAGNOSIS — K219 Gastro-esophageal reflux disease without esophagitis: Secondary | ICD-10-CM | POA: Diagnosis not present

## 2021-07-23 DIAGNOSIS — F17200 Nicotine dependence, unspecified, uncomplicated: Secondary | ICD-10-CM

## 2021-07-23 DIAGNOSIS — I63412 Cerebral infarction due to embolism of left middle cerebral artery: Secondary | ICD-10-CM

## 2021-07-23 DIAGNOSIS — I10 Essential (primary) hypertension: Secondary | ICD-10-CM

## 2021-07-23 DIAGNOSIS — F339 Major depressive disorder, recurrent, unspecified: Secondary | ICD-10-CM

## 2021-07-23 DIAGNOSIS — F5101 Primary insomnia: Secondary | ICD-10-CM

## 2021-07-23 DIAGNOSIS — M5137 Other intervertebral disc degeneration, lumbosacral region: Secondary | ICD-10-CM

## 2021-07-23 MED ORDER — AMLODIPINE BESYLATE 5 MG PO TABS: 5.0000 mg | ORAL_TABLET | Freq: Every day | ORAL | 3 refills | Status: DC

## 2021-07-23 MED ORDER — OMEPRAZOLE 40 MG PO CPDR: 40.0000 mg | DELAYED_RELEASE_CAPSULE | Freq: Every day | ORAL | 3 refills | Status: DC

## 2021-07-23 MED ORDER — SERTRALINE HCL 100 MG PO TABS: 150.0000 mg | ORAL_TABLET | Freq: Every day | ORAL | 3 refills | Status: DC

## 2021-07-23 MED ORDER — TRAMADOL HCL 50 MG PO TABS: 50.0000 mg | ORAL_TABLET | Freq: Two times a day (BID) | ORAL | 0 refills | Status: DC | PRN

## 2021-07-23 MED ORDER — ATORVASTATIN CALCIUM 20 MG PO TABS: 20.0000 mg | ORAL_TABLET | Freq: Every day | ORAL | 3 refills | Status: DC

## 2021-07-23 MED ORDER — TRAZODONE HCL 150 MG PO TABS: 150.0000 mg | ORAL_TABLET | Freq: Every day | ORAL | 3 refills | Status: DC
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
## 2021-07-30 ENCOUNTER — Ambulatory Visit (INDEPENDENT_AMBULATORY_CARE_PROVIDER_SITE_OTHER): Payer: 59 | Admitting: Psychology

## 2021-07-30 DIAGNOSIS — F4323 Adjustment disorder with mixed anxiety and depressed mood: Secondary | ICD-10-CM

## 2021-07-30 NOTE — Progress Notes (Signed)
[facility] Counselor/Therapist Progress Note ? ?Patient ID: Susan Lewis, MRN: [id number],   ? ?Date: 07/30/2021 ? ?Time Spent: 10:00am-10:50am   50 minutes  ? ?Treatment Type: Individual Therapy ? ?Reported Symptoms: stress, worry ? ?Mental Status Exam: ?Appearance:  Casual     ?Behavior: Appropriate  ?Motor: Normal  ?Speech/Language:  Normal Rate  ?Affect: Appropriate  ?Mood: normal  ?Thought process: normal  ?Thought content:   WNL  ?Sensory/Perceptual disturbances:   WNL  ?Orientation: oriented to person, place, time/date, and situation  ?Attention: Good  ?Concentration: Good  ?Memory: WNL  ?Fund of knowledge:  Good  ?Insight:   Good  ?Judgment:  Good  ?Impulse Control: Good  ? ?Risk Assessment: ?Danger to Self:  No ?Self-injurious Behavior: No ?Danger to Others: No ?Duty to Warn:no ?Physical Aggression / Violence:No  ?Access to Firearms a concern: No  ?Gang Involvement:No  ? ?Subjective: Pt present for face-to-face individual therapy via video Webex.  Pt consents to telehealth video session due to COVID 19 pandemic. ?Location of pt: home  ?Location of therapist: home office.  ?Pt talked about getting a new puppy.   She is very happy about the new addition to their family.   ?Pt talked about her health.  She has caterac surgery in a couple of weeks.   She is in physical and speech therapy for her stroke rehab.  Pt is still having trouble with her balance.  Pt gets tired each day and takes a nap which she states helps her.  Pt is also trying to get out of the house and take walks. ?Pt's family has been supportive and helped her around the house.   ?Worked on self care strategies.  ?Provided supportive therapy.     ? ?Interventions: Cognitive Behavioral Therapy and Insight-Oriented ? ?Diagnosis: F43.23 ? ?Plan: See pt's Treatment Plan for depression and anxiety in Therapy Charts.  (Treatment Plan Target Date: 11/06/2021) ?Pt is progressing toward treatment goals.   ?Plan to continue to see pt every  two weeks.   ? ?Susan Natividad, LCSW ? ? ? ? ?

## 2021-08-01 ENCOUNTER — Encounter: Payer: Self-pay | Admitting: Ophthalmology

## 2021-08-01 ENCOUNTER — Telehealth: Payer: Self-pay | Admitting: Student

## 2021-08-01 DIAGNOSIS — I63412 Cerebral infarction due to embolism of left middle cerebral artery: Secondary | ICD-10-CM

## 2021-08-01 MED ORDER — TICAGRELOR 90 MG PO TABS: 90.0000 mg | ORAL_TABLET | Freq: Two times a day (BID) | ORAL | 2 refills | Status: DC

## 2021-08-01 NOTE — Telephone Encounter (Signed)
Refill of Brilinta 90mg  BID called into patient's preferred pharmacy.  ? ? , MS RD PA-C ?4:58 PM ? ? ?

## 2021-08-06 NOTE — Discharge Instructions (Signed)

## 2021-08-08 ENCOUNTER — Ambulatory Visit
Admission: RE | Admit: 2021-08-08 | Discharge: 2021-08-08 | Disposition: A | Discharge status: Home / Self Care | Payer: 59 | Attending: Ophthalmology | Admitting: Ophthalmology

## 2021-08-08 ENCOUNTER — Encounter: Payer: Self-pay | Admitting: Ophthalmology

## 2021-08-08 ENCOUNTER — Ambulatory Visit: Payer: 59 | Admitting: Anesthesiology

## 2021-08-08 ENCOUNTER — Encounter
Admission: RE | Disposition: A | Discharge status: Home / Self Care | Payer: Self-pay | Source: Home / Self Care | Attending: Ophthalmology

## 2021-08-08 ENCOUNTER — Other Ambulatory Visit: Payer: Self-pay

## 2021-08-08 DIAGNOSIS — Z87891 Personal history of nicotine dependence: Secondary | ICD-10-CM | POA: Diagnosis not present

## 2021-08-08 DIAGNOSIS — K219 Gastro-esophageal reflux disease without esophagitis: Secondary | ICD-10-CM | POA: Diagnosis not present

## 2021-08-08 DIAGNOSIS — I69320 Aphasia following cerebral infarction: Secondary | ICD-10-CM | POA: Insufficient documentation

## 2021-08-08 DIAGNOSIS — I69351 Hemiplegia and hemiparesis following cerebral infarction affecting right dominant side: Secondary | ICD-10-CM | POA: Insufficient documentation

## 2021-08-08 DIAGNOSIS — I10 Essential (primary) hypertension: Secondary | ICD-10-CM | POA: Diagnosis not present

## 2021-08-08 DIAGNOSIS — H2512 Age-related nuclear cataract, left eye: Secondary | ICD-10-CM | POA: Insufficient documentation

## 2021-08-08 HISTORY — DX: Weakness: R53.1

## 2021-08-08 HISTORY — DX: Cardiac murmur, unspecified: R01.1

## 2021-08-08 HISTORY — DX: Aphasia: R47.01

## 2021-08-08 HISTORY — PX: CATARACT EXTRACTION W/PHACO: SHX586

## 2021-08-08 SURGERY — PHACOEMULSIFICATION, CATARACT, WITH IOL INSERTION
Anesthesia: Monitor Anesthesia Care | Site: Eye | Laterality: Left

## 2021-08-08 MED ORDER — TETRACAINE HCL 0.5 % OP SOLN
1.0000 [drp] | OPHTHALMIC | Status: DC | PRN
  Administered 2021-08-08 (×3): 1 [drp] via OPHTHALMIC

## 2021-08-08 MED ORDER — SIGHTPATH DOSE#1 SODIUM HYALURONATE 23 MG/ML IO SOLUTION
PREFILLED_SYRINGE | INTRAOCULAR | Status: DC | PRN
  Administered 2021-08-08: 0.6 mL via INTRAOCULAR

## 2021-08-08 MED ORDER — ARMC OPHTHALMIC DILATING DROPS
1.0000 "application " | OPHTHALMIC | Status: DC | PRN
  Administered 2021-08-08 (×3): 1 via OPHTHALMIC

## 2021-08-08 MED ORDER — FENTANYL CITRATE (PF) 100 MCG/2ML IJ SOLN
INTRAMUSCULAR | Status: DC | PRN
  Administered 2021-08-08: 50 ug via INTRAVENOUS

## 2021-08-08 MED ORDER — SIGHTPATH DOSE#1 BSS IO SOLN
INTRAOCULAR | Status: DC | PRN
  Administered 2021-08-08: 15 mL

## 2021-08-08 MED ORDER — BALANCED SALT IO SOLN
INTRAOCULAR | Status: DC | PRN
Start: 2021-08-08 — End: 2021-08-08
  Administered 2021-08-08: 1 mL via INTRAOCULAR

## 2021-08-08 MED ORDER — SIGHTPATH DOSE#1 BSS IO SOLN
INTRAOCULAR | Status: DC | PRN
  Administered 2021-08-08: 77 mL via OPHTHALMIC

## 2021-08-08 MED ORDER — MIDAZOLAM HCL 2 MG/2ML IJ SOLN
INTRAMUSCULAR | Status: DC | PRN
  Administered 2021-08-08: 1 mg via INTRAVENOUS

## 2021-08-08 MED ORDER — MOXIFLOXACIN HCL 0.5 % OP SOLN
OPHTHALMIC | Status: DC | PRN
  Administered 2021-08-08: 0.2 mL via OPHTHALMIC

## 2021-08-08 MED ORDER — SIGHTPATH DOSE#1 SODIUM HYALURONATE 10 MG/ML IO SOLUTION
PREFILLED_SYRINGE | INTRAOCULAR | Status: DC | PRN
  Administered 2021-08-08: 0.85 mL via INTRAOCULAR

## 2021-08-08 SURGICAL SUPPLY — 14 items
CATARACT SUITE SIGHTPATH (MISCELLANEOUS) ×2 IMPLANT
DISSECTOR HYDRO NUCLEUS 50X22 (MISCELLANEOUS) ×2 IMPLANT
FEE CATARACT SUITE SIGHTPATH (MISCELLANEOUS) ×1 IMPLANT
GLOVE SURG GAMMEX PI TX LF 7.5 (GLOVE) ×2 IMPLANT
GLOVE SURG SYN 8.5  E (GLOVE) ×1
GLOVE SURG SYN 8.5 E (GLOVE) ×1 IMPLANT
GLOVE SURG SYN 8.5 PF PI (GLOVE) ×1 IMPLANT
LENS IOL TECNIS EYHANCE 20.5 (Intraocular Lens) ×1 IMPLANT
NDL FILTER BLUNT 18X1 1/2 (NEEDLE) ×1 IMPLANT
NEEDLE FILTER BLUNT 18X 1/2SAF (NEEDLE) ×1
NEEDLE FILTER BLUNT 18X1 1/2 (NEEDLE) ×1 IMPLANT
SYR 3ML LL SCALE MARK (SYRINGE) ×2 IMPLANT
SYR 5ML LL (SYRINGE) ×2 IMPLANT
WATER STERILE IRR 250ML POUR (IV SOLUTION) ×2 IMPLANT

## 2021-08-08 NOTE — Anesthesia Preprocedure Evaluation (Addendum)
Anesthesia Evaluation  Patient identified by MRN, date of birth, ID band Patient awake    History of Anesthesia Complications Negative for: history of anesthetic complications  Airway Mallampati: II  TM Distance: >3 FB Neck ROM: Full    Dental   Poor dentition :   Pulmonary Patient abstained from smoking., former smoker,    Pulmonary exam normal        Cardiovascular Exercise Tolerance: Good hypertension, Pt. on medications Normal cardiovascular exam     Neuro/Psych CVA (acute ischemic MCA stroke s/p intervention with residual expressive aphasia difficulties and R sided weakness. Taking Brilinta), Residual Symptoms    GI/Hepatic Neg liver ROS, GERD  Medicated,  Endo/Other  negative endocrine ROS  Renal/GU negative Renal ROS     Musculoskeletal   Abdominal   Peds  Hematology negative hematology ROS (+)   Anesthesia Other Findings   Reproductive/Obstetrics                            Anesthesia Physical Anesthesia Plan  ASA: 3  Anesthesia Plan: MAC   Post-op Pain Management: Minimal or no pain anticipated   Induction:   PONV Risk Score and Plan: 2 and TIVA, Midazolam and Treatment may vary due to age or medical condition  Airway Management Planned: Nasal Cannula and Natural Airway  Additional Equipment: None  Intra-op Plan:   Post-operative Plan:   Informed Consent: I have reviewed the patients History and Physical, chart, labs and discussed the procedure including the risks, benefits and alternatives for the proposed anesthesia with the patient or authorized representative who has indicated his/her understanding and acceptance.       Plan Discussed with: CRNA  Anesthesia Plan Comments:         Anesthesia Quick Evaluation

## 2021-08-08 NOTE — Anesthesia Postprocedure Evaluation (Signed)
Anesthesia Post Note  [name]  Procedure(s) Performed: CATARACT EXTRACTION PHACO AND INTRAOCULAR LENS PLACEMENT (IOC) LEFT (Left: Eye)     Patient location during evaluation: PACU Anesthesia Type: MAC Level of consciousness: awake and alert Pain management: pain level controlled Vital Signs Assessment: post-procedure vital signs reviewed and stable Respiratory status: spontaneous breathing, nonlabored ventilation, respiratory function stable and patient connected to nasal cannula oxygen Cardiovascular status: stable and blood pressure returned to baseline Postop Assessment: no apparent nausea or vomiting Anesthetic complications: no   No notable events documented.  Adele Barthel Kaula Klenke

## 2021-08-08 NOTE — Transfer of Care (Signed)
Immediate Anesthesia Transfer of Care Note  [name]  Procedure(s) Performed: CATARACT EXTRACTION PHACO AND INTRAOCULAR LENS PLACEMENT (IOC) LEFT (Left: Eye)  Patient Location: PACU  Anesthesia Type: MAC  Level of Consciousness: awake, alert  and patient cooperative  Airway and Oxygen Therapy: Patient Spontanous Breathing and Patient connected to supplemental oxygen  Post-op Assessment: Post-op Vital signs reviewed, Patient's Cardiovascular Status Stable, Respiratory Function Stable, Patent Airway and No signs of Nausea or vomiting  Post-op Vital Signs: Reviewed and stable  Complications: No notable events documented.

## 2021-08-08 NOTE — Op Note (Signed)
OPERATIVE NOTE  Susan Lewis [id number] 08/08/2021   PREOPERATIVE DIAGNOSIS:  Nuclear sclerotic cataract left eye.  H25.12   POSTOPERATIVE DIAGNOSIS:    Nuclear sclerotic cataract left eye.     PROCEDURE:  Phacoemusification with posterior chamber intraocular lens placement of the left eye   LENS:   Implant Name Type Inv. Item Serial No. Manufacturer Lot No. LRB No. Used Action  LENS IOL TECNIS EYHANCE 20.5 - [id number] Intraocular Lens LENS IOL TECNIS EYHANCE 20.5 [id number] [employer]  Right 1 Implanted      Procedure(s) with comments: CATARACT EXTRACTION PHACO AND INTRAOCULAR LENS PLACEMENT (IOC) LEFT (Left) - 2.35 0:17.4  DIB00 +20.5   ULTRASOUND TIME: 0 minutes 17 seconds.  CDE 2.35   SURGEON:  Willey Blade, MD, MPH   ANESTHESIA:  Topical with tetracaine drops augmented with 1% preservative-free intracameral lidocaine.  ESTIMATED BLOOD LOSS: <1 mL   COMPLICATIONS:  None.   DESCRIPTION OF PROCEDURE:  The patient was identified in the holding room and transported to the operating room and placed in the supine position under the operating microscope.  The left eye was identified as the operative eye and it was prepped and draped in the usual sterile ophthalmic fashion.   A 1.0 millimeter clear-corneal paracentesis was made at the 5:00 position. 0.5 ml of preservative-free 1% lidocaine with epinephrine was injected into the anterior chamber.  The anterior chamber was filled with Healon 5 viscoelastic.  A 2.4 millimeter keratome was used to make a near-clear corneal incision at the 2:00 position.  A curvilinear capsulorrhexis was made with a cystotome and capsulorrhexis forceps.  Balanced salt solution was used to hydrodissect and hydrodelineate the nucleus.   Phacoemulsification was then used in stop and chop fashion to remove the lens nucleus and epinucleus.  The remaining cortex was then removed using the irrigation and aspiration handpiece. Healon was then placed into the  capsular bag to distend it for lens placement.  A lens was then injected into the capsular bag.  The remaining viscoelastic was aspirated.   Wounds were hydrated with balanced salt solution.  The anterior chamber was inflated to a physiologic pressure with balanced salt solution.  Intracameral vigamox 0.1 mL undiltued was injected into the eye and a drop placed onto the ocular surface.  No wound leaks were noted.  The patient was taken to the recovery room in stable condition without complications of anesthesia or surgery  Willey Blade 08/08/2021, 11:01 AM

## 2021-08-08 NOTE — H&P (Signed)
[facility]   Primary Care Physician:  Loyola Mast, MD Ophthalmologist: Dr. Willey Blade  Pre-Procedure History & Physical: HPI:  Susan Lewis is a 61 y.o. female here for cataract surgery.   Past Medical History:  Diagnosis Date   Anxiety    Arthritis    Bursitis    Carpal tunnel syndrome    COPD (chronic obstructive pulmonary disease) (HCC)    Depression    Expressive aphasia    Difficulty "finding words" since stroke 06/12/21   Fibromyalgia    GERD (gastroesophageal reflux disease)    Heart murmur    "all my life".  followed by PCP   Hemorrhoids 10/27/2014   Hyperlipidemia    Hypertension    Kidney stones    Right sided weakness    S/P stroke 06/12/21   Stroke (HCC) 06/12/2021    Past Surgical History:  Procedure Laterality Date   CARPAL TUNNEL RELEASE Right    carpel tunnel    CESAREAN SECTION     CHOLECYSTECTOMY     ENDOMETRIAL ABLATION     EXCISIONAL HEMORRHOIDECTOMY     IR ANGIO EXTRACRAN SEL COM CAROTID INNOMINATE UNI R MOD SED  06/12/2021   IR CT HEAD LTD  06/12/2021   IR CT HEAD LTD  06/12/2021   IR INTRAVSC STENT CERV CAROTID W/O EMB-PROT MOD SED INC ANGIO  06/12/2021   IR PERCUTANEOUS ART THROMBECTOMY/INFUSION INTRACRANIAL INC DIAG ANGIO  06/12/2021   OOPHORECTOMY Left    ORIF ANKLE FRACTURE BIMALLEOLAR     RADIOLOGY WITH ANESTHESIA N/A 06/12/2021   Procedure: IR WITH ANESTHESIA;  Surgeon: Radiologist, Medication, MD;  Location: [facility];  Service: Radiology;  Laterality: N/A;    Prior to Admission medications   Medication Sig Start Date End Date Taking? Authorizing Provider  acetaminophen (TYLENOL) 325 MG tablet Take 2 tablets (650 mg total) by mouth every 4 (four) hours as needed for mild pain (or temp > 37.5 C (99.5 F)). 06/21/21  Yes Angiulli, Mcarthur Rossetti, PA-C  albuterol (VENTOLIN HFA) 108 (90 Base) MCG/ACT inhaler INHALE 2 PUFFS INTO THE LUNGS EVERY 6 HOURS AS NEEDED FOR UP TO 30 DAYS FOR WHEEZING 06/21/21  Yes Angiulli, Mcarthur Rossetti, PA-C  amLODipine  (NORVASC) 5 MG tablet Take 1 tablet (5 mg total) by mouth daily. 07/23/21 07/18/22 Yes Loyola Mast, MD  aspirin 81 MG chewable tablet Chew 1 tablet (81 mg total) by mouth daily. 06/16/21 08/08/21 Yes Princess Bruins, DO  atorvastatin (LIPITOR) 20 MG tablet Take 1 tablet (20 mg total) by mouth at bedtime. 07/23/21 07/18/22 Yes RuddBertram Millard, MD  azelastine (ASTELIN) 0.1 % nasal spray Place 1 spray into both nostrils 2 (two) times daily. Use in each nostril as directed   Yes [provider]  busPIRone (BUSPAR) 15 MG tablet Take 1 tablet (15 mg total) by mouth 2 (two) times daily. 06/21/21  Yes Angiulli, Mcarthur Rossetti, PA-C  calcium carbonate (TUMS - DOSED IN MG ELEMENTAL CALCIUM) 500 MG chewable tablet Chew 1 tablet (200 mg of elemental calcium total) by mouth 3 (three) times daily. 06/21/21  Yes Angiulli, Mcarthur Rossetti, PA-C  fluticasone (FLONASE) 50 MCG/ACT nasal spray Place 1 spray into both nostrils daily. 04/23/21  Yes Loyola Mast, MD  gabapentin (NEURONTIN) 300 MG capsule Take 1 capsule (300 mg total) by mouth 2 (two) times daily. 06/21/21  Yes Angiulli, Mcarthur Rossetti, PA-C  hydrOXYzine (ATARAX) 25 MG tablet Take 1 tablet (25 mg total) by mouth 3 (three) times daily as  needed for anxiety. 06/21/21  Yes Angiulli, Mcarthur Rossetti, PA-C  meloxicam (MOBIC) 15 MG tablet Take 15 mg by mouth daily. 07/10/21  Yes [provider]  omeprazole (PRILOSEC) 40 MG capsule Take 1 capsule (40 mg total) by mouth daily. 07/23/21  Yes Loyola Mast, MD  oxybutynin (DITROPAN) 5 MG tablet Take 1 tablet (5 mg total) by mouth 3 (three) times daily. 06/21/21  Yes Angiulli, Mcarthur Rossetti, PA-C  sertraline (ZOLOFT) 100 MG tablet Take 1.5 tablets (150 mg total) by mouth daily. 07/23/21 07/18/22 Yes RuddBertram Millard, MD  solifenacin (VESICARE) 5 MG tablet Take 1 tablet (5 mg total) by mouth daily. 06/21/21  Yes Angiulli, Mcarthur Rossetti, PA-C  ticagrelor (BRILINTA) 90 MG TABS tablet Take 1 tablet (90 mg total) by mouth 2 (two) times daily. 08/01/21  Yes Hoyt Koch, PA  traMADol (ULTRAM) 50 MG tablet Take 1 tablet (50 mg total) by mouth every 12 (twelve) hours as needed. 07/23/21  Yes Loyola Mast, MD  traZODone (DESYREL) 150 MG tablet Take 1 tablet (150 mg total) by mouth at bedtime. 07/23/21  Yes Loyola Mast, MD    Allergies as of 07/12/2021 - Review Complete 07/11/2021  Allergen Reaction Noted   Morphine Other (See Comments) 10/07/2013   Codeine Itching 10/07/2013    History reviewed. No pertinent family history.  Social History   Socioeconomic History   Marital status: Married    Spouse name: Not on file   Number of children: 2   Years of education: Not on file   Highest education level: Not on file  Occupational History   Occupation: Laundry mat attendant  Tobacco Use   Smoking status: Former    Packs/day: 1.00    Years: 45.00    Pack years: 45.00    Types: Cigarettes    Start date: 08/28/1971    Quit date: 06/12/2021    Years since quitting: 0.1   Smokeless tobacco: Never  Vaping Use   Vaping Use: Never used  Substance and Sexual Activity   Alcohol use: No   Drug use: Not Currently    Types: Marijuana    Comment: Frequent, daily use. none since stroke 06/12/21   Sexual activity: Yes  Other Topics Concern   Not on file  Social History Narrative   Right handed   Hot tea   One story home   Social Determinants of Health   Financial Resource Strain: Not on file  Food Insecurity: Not on file  Transportation Needs: Not on file  Physical Activity: Not on file  Stress: Not on file  Social Connections: Not on file  Intimate Partner Violence: Not on file    Review of Systems: See HPI, otherwise negative ROS  Physical Exam: BP (!) 139/97   Pulse 80   Temp (!) 97.1 F (36.2 C) (Temporal)   Resp 16   Ht 5\' 5"  (1.651 m)   Wt 64 kg   SpO2 99%   BMI 23.50 kg/m  General:   Alert, cooperative in NAD Head:  Normocephalic and atraumatic. Respiratory:  Normal work of breathing. Cardiovascular:   RRR  Impression/Plan: Susan Lewis is here for cataract surgery.  Risks, benefits, limitations, and alternatives regarding cataract surgery have been reviewed with the patient.  Questions have been answered.  All parties agreeable.   Standley Dakins, MD  08/08/2021, 10:35 AM

## 2021-08-08 NOTE — Anesthesia Procedure Notes (Signed)
Procedure Name: MAC Date/Time: 08/08/2021 10:45 AM Performed by: Jeannene Patella, CRNA Pre-anesthesia Checklist: Patient identified, Emergency Drugs available, Suction available, Timeout performed and Patient being monitored Patient Re-evaluated:Patient Re-evaluated prior to induction Oxygen Delivery Method: Nasal cannula Placement Confirmation: positive ETCO2

## 2021-08-09 ENCOUNTER — Encounter: Payer: Self-pay | Admitting: Ophthalmology

## 2021-08-14 ENCOUNTER — Encounter: Payer: Self-pay | Admitting: Ophthalmology

## 2021-08-16 NOTE — Discharge Instructions (Signed)

## 2021-08-20 ENCOUNTER — Ambulatory Visit
Admission: RE | Admit: 2021-08-20 | Discharge: 2021-08-20 | Disposition: A | Discharge status: Home / Self Care | Payer: 59 | Attending: Ophthalmology | Admitting: Ophthalmology

## 2021-08-20 ENCOUNTER — Encounter: Payer: Self-pay | Admitting: Ophthalmology

## 2021-08-20 ENCOUNTER — Other Ambulatory Visit: Payer: Self-pay

## 2021-08-20 ENCOUNTER — Encounter
Admission: RE | Disposition: A | Discharge status: Home / Self Care | Payer: Self-pay | Source: Home / Self Care | Attending: Ophthalmology

## 2021-08-20 ENCOUNTER — Ambulatory Visit: Payer: 59 | Admitting: Anesthesiology

## 2021-08-20 DIAGNOSIS — M1990 Unspecified osteoarthritis, unspecified site: Secondary | ICD-10-CM | POA: Diagnosis not present

## 2021-08-20 DIAGNOSIS — H2511 Age-related nuclear cataract, right eye: Secondary | ICD-10-CM | POA: Insufficient documentation

## 2021-08-20 DIAGNOSIS — Z8673 Personal history of transient ischemic attack (TIA), and cerebral infarction without residual deficits: Secondary | ICD-10-CM | POA: Insufficient documentation

## 2021-08-20 DIAGNOSIS — F4310 Post-traumatic stress disorder, unspecified: Secondary | ICD-10-CM | POA: Diagnosis not present

## 2021-08-20 DIAGNOSIS — M797 Fibromyalgia: Secondary | ICD-10-CM | POA: Insufficient documentation

## 2021-08-20 DIAGNOSIS — K219 Gastro-esophageal reflux disease without esophagitis: Secondary | ICD-10-CM | POA: Insufficient documentation

## 2021-08-20 DIAGNOSIS — J449 Chronic obstructive pulmonary disease, unspecified: Secondary | ICD-10-CM | POA: Diagnosis not present

## 2021-08-20 DIAGNOSIS — F419 Anxiety disorder, unspecified: Secondary | ICD-10-CM | POA: Diagnosis not present

## 2021-08-20 DIAGNOSIS — Z87891 Personal history of nicotine dependence: Secondary | ICD-10-CM | POA: Diagnosis not present

## 2021-08-20 HISTORY — PX: CATARACT EXTRACTION W/PHACO: SHX586

## 2021-08-20 SURGERY — PHACOEMULSIFICATION, CATARACT, WITH IOL INSERTION
Anesthesia: Monitor Anesthesia Care | Site: Eye | Laterality: Right

## 2021-08-20 MED ORDER — SIGHTPATH DOSE#1 BSS IO SOLN
INTRAOCULAR | Status: DC | PRN
  Administered 2021-08-20: 15 mL

## 2021-08-20 MED ORDER — MIDAZOLAM HCL 2 MG/2ML IJ SOLN
INTRAMUSCULAR | Status: DC | PRN
  Administered 2021-08-20: 1 mg via INTRAVENOUS

## 2021-08-20 MED ORDER — SIGHTPATH DOSE#1 SODIUM HYALURONATE 23 MG/ML IO SOLUTION
PREFILLED_SYRINGE | INTRAOCULAR | Status: DC | PRN
  Administered 2021-08-20: 0.6 mL via INTRAOCULAR

## 2021-08-20 MED ORDER — SIGHTPATH DOSE#1 SODIUM HYALURONATE 10 MG/ML IO SOLUTION
PREFILLED_SYRINGE | INTRAOCULAR | Status: DC | PRN
  Administered 2021-08-20: 0.85 mL via INTRAOCULAR

## 2021-08-20 MED ORDER — SIGHTPATH DOSE#1 BSS IO SOLN
INTRAOCULAR | Status: DC | PRN
  Administered 2021-08-20: 64 mL via OPHTHALMIC

## 2021-08-20 MED ORDER — MOXIFLOXACIN HCL 0.5 % OP SOLN
OPHTHALMIC | Status: DC | PRN
  Administered 2021-08-20: 0.2 mL via OPHTHALMIC

## 2021-08-20 MED ORDER — OXYCODONE HCL 5 MG PO TABS: 5.0000 mg | ORAL_TABLET | Freq: Once | ORAL | Status: DC | PRN

## 2021-08-20 MED ORDER — LIDOCAINE HCL (PF) 2 % IJ SOLN
INTRAOCULAR | Status: DC | PRN
  Administered 2021-08-20: 1 mL via INTRAOCULAR

## 2021-08-20 MED ORDER — FENTANYL CITRATE (PF) 100 MCG/2ML IJ SOLN
INTRAMUSCULAR | Status: DC | PRN
Start: 2021-08-20 — End: 2021-08-20
  Administered 2021-08-20: 50 ug via INTRAVENOUS

## 2021-08-20 MED ORDER — LACTATED RINGERS IV SOLN: INTRAVENOUS | Status: DC

## 2021-08-20 MED ORDER — TETRACAINE HCL 0.5 % OP SOLN
1.0000 [drp] | OPHTHALMIC | Status: DC | PRN
Start: 2021-08-20 — End: 2021-08-20
  Administered 2021-08-20 (×3): 1 [drp] via OPHTHALMIC

## 2021-08-20 MED ORDER — ARMC OPHTHALMIC DILATING DROPS
1.0000 "application " | OPHTHALMIC | Status: DC | PRN
  Administered 2021-08-20 (×3): 1 via OPHTHALMIC

## 2021-08-20 MED ORDER — OXYCODONE HCL 5 MG/5ML PO SOLN: 5.0000 mg | Freq: Once | ORAL | Status: DC | PRN

## 2021-08-20 SURGICAL SUPPLY — 20 items
CANNULA ANT/CHMB 27G (MISCELLANEOUS) IMPLANT
CANNULA ANT/CHMB 27GA (MISCELLANEOUS) IMPLANT
CATARACT SUITE SIGHTPATH (MISCELLANEOUS) ×2 IMPLANT
DISSECTOR HYDRO NUCLEUS 50X22 (MISCELLANEOUS) ×2 IMPLANT
FEE CATARACT SUITE SIGHTPATH (MISCELLANEOUS) ×1 IMPLANT
GLOVE SURG GAMMEX PI TX LF 7.5 (GLOVE) ×2 IMPLANT
GLOVE SURG SYN 8.5  E (GLOVE) ×1
GLOVE SURG SYN 8.5 E (GLOVE) ×1 IMPLANT
GLOVE SURG SYN 8.5 PF PI (GLOVE) ×1 IMPLANT
LENS IOL TECNIS EYHANCE 21.0 (Intraocular Lens) ×1 IMPLANT
NDL FILTER BLUNT 18X1 1/2 (NEEDLE) ×1 IMPLANT
NEEDLE FILTER BLUNT 18X 1/2SAF (NEEDLE) ×1
NEEDLE FILTER BLUNT 18X1 1/2 (NEEDLE) ×1 IMPLANT
PACK VIT ANT 23G (MISCELLANEOUS) IMPLANT
RING MALYGIN (MISCELLANEOUS) IMPLANT
SUT ETHILON 10-0 CS-B-6CS-B-6 (SUTURE) ×2
SUTURE EHLN 10-0 CS-B-6CS-B-6 (SUTURE) IMPLANT
SYR 3ML LL SCALE MARK (SYRINGE) ×2 IMPLANT
SYR 5ML LL (SYRINGE) ×2 IMPLANT
WATER STERILE IRR 250ML POUR (IV SOLUTION) ×2 IMPLANT

## 2021-08-20 NOTE — H&P (Signed)
[facility]   Primary Care Physician:  Loyola Mast, MD Ophthalmologist: Dr. Willey Blade  Pre-Procedure History & Physical: HPI:  Susan Lewis is a 61 y.o. female here for cataract surgery.   Past Medical History:  Diagnosis Date   Anxiety    Arthritis    Bursitis    Carpal tunnel syndrome    COPD (chronic obstructive pulmonary disease) (HCC)    Depression    Expressive aphasia    Difficulty "finding words" since stroke 06/12/21   Fibromyalgia    GERD (gastroesophageal reflux disease)    Heart murmur    "all my life".  followed by PCP   Hemorrhoids 10/27/2014   Hyperlipidemia    Hypertension    Kidney stones    Right sided weakness    S/P stroke 06/12/21   Stroke (HCC) 06/12/2021    Past Surgical History:  Procedure Laterality Date   CARPAL TUNNEL RELEASE Right    carpel tunnel    CATARACT EXTRACTION W/PHACO Left 08/08/2021   Procedure: CATARACT EXTRACTION PHACO AND INTRAOCULAR LENS PLACEMENT (IOC) LEFT;  Surgeon: Nevada Crane, MD;  Location: [facility];  Service: Ophthalmology;  Laterality: Left;  2.35 0:17.4   CESAREAN SECTION     CHOLECYSTECTOMY     ENDOMETRIAL ABLATION     EXCISIONAL HEMORRHOIDECTOMY     IR ANGIO EXTRACRAN SEL COM CAROTID INNOMINATE UNI R MOD SED  06/12/2021   IR CT HEAD LTD  06/12/2021   IR CT HEAD LTD  06/12/2021   IR INTRAVSC STENT CERV CAROTID W/O EMB-PROT MOD SED INC ANGIO  06/12/2021   IR PERCUTANEOUS ART THROMBECTOMY/INFUSION INTRACRANIAL INC DIAG ANGIO  06/12/2021   OOPHORECTOMY Left    ORIF ANKLE FRACTURE BIMALLEOLAR     RADIOLOGY WITH ANESTHESIA N/A 06/12/2021   Procedure: IR WITH ANESTHESIA;  Surgeon: Radiologist, Medication, MD;  Location: [facility] OR;  Service: Radiology;  Laterality: N/A;    Prior to Admission medications   Medication Sig Start Date End Date Taking? Authorizing Provider  amLODipine (NORVASC) 5 MG tablet Take 1 tablet (5 mg total) by mouth daily. 07/23/21 07/18/22 Yes Loyola Mast, MD  aspirin 81 MG  chewable tablet Chew 1 tablet (81 mg total) by mouth daily. 06/16/21 08/20/21 Yes Princess Bruins, DO  atorvastatin (LIPITOR) 20 MG tablet Take 1 tablet (20 mg total) by mouth at bedtime. 07/23/21 07/18/22 Yes RuddBertram Millard, MD  busPIRone (BUSPAR) 15 MG tablet Take 1 tablet (15 mg total) by mouth 2 (two) times daily. 06/21/21  Yes Angiulli, Mcarthur Rossetti, PA-C  fluticasone (FLONASE) 50 MCG/ACT nasal spray Place 1 spray into both nostrils daily. 04/23/21  Yes Loyola Mast, MD  gabapentin (NEURONTIN) 300 MG capsule Take 1 capsule (300 mg total) by mouth 2 (two) times daily. 06/21/21  Yes Angiulli, Mcarthur Rossetti, PA-C  hydrOXYzine (ATARAX) 25 MG tablet Take 1 tablet (25 mg total) by mouth 3 (three) times daily as needed for anxiety. 06/21/21  Yes Angiulli, Mcarthur Rossetti, PA-C  meloxicam (MOBIC) 15 MG tablet Take 15 mg by mouth daily. 07/10/21  Yes [provider]  omeprazole (PRILOSEC) 40 MG capsule Take 1 capsule (40 mg total) by mouth daily. 07/23/21  Yes Loyola Mast, MD  oxybutynin (DITROPAN) 5 MG tablet Take 1 tablet (5 mg total) by mouth 3 (three) times daily. 06/21/21  Yes Angiulli, Mcarthur Rossetti, PA-C  sertraline (ZOLOFT) 100 MG tablet Take 1.5 tablets (150 mg total) by mouth daily. 07/23/21 07/18/22 Yes Loyola Mast, MD  solifenacin (VESICARE) 5 MG tablet Take 1 tablet (5 mg total) by mouth daily. 06/21/21  Yes Angiulli, Mcarthur Rossetti, PA-C  ticagrelor (BRILINTA) 90 MG TABS tablet Take 1 tablet (90 mg total) by mouth 2 (two) times daily. 08/01/21  Yes Hoyt Koch, PA  traMADol (ULTRAM) 50 MG tablet Take 1 tablet (50 mg total) by mouth every 12 (twelve) hours as needed. 07/23/21  Yes Loyola Mast, MD  traZODone (DESYREL) 150 MG tablet Take 1 tablet (150 mg total) by mouth at bedtime. 07/23/21  Yes Loyola Mast, MD  acetaminophen (TYLENOL) 325 MG tablet Take 2 tablets (650 mg total) by mouth every 4 (four) hours as needed for mild pain (or temp > 37.5 C (99.5 F)). 06/21/21   Angiulli, Mcarthur Rossetti, PA-C  albuterol  (VENTOLIN HFA) 108 (90 Base) MCG/ACT inhaler INHALE 2 PUFFS INTO THE LUNGS EVERY 6 HOURS AS NEEDED FOR UP TO 30 DAYS FOR WHEEZING 06/21/21   Angiulli, Mcarthur Rossetti, PA-C  azelastine (ASTELIN) 0.1 % nasal spray Place 1 spray into both nostrils 2 (two) times daily. Use in each nostril as directed    [provider]  calcium carbonate (TUMS - DOSED IN MG ELEMENTAL CALCIUM) 500 MG chewable tablet Chew 1 tablet (200 mg of elemental calcium total) by mouth 3 (three) times daily. 06/21/21   Angiulli, Mcarthur Rossetti, PA-C    Allergies as of 07/12/2021 - Review Complete 07/11/2021  Allergen Reaction Noted   Morphine Other (See Comments) 10/07/2013   Codeine Itching 10/07/2013    History reviewed. No pertinent family history.  Social History   Socioeconomic History   Marital status: Married    Spouse name: Not on file   Number of children: 2   Years of education: Not on file   Highest education level: Not on file  Occupational History   Occupation: Laundry mat attendant  Tobacco Use   Smoking status: Former    Packs/day: 1.00    Years: 45.00    Pack years: 45.00    Types: Cigarettes    Start date: 08/28/1971    Quit date: 06/12/2021    Years since quitting: 0.1   Smokeless tobacco: Never  Vaping Use   Vaping Use: Never used  Substance and Sexual Activity   Alcohol use: No   Drug use: Not Currently    Types: Marijuana    Comment: Frequent, daily use. none since stroke 06/12/21   Sexual activity: Yes  Other Topics Concern   Not on file  Social History Narrative   Right handed   Hot tea   One story home   Social Determinants of Health   Financial Resource Strain: Not on file  Food Insecurity: Not on file  Transportation Needs: Not on file  Physical Activity: Not on file  Stress: Not on file  Social Connections: Not on file  Intimate Partner Violence: Not on file    Review of Systems: See HPI, otherwise negative ROS  Physical Exam: BP (!) 158/103   Pulse (!) 57   Temp 97.8  F (36.6 C) (Temporal)   Resp 12   Ht 5\' 5"  (1.651 m)   Wt 63.5 kg   SpO2 100%   BMI 23.30 kg/m  General:   Alert, cooperative in NAD Head:  Normocephalic and atraumatic. Respiratory:  Normal work of breathing. Cardiovascular:  RRR  Impression/Plan: Susan Lewis is here for cataract surgery.  Risks, benefits, limitations, and alternatives regarding cataract surgery have been reviewed with the patient.  Questions  have been answered.  All parties agreeable.   Willey BladeBradley Eshaan Titzer, MD  08/20/2021, 11:55 AM

## 2021-08-20 NOTE — Anesthesia Postprocedure Evaluation (Signed)
Anesthesia Post Note  [name]  Procedure(s) Performed: CATARACT EXTRACTION PHACO AND INTRAOCULAR LENS PLACEMENT (IOC) RIGHT (Right: Eye)     Patient location during evaluation: PACU Anesthesia Type: MAC Level of consciousness: awake and alert Pain management: pain level controlled Vital Signs Assessment: post-procedure vital signs reviewed and stable Respiratory status: spontaneous breathing, nonlabored ventilation, respiratory function stable and patient connected to nasal cannula oxygen Cardiovascular status: stable and blood pressure returned to baseline Postop Assessment: no apparent nausea or vomiting Anesthetic complications: no   No notable events documented.  [name]

## 2021-08-20 NOTE — Op Note (Signed)
OPERATIVE NOTE  Aliana Laverne [id number] 08/20/2021   PREOPERATIVE DIAGNOSIS:  Nuclear sclerotic cataract right eye.  H25.11   POSTOPERATIVE DIAGNOSIS:    Nuclear sclerotic cataract right eye.     PROCEDURE:  Phacoemusification with posterior chamber intraocular lens placement of the right eye   LENS:   Implant Name Type Inv. Item Serial No. Manufacturer Lot No. LRB No. Used Action  LENS IOL TECNIS EYHANCE 21.0 - [id number] Intraocular Lens LENS IOL TECNIS EYHANCE 21.0 [id number] [employer]  Right 1 Implanted       Procedure(s) with comments: CATARACT EXTRACTION PHACO AND INTRAOCULAR LENS PLACEMENT (IOC) RIGHT (Right) - 2.63 0:25.5  DIB00 +21.0   ULTRASOUND TIME: 0 minutes 25 seconds.  CDE 2.63   SURGEON:  Benay Pillow, MD, MPH  ANESTHESIOLOGIST: Anesthesiologist: [name], MD CRNA: Garner Nash, CRNA   ANESTHESIA:  Topical with tetracaine drops augmented with 1% preservative-free intracameral lidocaine.  ESTIMATED BLOOD LOSS: less than 1 mL.   COMPLICATIONS:  None.   DESCRIPTION OF PROCEDURE:  The patient was identified in the holding room and transported to the operating room and placed in the supine position under the operating microscope.  The right eye was identified as the operative eye and it was prepped and draped in the usual sterile ophthalmic fashion.   A 1.0 millimeter clear-corneal paracentesis was made at the 10:30 position.   The patient was exhibited some uncontrollable/involuntary eye movements, the paracentesis wound was very diffcult to access and a second paractentesis was made near the first.  0.5 ml of preservative-free 1% lidocaine with epinephrine was injected into the anterior chamber.  The anterior chamber was filled with Healon 5 viscoelastic.  A 2.4 millimeter keratome was used to make a near-clear corneal incision at the 8:00 position.  A curvilinear capsulorrhexis was made with a cystotome and capsulorrhexis forceps.  Balanced salt  solution was used to hydrodissect and hydrodelineate the nucleus.   Phacoemulsification was then used in stop and chop fashion to remove the lens nucleus and epinucleus.  The remaining cortex was then removed using the irrigation and aspiration handpiece. Healon was then placed into the capsular bag to distend it for lens placement.  A lens was then injected into the capsular bag.  The remaining viscoelastic was aspirated.   Wounds were hydrated with balanced salt solution.  The anterior chamber was inflated to a physiologic pressure with balanced salt solution.  The paracentesis wound was leaking, so a 10-0 nylon suture was placed.  The first suture was too tight and was removed and replaced.  Intracameral vigamox 0.1 mL undiluted was injected into the eye and a drop placed onto the ocular surface.  No wound leaks were noted.  The patient was taken to the recovery room in stable condition without complications of anesthesia or surgery  Benay Pillow 08/20/2021, 12:41 PM

## 2021-08-20 NOTE — Anesthesia Preprocedure Evaluation (Addendum)
Anesthesia Evaluation  Patient identified by MRN, date of birth, ID band Patient awake    Reviewed: NPO status   History of Anesthesia Complications Negative for: history of anesthetic complications  Airway Mallampati: II  TM Distance: >3 FB Neck ROM: Full    Dental  (+) Poor Dentition, Chipped, Missing,  Poor dentition :   Pulmonary COPD (mild), Patient abstained from smoking., former smoker (quit 05/2021),    Pulmonary exam normal        Cardiovascular Exercise Tolerance: Good hypertension, Normal cardiovascular exam     Neuro/Psych PSYCHIATRIC DISORDERS (ptsd) Anxiety Expressive aphasia;  Difficulty "finding words" since stroke 06/12/21 CVA (05/2021 : acute ischemic MCA stroke s/p intervention with residual expressive aphasia difficulties and R sided weakness. Taking Brilinta/.), Residual Symptoms    GI/Hepatic GERD  Controlled,(+)     substance abuse  marijuana use,   Endo/Other  negative endocrine ROS  Renal/GU negative Renal ROS  negative genitourinary   Musculoskeletal  (+) Arthritis  (ddd), Fibromyalgia -  Abdominal   Peds  Hematology negative hematology ROS (+)   Anesthesia Other Findings echo: 05/2021:ef=70%; mild aortic valve stenosis. Aortic  valve mean gradient measures 15.0 mmHg. ;  had ecce on 08/08/2001;  neuro: 06/2021: Tomi Likens;  last ticagrelor : 6/5;  Bilat, multiple arm bruises.  Reproductive/Obstetrics                            Anesthesia Physical  Anesthesia Plan  ASA: 3  Anesthesia Plan: MAC   Post-op Pain Management: Minimal or no pain anticipated   Induction:   PONV Risk Score and Plan: 2 and TIVA and Midazolam  Airway Management Planned: Nasal Cannula and Natural Airway  Additional Equipment: None  Intra-op Plan:   Post-operative Plan:   Informed Consent: I have reviewed the patients History and Physical, chart, labs and discussed the  procedure including the risks, benefits and alternatives for the proposed anesthesia with the patient or authorized representative who has indicated his/her understanding and acceptance.       Plan Discussed with: CRNA  Anesthesia Plan Comments:         Anesthesia Quick Evaluation

## 2021-08-20 NOTE — Transfer of Care (Signed)
Immediate Anesthesia Transfer of Care Note  [name]  Procedure(s) Performed: CATARACT EXTRACTION PHACO AND INTRAOCULAR LENS PLACEMENT (IOC) RIGHT (Right: Eye)  Patient Location: PACU  Anesthesia Type: MAC  Level of Consciousness: awake, alert  and patient cooperative  Airway and Oxygen Therapy: Patient Spontanous Breathing and Patient connected to supplemental oxygen  Post-op Assessment: Post-op Vital signs reviewed, Patient's Cardiovascular Status Stable, Respiratory Function Stable, Patent Airway and No signs of Nausea or vomiting  Post-op Vital Signs: Reviewed and stable  Complications: No notable events documented.

## 2021-08-21 ENCOUNTER — Encounter: Payer: 59 | Admitting: Physical Medicine & Rehabilitation

## 2021-08-21 ENCOUNTER — Encounter: Payer: Self-pay | Admitting: Ophthalmology

## 2021-08-23 ENCOUNTER — Ambulatory Visit: Payer: 59 | Admitting: Speech Pathology

## 2021-08-23 ENCOUNTER — Encounter: Payer: Self-pay | Admitting: Speech Pathology

## 2021-08-23 ENCOUNTER — Ambulatory Visit: Payer: 59 | Attending: Physician Assistant | Admitting: Occupational Therapy

## 2021-08-23 DIAGNOSIS — R4701 Aphasia: Secondary | ICD-10-CM | POA: Diagnosis present

## 2021-08-23 DIAGNOSIS — I63512 Cerebral infarction due to unspecified occlusion or stenosis of left middle cerebral artery: Secondary | ICD-10-CM | POA: Insufficient documentation

## 2021-08-23 DIAGNOSIS — Z9181 History of falling: Secondary | ICD-10-CM | POA: Diagnosis present

## 2021-08-23 DIAGNOSIS — R208 Other disturbances of skin sensation: Secondary | ICD-10-CM | POA: Insufficient documentation

## 2021-08-23 DIAGNOSIS — M6281 Muscle weakness (generalized): Secondary | ICD-10-CM | POA: Insufficient documentation

## 2021-08-23 DIAGNOSIS — R278 Other lack of coordination: Secondary | ICD-10-CM | POA: Diagnosis present

## 2021-08-23 DIAGNOSIS — F985 Adult onset fluency disorder: Secondary | ICD-10-CM | POA: Diagnosis present

## 2021-08-23 DIAGNOSIS — I69351 Hemiplegia and hemiparesis following cerebral infarction affecting right dominant side: Secondary | ICD-10-CM | POA: Insufficient documentation

## 2021-08-23 DIAGNOSIS — R2681 Unsteadiness on feet: Secondary | ICD-10-CM | POA: Diagnosis present

## 2021-08-23 DIAGNOSIS — R2689 Other abnormalities of gait and mobility: Secondary | ICD-10-CM | POA: Insufficient documentation

## 2021-08-23 DIAGNOSIS — R296 Repeated falls: Secondary | ICD-10-CM | POA: Diagnosis present
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
## 2021-08-24 ENCOUNTER — Ambulatory Visit: Payer: 59 | Admitting: Physical Therapy

## 2021-08-24 ENCOUNTER — Encounter: Payer: Self-pay | Admitting: Physical Therapy

## 2021-08-24 DIAGNOSIS — M6281 Muscle weakness (generalized): Secondary | ICD-10-CM

## 2021-08-24 DIAGNOSIS — R2681 Unsteadiness on feet: Secondary | ICD-10-CM

## 2021-08-24 DIAGNOSIS — R2689 Other abnormalities of gait and mobility: Secondary | ICD-10-CM

## 2021-08-24 DIAGNOSIS — R296 Repeated falls: Secondary | ICD-10-CM

## 2021-08-24 DIAGNOSIS — I69351 Hemiplegia and hemiparesis following cerebral infarction affecting right dominant side: Secondary | ICD-10-CM | POA: Diagnosis not present
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
## 2021-08-27 ENCOUNTER — Ambulatory Visit (INDEPENDENT_AMBULATORY_CARE_PROVIDER_SITE_OTHER): Payer: 59 | Admitting: Psychology

## 2021-08-27 DIAGNOSIS — F4323 Adjustment disorder with mixed anxiety and depressed mood: Secondary | ICD-10-CM

## 2021-08-27 NOTE — Progress Notes (Signed)
[facility] Counselor/Therapist Progress Note  Patient ID: Susan Lewis, MRN: [id number],    Date: 08/27/2021  Time Spent: 4:00pm - 4:50pm    50 minutes   Treatment Type: Individual Therapy  Reported Symptoms: stress, worry  Mental Status Exam: Appearance:  Casual     Behavior: Appropriate  Motor: Normal  Speech/Language:  Normal Rate  Affect: Appropriate  Mood: normal  Thought process: normal  Thought content:   WNL  Sensory/Perceptual disturbances:   WNL  Orientation: oriented to person, place, time/date, and situation  Attention: Good  Concentration: Good  Memory: WNL  Fund of knowledge:  Good  Insight:   Good  Judgment:  Good  Impulse Control: Good   Risk Assessment: Danger to Self:  No Self-injurious Behavior: No Danger to Others: No Duty to Warn:no Physical Aggression / Violence:No  Access to Firearms a concern: No  Gang Involvement:No   Subjective: Pt present for face-to-face individual therapy via video Webex.  Pt consents to telehealth video session due to COVID 19 pandemic. Location of pt: home  Location of therapist: home office.  Pt talked about her health.  She had caterac surgery and it went well.   She is in physical and speech therapy for her stroke rehab.  Pt is still having trouble with her hand.  Pt still has trouble with her speech at times. Pt gets tired each day and takes a nap which she states helps her.  Pt is also trying to get out of the house and take walks.  She is working on her crafts in her craft shed.  Pt's family has been supportive and helped her around the house.  Pt feels well cared for regarding her family and medical team.   Worked on self care strategies.  Provided supportive therapy.      Interventions: Cognitive Behavioral Therapy and Insight-Oriented  Diagnosis: F43.23  Plan: See pt's Treatment Plan for depression and anxiety in Therapy Charts.  (Treatment Plan Target Date: 11/06/2021) Pt is progressing toward  treatment goals.   Plan to continue to see pt every two weeks.    Donesha Wallander, LCSW

## 2021-08-28 ENCOUNTER — Ambulatory Visit: Payer: 59 | Admitting: Speech Pathology

## 2021-08-28 ENCOUNTER — Ambulatory Visit: Payer: 59 | Admitting: Physical Therapy

## 2021-08-30 ENCOUNTER — Ambulatory Visit: Payer: 59 | Admitting: Physical Therapy

## 2021-08-30 ENCOUNTER — Ambulatory Visit: Payer: 59 | Admitting: Speech Pathology

## 2021-08-30 ENCOUNTER — Encounter: Payer: Self-pay | Admitting: Physical Therapy

## 2021-08-30 ENCOUNTER — Encounter: Payer: Self-pay | Admitting: Speech Pathology

## 2021-08-30 DIAGNOSIS — I69351 Hemiplegia and hemiparesis following cerebral infarction affecting right dominant side: Secondary | ICD-10-CM | POA: Diagnosis not present

## 2021-08-30 DIAGNOSIS — I63512 Cerebral infarction due to unspecified occlusion or stenosis of left middle cerebral artery: Secondary | ICD-10-CM

## 2021-08-30 DIAGNOSIS — M6281 Muscle weakness (generalized): Secondary | ICD-10-CM

## 2021-08-30 DIAGNOSIS — R2681 Unsteadiness on feet: Secondary | ICD-10-CM

## 2021-08-30 DIAGNOSIS — F985 Adult onset fluency disorder: Secondary | ICD-10-CM

## 2021-08-30 DIAGNOSIS — R296 Repeated falls: Secondary | ICD-10-CM

## 2021-08-30 DIAGNOSIS — Z9181 History of falling: Secondary | ICD-10-CM

## 2021-08-30 DIAGNOSIS — R278 Other lack of coordination: Secondary | ICD-10-CM

## 2021-08-30 DIAGNOSIS — R4701 Aphasia: Secondary | ICD-10-CM

## 2021-08-30 DIAGNOSIS — R2689 Other abnormalities of gait and mobility: Secondary | ICD-10-CM
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
## 2021-08-31 ENCOUNTER — Encounter: Payer: Self-pay | Admitting: Physical Medicine & Rehabilitation

## 2021-08-31 ENCOUNTER — Encounter: Payer: 59 | Attending: Registered Nurse | Admitting: Physical Medicine & Rehabilitation

## 2021-08-31 VITALS — BP 137/53 | HR 50 | Ht 65.0 in | Wt 143.6 lb

## 2021-08-31 DIAGNOSIS — I63512 Cerebral infarction due to unspecified occlusion or stenosis of left middle cerebral artery: Secondary | ICD-10-CM | POA: Diagnosis present

## 2021-08-31 NOTE — Patient Instructions (Signed)
Aphasia Aphasia is a language disorder. It affects the part of the brain that is used to communicate. Aphasia does not affect intelligence, but a person may have trouble: Speaking. Understanding speech. Reading. Writing. Some people with aphasia may also have trouble with memory or attention.Aphasia can happen to anyone at any age. It is most common in older adults. What are the causes? This condition is caused by damage to the language centers of the brain. Damage may be caused by: Stroke. This causes reduced blood flow to certain areas of the brain. Stroke is the most common cause of aphasia. Traumatic brain injury (TBI). Brain tumor. Infection of the brain tissues. Nervous system disease that gradually gets worse, such as dementia or multiple sclerosis (MS). This is called progressive neurological disorder. Brain surgery. What are the signs or symptoms? Symptoms of this condition include: Trouble finding the right words or expressing thoughts and needs through speech. Using the wrong words, nonsense words, or jargon. Talking in sentences that do not make sense or that are not grammatically correct. Being unable to repeat back words and phrases. Difficulty expressing ideas through writing or not understanding what you are reading. Being unable to understand other people's speech. Having trouble understanding numbers. The condition affects people differently. Symptoms may start suddenly or comeon gradually, depending on the underlying cause. How is this diagnosed? This condition may be diagnosed based on a screening of your ability to communicate as soon as symptoms start, or when you are medically stable after a stroke or brain injury. Later, a more comprehensive assessment may be done in the hospital or at a rehabilitation center. The assessment may test your ability to: Use speech to communicate your needs. Use muscles in your mouth and throat for speaking and swallowing. Express  ideas with speech or other means of communication, such as hand gestures. Make conversation with others across a variety of topics. Hear and understand speech. Understand and produce written material. Manage memory and attention associated with communication. How is this treated? Treatment for this condition depends on your needs and abilities. The goal is to help restore your ability to communicate or find ways to manage communication challenges. Common treatments include: Speech-language therapy. Part of this may include: Rebuilding intonation, sentence structure, and vocabulary. Learning other ways to communicate, such as using word books, communication boards, or special software programs. Learning to communicate with writing, sign language, or hand gestures. Working with family members. This may include: Learning ways to communicate. Emotional support. Occupational therapy. This can help to find devices to assist with daily living. Treatment usually begins as soon as possible. It may begin while you are in the hospital and continue in a rehabilitation center or at home. In some cases, aphasia may improve quickly on its own. In other cases, recovery occurs moreslowly over time. Follow these instructions at home:  Make sure you have a good support system at home. Find a support group. This can help you connect with others who are going through the same thing. Try the following tips while communicating: Use short, simple sentences. Ask family members to do the same. Sentences that require one-word or short answers are easiest. Avoid distractions like background noise when trying to listen or talk. Try communicating with gestures, pointing, writing, or drawing. Talk slowly. Ask family members to talk to you slowly. Maintain eye contact when communicating. Ask family members to do the same when communicating with you. Ask family members to give you time to respond or to   engage in  conversation with them. Keep all follow-up visits. This is important. Where to find support National Aphasia Association: www.aphasia.org Contact a health care provider if: Your symptoms change or get worse. You are struggling with anxiety or depression. Get help right away if you have: Any symptoms of a stroke. "BE FAST" is an easy way to remember the main warning signs of a stroke: B - Balance. Signs are dizziness, sudden trouble walking, or loss of balance. E - Eyes. Signs are trouble seeing or a sudden change in vision. F - Face. Signs are sudden weakness or numbness of the face, or the face or eyelid drooping on one side. A - Arms. Signs are weakness or numbness in an arm. This happens suddenly and usually on one side of the body. S - Speech. Signs are sudden trouble speaking, slurred speech, or trouble understanding what people say. T - Time. Time to call emergency services. Write down what time symptoms started. Other signs of a stroke, such as: A sudden, severe headache with no known cause. Nausea or vomiting. Seizure. These symptoms may represent a serious problem that is an emergency. Do not wait to see if the symptoms will go away. Get medical help right away. Call your local emergency services (911 in the [place] Do not drive yourself to the hospital. Summary Aphasia is a language disorder. It may cause difficulty with speech, writing, reading, or understanding others. In some cases, aphasia may improve quickly on its own. In other cases, recovery occurs more slowly over time. Get help right away if you have symptoms of a stroke. This information is not intended to replace advice given to you by your health care provider. Make sure you discuss any questions you have with your healthcare provider. Document Revised: 01/13/2020 Document Reviewed: 01/13/2020 Elsevier Patient Education  2022 Elsevier Inc.  

## 2021-08-31 NOTE — Progress Notes (Signed)
Subjective:    Patient ID: Susan Lewis, female    DOB: 25-Oct-1960, 61 y.o.   MRN: [id number] Admit date: 06/15/2021 Discharge date: 06/22/2021  61 y.o. right-handed female with history of tobacco use PTSD hyperlipidemia and hypertension.  Per chart review lives with spouse independent prior to admission.  Presented 06/12/2021 with aphasia and right-sided weakness.  Reportedly she fell trying to get out of bed.  Family denied loss of consciousness.  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  No hemorrhage or mass effect.  CT angiogram head and neck positive for tandem emergent large vessel occlusions.  Left ICA in the neck and posterior M2 left MCA branch at the bifurcation.  CTP defects at 22 mL left MCA infarct core with 60 mL penumbra.  Admission chemistries unremarkable.  Patient underwent mechanical thrombectomy with revascularization per interventional radiology.  Follow-up MRI showed scattered diffusion restriction in the MCA territory.  No evidence of hemorrhagic transformation.  No significant mass effect.  Echocardiogram ejection fraction 70% no wall motion abnormalities grade 1 diastolic dysfunction.  Currently maintained on aspirin 81 mg daily and Brilinta 90 mg twice daily for 6 months then aspirin alone.  She was cleared to begin Lovenox for DVT prophylaxis.  Tolerating a regular diet.  Therapy evaluations completed due to patient's right side weakness and aphasia was admitted for a comprehensive rehab program. 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 Patient also would like to get back to driving. She is able to dress and bathe herself.  She is able to ambulate without an assistive device Patient states she has some pain in the upper extremity does not  interfere with sleep.  She takes medications prescribed by her primary care for these chronic pains in her shoulders She still attends outpatient speech and PT.   Pain Inventory Average Pain 8 Pain Right Now 7 My pain is constant, sharp, burning, stabbing, tingling, and aching  In the last 24 hours, has pain interfered with the following? General activity 8 Relation with others 7 Enjoyment of life 8 What TIME of day is your pain at its worst? morning , daytime, and evening Sleep (in general) Good  Pain is worse with: walking, bending, inactivity, and standing Pain improves with: rest, heat/ice, therapy/exercise, pacing activities, medication, and injections Relief from Meds: 5  No family history on file. Social History   Socioeconomic History   Marital status: Married    Spouse name: Not on file   Number of children: 2   Years of education: Not on file   Highest education level: Not on file  Occupational History   Occupation: Laundry mat attendant  Tobacco Use   Smoking status: Former    Packs/day: 1.00    Years: 45.00    Total pack years: 45.00    Types: Cigarettes    Start date: 08/28/1971    Quit date: 06/12/2021    Years since quitting: 0.2   Smokeless tobacco: Never  Vaping Use   Vaping Use: Never used  Substance and Sexual Activity   Alcohol use: No   Drug use: Not Currently    Types: Marijuana    Comment: Frequent, daily use. none since stroke 06/12/21   Sexual activity: Yes  Other Topics Concern  Not on file  Social History Narrative   Right handed   Hot tea   One story home   Social Determinants of Health   Financial Resource Strain: Not on file  Food Insecurity: Not on file  Transportation Needs: Not on file  Physical Activity: Not on file  Stress: Not on file  Social Connections: Not on file   Past Surgical History:  Procedure Laterality Date   CARPAL TUNNEL RELEASE Right    carpel tunnel    CATARACT EXTRACTION W/PHACO Left 08/08/2021    Procedure: CATARACT EXTRACTION PHACO AND INTRAOCULAR LENS PLACEMENT ([place]) LEFT;  Surgeon: Eulogio Bear, MD;  Location: [place];  Service: Ophthalmology;  Laterality: Left;  2.35 0:17.4   CATARACT EXTRACTION W/PHACO Right 08/20/2021   Procedure: CATARACT EXTRACTION PHACO AND INTRAOCULAR LENS PLACEMENT ([place]) RIGHT;  Surgeon: Eulogio Bear, MD;  Location: [place];  Service: Ophthalmology;  Laterality: Right;  2.63 0:25.5   CESAREAN SECTION     CHOLECYSTECTOMY     ENDOMETRIAL ABLATION     EXCISIONAL HEMORRHOIDECTOMY     IR ANGIO EXTRACRAN SEL COM CAROTID INNOMINATE UNI R MOD SED  06/12/2021   IR CT HEAD LTD  06/12/2021   IR CT HEAD LTD  06/12/2021   IR INTRAVSC STENT CERV CAROTID W/O EMB-PROT MOD SED INC ANGIO  06/12/2021   IR PERCUTANEOUS ART THROMBECTOMY/INFUSION INTRACRANIAL INC DIAG ANGIO  06/12/2021   OOPHORECTOMY Left    ORIF ANKLE FRACTURE BIMALLEOLAR     RADIOLOGY WITH ANESTHESIA N/A 06/12/2021   Procedure: IR WITH ANESTHESIA;  Surgeon: Radiologist, Medication, MD;  Location: [place];  Service: Radiology;  Laterality: N/A;   Past Surgical History:  Procedure Laterality Date   CARPAL TUNNEL RELEASE Right    carpel tunnel    CATARACT EXTRACTION W/PHACO Left 08/08/2021   Procedure: CATARACT EXTRACTION PHACO AND INTRAOCULAR LENS PLACEMENT ([place]) LEFT;  Surgeon: Eulogio Bear, MD;  Location: [place];  Service: Ophthalmology;  Laterality: Left;  2.35 0:17.4   CATARACT EXTRACTION W/PHACO Right 08/20/2021   Procedure: CATARACT EXTRACTION PHACO AND INTRAOCULAR LENS PLACEMENT ([place]) RIGHT;  Surgeon: Eulogio Bear, MD;  Location: [place] [facility];  Service: Ophthalmology;  Laterality: Right;  2.63 0:25.5   CESAREAN SECTION     CHOLECYSTECTOMY     ENDOMETRIAL ABLATION     EXCISIONAL HEMORRHOIDECTOMY     IR ANGIO EXTRACRAN SEL COM CAROTID INNOMINATE UNI R MOD SED  06/12/2021   IR CT HEAD LTD  06/12/2021   IR CT HEAD LTD  06/12/2021   IR INTRAVSC  STENT CERV CAROTID W/O EMB-PROT MOD SED INC ANGIO  06/12/2021   IR PERCUTANEOUS ART THROMBECTOMY/INFUSION INTRACRANIAL INC DIAG ANGIO  06/12/2021   OOPHORECTOMY Left    ORIF ANKLE FRACTURE BIMALLEOLAR     RADIOLOGY WITH ANESTHESIA N/A 06/12/2021   Procedure: IR WITH ANESTHESIA;  Surgeon: Radiologist, Medication, MD;  Location: [place];  Service: Radiology;  Laterality: N/A;   Past Medical History:  Diagnosis Date   Anxiety    Arthritis    Bursitis    Carpal tunnel syndrome    COPD (chronic obstructive pulmonary disease) (HCC)    Depression    Expressive aphasia    Difficulty "finding words" since stroke 06/12/21   Fibromyalgia    GERD (gastroesophageal reflux disease)    Heart murmur    "all my life".  followed by PCP   Hemorrhoids 10/27/2014   Hyperlipidemia    Hypertension    Kidney stones  Right sided weakness    S/P stroke 06/12/21   Stroke (HCC) 06/12/2021   BP (!) 137/53   Pulse (!) 50   Ht 5\' 5"  (1.651 m)   Wt 143 lb 9.6 oz (65.1 kg)   SpO2 98%   BMI 23.90 kg/m   Opioid Risk Score:   Fall Risk Score:  `1  Depression screen [facility] 2/9     08/31/2021   10:25 AM 07/09/2021    2:45 PM 06/28/2020    3:57 PM 06/28/2020    2:56 PM  Depression screen PHQ 2/9  Decreased Interest 0 3 2 2   Down, Depressed, Hopeless 0 2 2 2   PHQ - 2 Score 0 5 4 4   Altered sleeping  2 0   Tired, decreased energy  2 3   Change in appetite  3 0   Feeling bad or failure about yourself   0 3   Trouble concentrating  2 3   Moving slowly or fidgety/restless  3 2   Suicidal thoughts  0 0   PHQ-9 Score  17 15   Difficult doing work/chores  Somewhat difficult Somewhat difficult       Review of Systems  Musculoskeletal:  Positive for back pain and neck pain.       Bilateral shoulder pain Right buttocks pain Right arm pain Left hip pain  All other systems reviewed and are negative.     Objective:   Physical Exam Vitals and nursing note reviewed.  Constitutional:      Appearance: She is  obese.  HENT:     Head: Normocephalic and atraumatic.  Eyes:     Extraocular Movements: Extraocular movements intact.     Conjunctiva/sclera: Conjunctivae normal.     Pupils: Pupils are equal, round, and reactive to light.  Neurological:     General: No focal deficit present.     Mental Status: She is alert and oriented to person, place, and time.     Comments: Patient aphasic mainly expressive she has good comprehension.  She has reduced fine motor right finger to thumb opposition mild dysdiadochokinesis on the right side. Motor strength is 5/5 bilateral hip flexor knee extensor ankle dorsiflexion as well as bilateral deltoid bicep tricep grip Ambulates without assistive device no evidence of toe drag or knee instability.  The patient able to toe walk she has difficulty with heel walking as well as tandem gait Can speak at his phrase level she is anomia mainly for less frequently used words.  No apparent comprehension issues Tone is normal in the right upper and right lower limb.   Psychiatric:        Mood and Affect: Mood normal.        Thought Content: Thought content normal.        Judgment: Judgment normal.           Assessment & Plan:  1.  Left MCA, MCA M2 branch heart status post revascularization.  As discussed with patient she is having a very good recovery.  Main issues include balance as well as expressive language.  We discussed timeframe of recovery and that she will likely make some further improvement.  I do anticipate that she can go back to driving in the future we will see her back in 3 months to reassess. We also discussed the timeline of recovering regarding aphasia and the possibility of going back to work with some further improvements in communication.

## 2021-09-03 ENCOUNTER — Telehealth: Payer: Self-pay | Admitting: Family Medicine

## 2021-09-03 ENCOUNTER — Ambulatory Visit: Payer: 59 | Admitting: Family Medicine

## 2021-09-03 NOTE — Telephone Encounter (Signed)
Pt was a no show on 09/03/21 with Dr. Veto Kemps for an OV, I sent a no show letter.

## 2021-09-04 ENCOUNTER — Encounter: Payer: Self-pay | Admitting: Speech Pathology

## 2021-09-04 ENCOUNTER — Ambulatory Visit: Payer: 59 | Admitting: Occupational Therapy

## 2021-09-04 ENCOUNTER — Ambulatory Visit: Payer: 59 | Admitting: Physical Therapy

## 2021-09-04 ENCOUNTER — Ambulatory Visit: Payer: 59 | Admitting: Speech Pathology

## 2021-09-04 ENCOUNTER — Encounter: Payer: Self-pay | Admitting: Physical Therapy

## 2021-09-04 DIAGNOSIS — Z9181 History of falling: Secondary | ICD-10-CM

## 2021-09-04 DIAGNOSIS — R2689 Other abnormalities of gait and mobility: Secondary | ICD-10-CM

## 2021-09-04 DIAGNOSIS — R296 Repeated falls: Secondary | ICD-10-CM

## 2021-09-04 DIAGNOSIS — R2681 Unsteadiness on feet: Secondary | ICD-10-CM

## 2021-09-04 DIAGNOSIS — R278 Other lack of coordination: Secondary | ICD-10-CM

## 2021-09-04 DIAGNOSIS — R4701 Aphasia: Secondary | ICD-10-CM

## 2021-09-04 DIAGNOSIS — I69351 Hemiplegia and hemiparesis following cerebral infarction affecting right dominant side: Secondary | ICD-10-CM | POA: Diagnosis not present

## 2021-09-04 DIAGNOSIS — M6281 Muscle weakness (generalized): Secondary | ICD-10-CM

## 2021-09-04 DIAGNOSIS — R208 Other disturbances of skin sensation: Secondary | ICD-10-CM
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
## 2021-09-06 ENCOUNTER — Ambulatory Visit: Payer: 59 | Admitting: Speech Pathology

## 2021-09-06 ENCOUNTER — Ambulatory Visit: Payer: 59 | Admitting: Physical Therapy

## 2021-09-06 ENCOUNTER — Ambulatory Visit: Payer: 59 | Admitting: Occupational Therapy

## 2021-09-07 ENCOUNTER — Other Ambulatory Visit: Payer: Self-pay

## 2021-09-07 NOTE — Patient Outreach (Signed)
[facility]) Care Management  09/07/2021  Susan Lewis 23-Mar-1960 [id number]   First telephone outreach attempt to obtain mRS. No answer. Left message for returned call.  [name] [facility] Management Assistant [phone]

## 2021-09-10 ENCOUNTER — Ambulatory Visit: Payer: 59 | Admitting: Psychology

## 2021-09-11 ENCOUNTER — Ambulatory Visit: Payer: 59 | Admitting: Occupational Therapy

## 2021-09-11 ENCOUNTER — Ambulatory Visit: Payer: 59 | Admitting: Physical Therapy

## 2021-09-11 ENCOUNTER — Ambulatory Visit: Payer: 59 | Admitting: Speech Pathology

## 2021-09-13 ENCOUNTER — Ambulatory Visit: Payer: 59 | Admitting: Speech Pathology

## 2021-09-13 ENCOUNTER — Ambulatory Visit: Payer: 59 | Admitting: Physical Therapy

## 2021-09-13 ENCOUNTER — Ambulatory Visit: Payer: 59 | Admitting: Occupational Therapy

## 2021-09-19 ENCOUNTER — Ambulatory Visit: Payer: 59 | Admitting: Occupational Therapy

## 2021-09-19 ENCOUNTER — Other Ambulatory Visit: Payer: Self-pay | Admitting: Family Medicine

## 2021-09-19 ENCOUNTER — Ambulatory Visit: Payer: 59 | Admitting: Speech Pathology

## 2021-09-19 ENCOUNTER — Ambulatory Visit: Payer: 59 | Admitting: Physical Therapy

## 2021-09-19 DIAGNOSIS — F419 Anxiety disorder, unspecified: Secondary | ICD-10-CM

## 2021-09-20 ENCOUNTER — Ambulatory Visit: Payer: 59

## 2021-09-20 ENCOUNTER — Ambulatory Visit: Payer: 59 | Admitting: Occupational Therapy

## 2021-09-20 ENCOUNTER — Ambulatory Visit: Payer: 59 | Admitting: Speech Pathology

## 2021-09-25 ENCOUNTER — Encounter: Payer: Self-pay | Admitting: Family Medicine

## 2021-09-25 ENCOUNTER — Ambulatory Visit (INDEPENDENT_AMBULATORY_CARE_PROVIDER_SITE_OTHER): Payer: 59 | Admitting: Family Medicine

## 2021-09-25 VITALS — BP 118/70 | HR 68 | Temp 97.9°F | Ht 65.0 in | Wt 137.2 lb

## 2021-09-25 DIAGNOSIS — R4701 Aphasia: Secondary | ICD-10-CM | POA: Diagnosis not present

## 2021-09-25 DIAGNOSIS — I10 Essential (primary) hypertension: Secondary | ICD-10-CM

## 2021-09-25 DIAGNOSIS — G8191 Hemiplegia, unspecified affecting right dominant side: Secondary | ICD-10-CM | POA: Insufficient documentation

## 2021-09-25 DIAGNOSIS — H6123 Impacted cerumen, bilateral: Secondary | ICD-10-CM

## 2021-09-25 DIAGNOSIS — Z8673 Personal history of transient ischemic attack (TIA), and cerebral infarction without residual deficits: Secondary | ICD-10-CM
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
## 2021-09-26 ENCOUNTER — Other Ambulatory Visit: Payer: Self-pay

## 2021-09-26 NOTE — Patient Outreach (Signed)
[facility]) Care Management  09/26/2021  Susan Lewis Dec 04, 1960 [id number]   Second telephone outreach attempt to obtain mRS. No answer. Left message for returned call.  [name] [facility] Management Assistant [phone]

## 2021-09-27 ENCOUNTER — Encounter: Payer: Self-pay | Admitting: Family Medicine

## 2021-09-27 ENCOUNTER — Other Ambulatory Visit: Payer: Self-pay

## 2021-09-27 ENCOUNTER — Ambulatory Visit (INDEPENDENT_AMBULATORY_CARE_PROVIDER_SITE_OTHER): Payer: 59 | Admitting: Family Medicine

## 2021-09-27 VITALS — BP 122/74 | HR 65 | Temp 97.9°F | Ht 65.0 in | Wt 138.0 lb

## 2021-09-27 DIAGNOSIS — H60321 Hemorrhagic otitis externa, right ear: Secondary | ICD-10-CM | POA: Diagnosis not present

## 2021-09-27 DIAGNOSIS — R040 Epistaxis: Secondary | ICD-10-CM

## 2021-09-27 MED ORDER — NEOMYCIN-POLYMYXIN-HC 3.5-10000-1 OT SUSP: 3.0000 [drp] | Freq: Three times a day (TID) | OTIC | 0 refills | Status: DC

## 2021-09-27 MED ORDER — MUPIROCIN 2 % EX OINT: TOPICAL_OINTMENT | CUTANEOUS | 0 refills | Status: AC

## 2021-09-27 NOTE — Progress Notes (Signed)
Established Patient Office Visit  Subjective   Patient ID: Susan Lewis, female    DOB: 04-Oct-1960  Age: 61 y.o. MRN: [id number]  Chief Complaint  Patient presents with   Ear Pain    Right ear bleeding after irrigation few days ago pain in both ears. Concerns about sores in nose that continues to bleed.     HPI presents for evaluation of bleeding from right ear and irritation of right nares.  Status post irrigation of cerumen 2 days ago.  Ongoing intermittent irritation of right nares.    Review of Systems  Constitutional:  Negative for chills, diaphoresis, malaise/fatigue and weight loss.  HENT:  Positive for ear discharge, ear pain and nosebleeds. Negative for hearing loss.   Eyes: Negative.  Negative for blurred vision and double vision.  Cardiovascular:  Negative for chest pain.  Gastrointestinal:  Negative for abdominal pain.  Genitourinary: Negative.   Musculoskeletal:  Negative for falls and myalgias.  Neurological:  Negative for speech change, loss of consciousness and weakness.  Psychiatric/Behavioral: Negative.        Objective:     BP 122/74 (BP Location: Right Arm, Patient Position: Sitting, Cuff Size: Normal)   Pulse 65   Temp 97.9 F (36.6 C) (Temporal)   Ht 5\' 5"  (1.651 m)   Wt 138 lb (62.6 kg)   SpO2 97%   BMI 22.96 kg/m    Physical Exam Constitutional:      General: She is not in acute distress.    Appearance: Normal appearance. She is not ill-appearing, toxic-appearing or diaphoretic.  HENT:     Head: Normocephalic and atraumatic.     Right Ear: External ear normal.     Left Ear: Tympanic membrane, ear canal and external ear normal.     Ears:      Nose:      Mouth/Throat:     Mouth: Mucous membranes are moist.     Pharynx: Oropharynx is clear. No oropharyngeal exudate or posterior oropharyngeal erythema.  Eyes:     General: No scleral icterus.       Right eye: No discharge.        Left eye: No discharge.     Extraocular Movements:  Extraocular movements intact.     Conjunctiva/sclera: Conjunctivae normal.  Pulmonary:     Effort: Pulmonary effort is normal. No respiratory distress.  Musculoskeletal:     Cervical back: No rigidity or tenderness.  Skin:    General: Skin is warm and dry.       Neurological:     Mental Status: She is alert and oriented to person, place, and time.  Psychiatric:        Mood and Affect: Mood normal.        Behavior: Behavior normal.      No results found for any visits on 09/27/21.    The ASCVD Risk score (Arnett DK, et al., 2019) failed to calculate for the following reasons:   The patient has a prior MI or stroke diagnosis    Assessment & Plan:   Problem List Items Addressed This Visit       Nervous and Auditory   Acute hemorrhagic otitis externa of right ear - Primary   Relevant Medications   neomycin-polymyxin-hydrocortisone (CORTISPORIN) 3.5-10000-1 OTIC suspension     Other   Epistaxis   Relevant Medications   mupirocin ointment (BACTROBAN) 2 %    Return Schedule follow-up appointment with Dr. 11-11-2002 next few weeks.Veto Kemps  Ethelene Hal, MD

## 2021-09-28 ENCOUNTER — Other Ambulatory Visit: Payer: Self-pay

## 2021-09-28 NOTE — Patient Outreach (Signed)
[facility]) Care Management  09/28/2021  Susan Lewis Aug 04, 1960 [id number]   3 outreach attempts were completed to obtain mRs. mRs could not be obtained because patient never returned my calls. mRs=7    [name] Care Management Assistant [phone]

## 2021-10-12 ENCOUNTER — Telehealth: Payer: Self-pay | Admitting: Family Medicine

## 2021-10-12 DIAGNOSIS — H9190 Unspecified hearing loss, unspecified ear: Secondary | ICD-10-CM

## 2021-10-12 DIAGNOSIS — H60321 Hemorrhagic otitis externa, right ear: Secondary | ICD-10-CM

## 2021-10-12 NOTE — Telephone Encounter (Signed)
Lft detailed VM that referral was placed.  Dm/cma

## 2021-10-12 NOTE — Telephone Encounter (Signed)
Please review and advise. Thanks. Dm/cma  

## 2021-10-12 NOTE — Telephone Encounter (Signed)
Pt is wanting a referral to an ENT provider for her problem hearing, she said she has spoken about this with you before. Please advise pt at [phone]

## 2021-10-15 ENCOUNTER — Ambulatory Visit (INDEPENDENT_AMBULATORY_CARE_PROVIDER_SITE_OTHER): Payer: 59 | Admitting: Psychology

## 2021-10-15 DIAGNOSIS — F4323 Adjustment disorder with mixed anxiety and depressed mood: Secondary | ICD-10-CM | POA: Diagnosis not present

## 2021-10-15 NOTE — Progress Notes (Signed)
[facility] Counselor/Therapist Progress Note  Patient ID: Susan Lewis, MRN: [id number],    Date: 10/15/2021  Time Spent: 11:00am - 11:50am    50 minutes   Treatment Type: Individual Therapy  Reported Symptoms: stress, worry  Mental Status Exam: Appearance:  Casual     Behavior: Appropriate  Motor: Normal  Speech/Language:  Normal Rate  Affect: Appropriate  Mood: normal  Thought process: normal  Thought content:   WNL  Sensory/Perceptual disturbances:   WNL  Orientation: oriented to person, place, time/date, and situation  Attention: Good  Concentration: Good  Memory: WNL  Fund of knowledge:  Good  Insight:   Good  Judgment:  Good  Impulse Control: Good   Risk Assessment: Danger to Self:  No Self-injurious Behavior: No Danger to Others: No Duty to Warn:no Physical Aggression / Violence:No  Access to Firearms a concern: No  Gang Involvement:No   Subjective: Pt present for face-to-face individual therapy via video Webex.  Pt consents to telehealth video session due to COVID 19 pandemic. Location of pt: home  Location of therapist: home office.  Pt talked about her health.  She is in physical and speech therapy for her stroke rehab.  Pt feels like she is making progress.  Pt is also trying to get out of the house and take walks.  She is working on her crafts in her craft shed.  Pt is going back to work part time this week to see how she does and if she can handle it bc of her ephasia.  She is looking forward to go back and see people.    Pt states she is a regular customer at the food banks which has helped her family with food while pt has not been working.  Pt's husband is at a job interview today at [employer].   Pt's family has been supportive and helped her around the house. Worked on self care strategies.  Provided supportive therapy.      Interventions: Cognitive Behavioral Therapy and Insight-Oriented  Diagnosis: F43.23  Plan: See pt's  Treatment Plan for depression and anxiety in Therapy Charts.  (Treatment Plan Target Date: 11/06/2021) Pt is progressing toward treatment goals.   Plan to continue to see pt every two weeks.    [name], LCSW

## 2021-10-18 NOTE — Telephone Encounter (Signed)
1st no show, fee waived, letter sent 

## 2021-10-29 ENCOUNTER — Ambulatory Visit (INDEPENDENT_AMBULATORY_CARE_PROVIDER_SITE_OTHER): Payer: 59 | Admitting: Psychology

## 2021-10-29 DIAGNOSIS — F4323 Adjustment disorder with mixed anxiety and depressed mood: Secondary | ICD-10-CM | POA: Diagnosis not present

## 2021-10-29 NOTE — Progress Notes (Signed)
[place] Counselor/Therapist Progress Note  Patient ID: Susan Lewis, MRN: [id number],    Date: 10/29/2021  Time Spent: 11:00am - 11:45am    45 minutes   Treatment Type: Individual Therapy  Reported Symptoms: stress, worry  Mental Status Exam: Appearance:  Casual     Behavior: Appropriate  Motor: Normal  Speech/Language:  Normal Rate  Affect: Appropriate  Mood: normal  Thought process: normal  Thought content:   WNL  Sensory/Perceptual disturbances:   WNL  Orientation: oriented to person, place, time/date, and situation  Attention: Good  Concentration: Good  Memory: WNL  Fund of knowledge:  Good  Insight:   Good  Judgment:  Good  Impulse Control: Good   Risk Assessment: Danger to Self:  No Self-injurious Behavior: No Danger to Others: No Duty to Warn:no Physical Aggression / Violence:No  Access to Firearms a concern: No  Gang Involvement:No   Subjective: Pt present for face-to-face individual therapy via video Webex.  Pt consents to telehealth video session due to COVID 19 pandemic. Location of pt: home  Location of therapist: home office.  Pt talked about her health.   She has been feeling sick today, but otherwise has been doing ok.   She is still working on rehab from her stroke.  Pt talked about work.  She is working 4 hours a week to get use to working again.  She states it has been going well and is happy to see people.  She feels like 4 hours is about all she can handle right not. Pt is disappointed that her husband's job at [employer] has not worked out yet.  He was hired but they have not followed through with scheduling him.  Pt is accessing food banks to get their food needs met.   Pt's family has been supportive. Worked on self care strategies.  Provided supportive therapy.      Interventions: Cognitive Behavioral Therapy and Insight-Oriented  Diagnosis: F43.23  Plan: See pt's Treatment Plan for depression and anxiety in Therapy  Charts.  (Treatment Plan Target Date: 11/06/2021) Pt is progressing toward treatment goals.   Plan to continue to see pt every two weeks.    Susan Lubinski, LCSW

## 2021-11-05 ENCOUNTER — Encounter: Payer: Self-pay | Admitting: Family Medicine

## 2021-11-12 ENCOUNTER — Ambulatory Visit (INDEPENDENT_AMBULATORY_CARE_PROVIDER_SITE_OTHER): Payer: 59 | Admitting: Psychology

## 2021-11-12 ENCOUNTER — Other Ambulatory Visit: Payer: Self-pay

## 2021-11-12 DIAGNOSIS — F4323 Adjustment disorder with mixed anxiety and depressed mood: Secondary | ICD-10-CM

## 2021-11-12 DIAGNOSIS — I63412 Cerebral infarction due to embolism of left middle cerebral artery: Secondary | ICD-10-CM

## 2021-11-12 MED ORDER — TICAGRELOR 90 MG PO TABS: 90.0000 mg | ORAL_TABLET | Freq: Two times a day (BID) | ORAL | 1 refills | Status: DC
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
## 2021-12-03 ENCOUNTER — Ambulatory Visit: Payer: Self-pay | Admitting: Psychology

## 2021-12-04 ENCOUNTER — Encounter: Payer: 59 | Attending: Registered Nurse | Admitting: Physical Medicine & Rehabilitation

## 2021-12-04 DIAGNOSIS — I63512 Cerebral infarction due to unspecified occlusion or stenosis of left middle cerebral artery: Secondary | ICD-10-CM | POA: Insufficient documentation

## 2021-12-17 ENCOUNTER — Ambulatory Visit: Payer: Self-pay | Admitting: Psychology

## 2021-12-18 ENCOUNTER — Telehealth: Payer: Self-pay | Admitting: Family Medicine

## 2021-12-18 ENCOUNTER — Other Ambulatory Visit: Payer: Self-pay | Admitting: Family Medicine

## 2021-12-18 DIAGNOSIS — M5137 Other intervertebral disc degeneration, lumbosacral region: Secondary | ICD-10-CM

## 2021-12-18 NOTE — Telephone Encounter (Signed)
Caller Name: Monicka Cyran Call back phone #: [phone]  Reason for Call: Changed insurance and they will not cover Brilinta. Please call pt to speak about possible alternatives

## 2021-12-20 ENCOUNTER — Telehealth: Payer: Self-pay | Admitting: Neurology

## 2021-12-20 NOTE — Telephone Encounter (Signed)
Spoke to patient, and advised to Dr Tomi Likens and gave her their phone number to call.  Dm/cma

## 2021-12-20 NOTE — Telephone Encounter (Signed)
Patient is out of Brilinta due to insurance not covering it.  She has na appointment on 12/26/21 .  Is it ok for her not to take it or can you send in something else? Please review and advise.  Thanks. Dm/cma

## 2021-12-20 NOTE — Telephone Encounter (Signed)
Patient called and left a message that her new insurance is not covering her medication and she may need to change it.  Returned call and patient now has [place].  She said the medication is a blood thinner but she is not sure who fills the Rx.   Patient checked the Rx bottle and it was prescribed by Dr. Gena Fray.  Patient says Dr. Gena Fray is wanting Dr. Georgie Chard opinion about the medication because she cannot continue taking it due to cost.

## 2021-12-21 NOTE — Telephone Encounter (Signed)
Called patient and informed her of Dr. Tomi Likens answer. She

## 2021-12-21 NOTE — Telephone Encounter (Signed)
He said in his notes to stop it in 6 months.

## 2021-12-26 ENCOUNTER — Ambulatory Visit (INDEPENDENT_AMBULATORY_CARE_PROVIDER_SITE_OTHER): Payer: Commercial Managed Care - HMO | Admitting: Family Medicine

## 2021-12-26 ENCOUNTER — Encounter: Payer: Self-pay | Admitting: Family Medicine

## 2021-12-26 VITALS — BP 118/64 | HR 81 | Temp 97.5°F | Ht 65.0 in | Wt 143.2 lb

## 2021-12-26 DIAGNOSIS — I10 Essential (primary) hypertension: Secondary | ICD-10-CM

## 2021-12-26 DIAGNOSIS — Z8673 Personal history of transient ischemic attack (TIA), and cerebral infarction without residual deficits: Secondary | ICD-10-CM | POA: Diagnosis not present

## 2021-12-26 DIAGNOSIS — Z23 Encounter for immunization: Secondary | ICD-10-CM

## 2021-12-26 DIAGNOSIS — R4701 Aphasia: Secondary | ICD-10-CM

## 2021-12-26 DIAGNOSIS — E785 Hyperlipidemia, unspecified: Secondary | ICD-10-CM | POA: Diagnosis not present

## 2021-12-26 DIAGNOSIS — M19071 Primary osteoarthritis, right ankle and foot: Secondary | ICD-10-CM
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
## 2022-01-08 NOTE — Progress Notes (Deleted)
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     Secondary stroke prevention: ASA 81mg  daily and Brilinta 90mg  twice daily for 6 months, followed by ASA 81mg  daily monotherapy Atorvastatin 20mg  daily.  LDL goal less than 70 Normotensive blood pressure Hgb A1c goal less than 7 Continue speech/PT/OT Smoking cessation - no cigarettes since hospitalization. Mediterranean diet Follow up in 6 months.           Subjective:  Jacquelyn Shadrick is a 61 year old right-handed female with COPD, CAD, HTN, HLD, bilateral cataract, fibromyalgia/chronic degenerative neck and back pain, depression, PTSD, tobacco use disorder and history of kidney stones who follows up for stroke.  UPDATE: Current medications:  ASA 81mg  daily, atorvastatin 20mg  daily ***     HISTORY: On 06/12/2021, patient developed sudden onset aphasia and right extremity weakness.  She presented to [facility] ED where CT head showed left frontal insular infarct CT perfusion showed significant penumbra and salvageable brain tissue.  CTA head and neck showed left carotid and left M2 branch occlusion.  NIHSS was 13.  She was outside window of IV tPA but within window for mechanical thrombectomy with TICI3 revascularization and stent-assisted angioplasty with Dr. Estanislado Pandy.  TTE showed hyperdynamic LVEF 70% with mildly dilated left atrial size but no regional wall motion abnormalities or interatrial shunt.  LDL was 51 and Hgb A1c was 5.1.  She was not on antithrombotic therapy prior to admission.  She was discharged  on ASA 81mg  daily and Brilinta 90mg  twice daily for 6 months followed by ASA 81mg  daily alone.  She was continued on her current atorvastatin 20mg  daily.  She went to inpatient rehab and was discharged on 06/22/2021.  She is supposed to get speech therapy and PT.     Prior to the stroke, she was having visual disturbance.  She has bilateral cataract but noted significantly worsened vision in the left eye.   PAST MEDICAL HISTORY: Past Medical History:  Diagnosis Date   Anxiety    Arthritis    Bursitis    Carpal tunnel syndrome    COPD (chronic obstructive pulmonary disease) ([place])    Depression    Expressive aphasia    Difficulty "finding words" since stroke 06/12/21   Fibromyalgia    GERD (gastroesophageal reflux disease)    Heart murmur    "all my life".  followed by PCP   Hemorrhoids 10/27/2014   Hyperlipidemia    Hypertension    Kidney stones    Right sided weakness    S/P stroke 06/12/21   Stroke ([place]) 06/12/2021    MEDICATIONS: Current Outpatient Medications on File Prior to Visit  Medication Sig Dispense Refill   acetaminophen (TYLENOL) 325 MG tablet Take 2 tablets (650 mg total) by mouth every 4 (four) hours as needed for mild pain (or temp > 37.5 C (99.5 F)).     albuterol (VENTOLIN HFA) 108 (90 Base) MCG/ACT inhaler INHALE 2 PUFFS INTO THE LUNGS EVERY 6 HOURS AS NEEDED FOR UP TO 30 DAYS FOR WHEEZING 8 g 0   amLODipine (NORVASC) 5 MG tablet Take 1 tablet (5 mg total) by mouth  daily. 90 tablet 3   aspirin 81 MG chewable tablet Chew 1 tablet (81 mg total) by mouth daily. 30 tablet 0   atorvastatin (LIPITOR) 20 MG tablet Take 1 tablet (20 mg total) by mouth at bedtime. 90 tablet 3   azelastine (ASTELIN) 0.1 % nasal spray Place 1 spray into both nostrils 2 (two) times daily. Use in each nostril as directed     busPIRone (BUSPAR) 15 MG tablet TAKE ONE TABLET BY MOUTH TWICE A DAY 60 tablet 5   calcium carbonate (TUMS - DOSED IN MG ELEMENTAL CALCIUM) 500 MG chewable tablet Chew 1  tablet (200 mg of elemental calcium total) by mouth 3 (three) times daily.     fluticasone (FLONASE) 50 MCG/ACT nasal spray Place 1 spray into both nostrils daily. 9.9 mL 5   gabapentin (NEURONTIN) 300 MG capsule Take 1 capsule (300 mg total) by mouth 2 (two) times daily. 180 capsule 3   hydrOXYzine (ATARAX) 25 MG tablet Take 1 tablet (25 mg total) by mouth 3 (three) times daily as needed for anxiety. 10 tablet 0   meloxicam (MOBIC) 15 MG tablet Take 1 tablet by mouth daily.     mupirocin ointment (BACTROBAN) 2 % Apply a small dab just inside the right nare twice daily. 15 g 0   neomycin-polymyxin-hydrocortisone (CORTISPORIN) 3.5-10000-1 OTIC suspension Place 3 drops into the right ear 3 (three) times daily. 10 mL 0   omeprazole (PRILOSEC) 40 MG capsule Take 1 capsule (40 mg total) by mouth daily. 90 capsule 3   oxybutynin (DITROPAN) 5 MG tablet Take 1 tablet (5 mg total) by mouth 3 (three) times daily. 90 tablet 2   sertraline (ZOLOFT) 100 MG tablet Take 1.5 tablets (150 mg total) by mouth daily. 135 tablet 3   solifenacin (VESICARE) 5 MG tablet Take 1 tablet (5 mg total) by mouth daily. 30 tablet 0   traMADol (ULTRAM) 50 MG tablet TAKE 1 TABLET (50 MG TOTAL) BY MOUTH EVERY 12 (TWELVE) HOURS AS NEEDED 30 tablet 0   traZODone (DESYREL) 150 MG tablet Take 1 tablet (150 mg total) by mouth at bedtime. 90 tablet 3   No current facility-administered medications on file prior to visit.    ALLERGIES: Allergies  Allergen Reactions   Morphine Other (See Comments)    Hallucination   Codeine Itching    FAMILY HISTORY: No family history on file.    Objective:  *** General: No acute distress.  Patient appears well-groomed.   Head:  Normocephalic/atraumatic Eyes:  Fundi examined but not visualized Neck: supple, no paraspinal tenderness, full range of motion Heart:  Regular rate and rhythm Neurological Exam: alert and oriented to person, place, and time.  Speech fluent and not dysarthric,  language intact.  Monocular vision loss in left eye.  Otherwise, CN II-XII intact. Bulk and tone normal, muscle strength 5-/5 left deltoid, reduced finger-thumb tapping on right, otherwise 5/5 throughout.  Sensation to light touch intact.  Deep tendon reflexes 2+ throughout, toes downgoing.  Finger to nose testing intact.  Gait normal, Romberg negative.   Shon Millet, DO  CC: Herbie Drape, MD

## 2022-01-09 ENCOUNTER — Other Ambulatory Visit: Payer: Commercial Managed Care - HMO

## 2022-01-11 ENCOUNTER — Ambulatory Visit: Payer: Commercial Managed Care - HMO | Admitting: Neurology

## 2022-01-11 DIAGNOSIS — Z0279 Encounter for issue of other medical certificate: Secondary | ICD-10-CM

## 2022-01-14 ENCOUNTER — Ambulatory Visit: Payer: 59 | Admitting: Psychology

## 2022-01-14 ENCOUNTER — Encounter: Payer: Self-pay | Admitting: Neurology

## 2022-01-15 ENCOUNTER — Other Ambulatory Visit: Payer: Self-pay | Admitting: Family Medicine

## 2022-01-15 DIAGNOSIS — N3281 Overactive bladder: Secondary | ICD-10-CM

## 2022-01-15 DIAGNOSIS — M5137 Other intervertebral disc degeneration, lumbosacral region: Secondary | ICD-10-CM

## 2022-01-15 NOTE — Telephone Encounter (Signed)
Refill request for  Tramadol 50 mg LR 12/18/21, #30, 0 rf  Oxybutynin 5 mg LR 06/21/21, #90, 2 rf LOV 12/26/21 FOV 03/28/22  Please review and advise.  Thanks. Dm/cma

## 2022-01-31 ENCOUNTER — Other Ambulatory Visit (INDEPENDENT_AMBULATORY_CARE_PROVIDER_SITE_OTHER): Payer: Commercial Managed Care - HMO

## 2022-01-31 DIAGNOSIS — E785 Hyperlipidemia, unspecified: Secondary | ICD-10-CM | POA: Diagnosis not present

## 2022-01-31 LAB — LIPID PANEL
Cholesterol: 146 mg/dL (ref 0–200)
HDL: 36 mg/dL — ABNORMAL LOW (ref >39.00)
LDL Cholesterol: 89 mg/dL (ref 0–99)
NonHDL: 110.17
Total CHOL/HDL Ratio: 4
Triglycerides: 108 mg/dL (ref 0.0–149.0)
VLDL: 21.6 mg/dL (ref 0.0–40.0)

## 2022-02-03 MED ORDER — ATORVASTATIN CALCIUM 40 MG PO TABS: 40.0000 mg | ORAL_TABLET | Freq: Every day | ORAL | 3 refills | Status: DC

## 2022-03-03 ENCOUNTER — Other Ambulatory Visit: Payer: Self-pay | Admitting: Family Medicine

## 2022-03-12 ENCOUNTER — Encounter: Payer: Commercial Managed Care - HMO | Admitting: Physical Medicine & Rehabilitation

## 2022-03-17 ENCOUNTER — Other Ambulatory Visit: Payer: Self-pay | Admitting: Family Medicine

## 2022-03-17 DIAGNOSIS — I10 Essential (primary) hypertension: Secondary | ICD-10-CM

## 2022-03-19 ENCOUNTER — Encounter
Payer: Commercial Managed Care - HMO | Attending: Physical Medicine & Rehabilitation | Admitting: Physical Medicine & Rehabilitation

## 2022-03-28 ENCOUNTER — Encounter: Payer: Self-pay | Admitting: Family Medicine

## 2022-03-28 ENCOUNTER — Ambulatory Visit (INDEPENDENT_AMBULATORY_CARE_PROVIDER_SITE_OTHER): Payer: Self-pay | Admitting: Family Medicine

## 2022-03-28 ENCOUNTER — Other Ambulatory Visit (HOSPITAL_COMMUNITY)
Admission: RE | Admit: 2022-03-28 | Discharge: 2022-03-28 | Disposition: A | Discharge status: Home / Self Care | Payer: 59 | Source: Ambulatory Visit | Attending: Family Medicine | Admitting: Family Medicine

## 2022-03-28 VITALS — BP 124/70 | HR 68 | Temp 98.1°F | Ht 65.0 in | Wt 141.8 lb

## 2022-03-28 DIAGNOSIS — Z124 Encounter for screening for malignant neoplasm of cervix: Secondary | ICD-10-CM | POA: Insufficient documentation

## 2022-03-28 DIAGNOSIS — F17210 Nicotine dependence, cigarettes, uncomplicated: Secondary | ICD-10-CM

## 2022-03-28 DIAGNOSIS — E785 Hyperlipidemia, unspecified: Secondary | ICD-10-CM

## 2022-03-28 DIAGNOSIS — I10 Essential (primary) hypertension: Secondary | ICD-10-CM

## 2022-03-28 DIAGNOSIS — Z8673 Personal history of transient ischemic attack (TIA), and cerebral infarction without residual deficits: Secondary | ICD-10-CM

## 2022-03-28 DIAGNOSIS — F17200 Nicotine dependence, unspecified, uncomplicated: Secondary | ICD-10-CM
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
## 2022-03-31 LAB — CYTOLOGY - PAP
Adequacy: ABSENT
Comment: NEGATIVE
Diagnosis: NEGATIVE
High risk HPV: NEGATIVE

## 2022-04-16 ENCOUNTER — Other Ambulatory Visit: Payer: Self-pay

## 2022-04-16 DIAGNOSIS — F5101 Primary insomnia: Secondary | ICD-10-CM

## 2022-04-16 MED ORDER — TRAZODONE HCL 150 MG PO TABS: 150.0000 mg | ORAL_TABLET | Freq: Every day | ORAL | 3 refills | Status: DC

## 2022-04-16 NOTE — Telephone Encounter (Signed)
Refill request for  Tramadol 5 mg LR 01/15/22, #30, 2 rf LOV  03/28/22 FOV   none scheduled.     Please review and advise. Thanks. Dm/cma

## 2022-04-18 ENCOUNTER — Other Ambulatory Visit: Payer: Self-pay

## 2022-04-18 DIAGNOSIS — M5137 Other intervertebral disc degeneration, lumbosacral region: Secondary | ICD-10-CM

## 2022-04-18 MED ORDER — TRAMADOL HCL 50 MG PO TABS: 50.0000 mg | ORAL_TABLET | Freq: Two times a day (BID) | ORAL | 2 refills | Status: DC | PRN

## 2022-04-18 NOTE — Telephone Encounter (Signed)
Refill request for  Tramadol 50 mg LR 01/15/22, #30, 2 rf LOV 11/0//24 FOV  none scheduled.    Please review and advise.   Thanks. Dm/cma

## 2022-04-29 ENCOUNTER — Other Ambulatory Visit: Payer: Self-pay | Admitting: Family Medicine

## 2022-04-29 DIAGNOSIS — F339 Major depressive disorder, recurrent, unspecified: Secondary | ICD-10-CM

## 2022-04-29 DIAGNOSIS — K219 Gastro-esophageal reflux disease without esophagitis: Secondary | ICD-10-CM

## 2022-05-20 ENCOUNTER — Other Ambulatory Visit: Payer: Self-pay | Admitting: Family Medicine

## 2022-05-20 ENCOUNTER — Telehealth: Payer: Self-pay | Admitting: Physical Medicine and Rehabilitation

## 2022-05-20 DIAGNOSIS — M5137 Other intervertebral disc degeneration, lumbosacral region: Secondary | ICD-10-CM

## 2022-05-20 NOTE — Telephone Encounter (Signed)
Patient called needing to schedule an appointment with Dr. Ernestina Patches for her hip. Patient said she is in quite a bit of pain. Patient said it is hard to sit in the car or drive for any length of time. The number to contact patient is [phone]

## 2022-05-20 NOTE — Telephone Encounter (Signed)
Called patient, she had to change pharmacy due to her insurance. Can you please send a new RX for the Tramadol.  Thanks. Dm/cma

## 2022-05-21 NOTE — Telephone Encounter (Addendum)
LVM to return call to clinic. Patient last seen 12/2020

## 2022-05-21 NOTE — Telephone Encounter (Signed)
Spoke with patient and scheduled an OV for 05/22/22 since patient has not been seen since 12/2020

## 2022-05-22 ENCOUNTER — Ambulatory Visit (INDEPENDENT_AMBULATORY_CARE_PROVIDER_SITE_OTHER): Payer: 59 | Admitting: Physical Medicine and Rehabilitation

## 2022-05-22 ENCOUNTER — Encounter: Payer: Self-pay | Admitting: Physical Medicine and Rehabilitation

## 2022-05-22 DIAGNOSIS — M47816 Spondylosis without myelopathy or radiculopathy, lumbar region: Secondary | ICD-10-CM

## 2022-05-22 DIAGNOSIS — M5416 Radiculopathy, lumbar region: Secondary | ICD-10-CM

## 2022-05-22 DIAGNOSIS — R269 Unspecified abnormalities of gait and mobility: Secondary | ICD-10-CM

## 2022-05-22 DIAGNOSIS — G8929 Other chronic pain: Secondary | ICD-10-CM | POA: Diagnosis not present

## 2022-05-22 DIAGNOSIS — M5441 Lumbago with sciatica, right side: Secondary | ICD-10-CM

## 2022-05-22 DIAGNOSIS — Z8673 Personal history of transient ischemic attack (TIA), and cerebral infarction without residual deficits: Secondary | ICD-10-CM

## 2022-05-22 NOTE — Progress Notes (Signed)
Functional Pain Scale - descriptive words and definitions  Immobilizing (10)   Unable to move or talk due to intensity of pain/unable to sleep and unable to use distraction. Severe range order  Average Pain 10  Lower back and right hip pain. Pain has been going on about a week
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
## 2022-05-23 ENCOUNTER — Other Ambulatory Visit: Payer: Self-pay | Admitting: Physical Medicine & Rehabilitation

## 2022-05-27 MED ORDER — KETOROLAC TROMETHAMINE 30 MG/ML IJ SOLN
30.0000 mg | INTRAMUSCULAR | Status: AC | PRN
  Administered 2022-05-22: 30 mg via INTRAMUSCULAR

## 2022-05-28 ENCOUNTER — Other Ambulatory Visit: Payer: Self-pay | Admitting: Family Medicine

## 2022-05-28 DIAGNOSIS — F339 Major depressive disorder, recurrent, unspecified: Secondary | ICD-10-CM

## 2022-05-29 ENCOUNTER — Telehealth: Payer: Self-pay | Admitting: Family Medicine

## 2022-05-29 DIAGNOSIS — M5137 Other intervertebral disc degeneration, lumbosacral region: Secondary | ICD-10-CM

## 2022-05-29 DIAGNOSIS — N3281 Overactive bladder: Secondary | ICD-10-CM

## 2022-05-29 MED ORDER — OXYBUTYNIN CHLORIDE 5 MG PO TABS: 5.0000 mg | ORAL_TABLET | Freq: Three times a day (TID) | ORAL | 2 refills | Status: DC

## 2022-05-29 MED ORDER — TRAMADOL HCL 50 MG PO TABS: 50.0000 mg | ORAL_TABLET | Freq: Two times a day (BID) | ORAL | 2 refills | Status: DC | PRN

## 2022-05-29 NOTE — Telephone Encounter (Signed)
Rx went to the pharmacy. Dm/cma

## 2022-05-29 NOTE — Addendum Note (Signed)
Addended by: Haydee Salter on: 05/29/2022 04:39 PM   Modules accepted: Orders

## 2022-05-29 NOTE — Telephone Encounter (Signed)
Patient aware. Dm/cma

## 2022-05-29 NOTE — Telephone Encounter (Signed)
Can yo please send the Tramadol to [facility] [place].  Called [facility] to cancel RX.  Dm/cma

## 2022-05-29 NOTE — Addendum Note (Signed)
Addended by: Konrad Saha on: 05/29/2022 03:25 PM   Modules accepted: Orders

## 2022-05-29 NOTE — Telephone Encounter (Signed)
Caller Name: Susan Lewis Call back phone #: [phone]   MEDICATION(S):   traZODone (DESYREL) 150 MG tablet  pt states that this medication is not working for her and she has taken 1 1/2 at a time and it still takes 3 hours to work.   traMADol (ULTRAM) 50 MG tablet pt states that she only received 14 doses and this is only a 7 day supply. She asked that it be sent for 30 day supply. Pt aware of the 2 refills on last RX but said it was "ordered wrong".

## 2022-05-29 NOTE — Telephone Encounter (Signed)
Caller Name: Brana Call back phone #: [phone]   MEDICATION(S):   oxybutynin (DITROPAN) 5 MG tablet   Has the patient contacted their pharmacy (YES/NO)? yes What did pharmacy advise? Out of refills  [facility]erred Pharmacy:  [id number] [place], [place] - [id number] Phone: [phone] Fax: [phone]

## 2022-06-04 ENCOUNTER — Telehealth: Payer: Self-pay | Admitting: Family Medicine

## 2022-06-04 ENCOUNTER — Telehealth (INDEPENDENT_AMBULATORY_CARE_PROVIDER_SITE_OTHER): Payer: 59 | Admitting: Family Medicine

## 2022-06-04 ENCOUNTER — Encounter: Payer: Self-pay | Admitting: Family Medicine

## 2022-06-04 ENCOUNTER — Encounter: Payer: 59 | Admitting: Physical Medicine and Rehabilitation

## 2022-06-04 VITALS — Ht 65.0 in | Wt 140.0 lb

## 2022-06-04 DIAGNOSIS — F5101 Primary insomnia: Secondary | ICD-10-CM

## 2022-06-04 DIAGNOSIS — M5137 Other intervertebral disc degeneration, lumbosacral region: Secondary | ICD-10-CM

## 2022-06-04 DIAGNOSIS — M545 Low back pain, unspecified: Secondary | ICD-10-CM

## 2022-06-04 MED ORDER — TRAMADOL HCL 50 MG PO TABS: 50.0000 mg | ORAL_TABLET | Freq: Two times a day (BID) | ORAL | 2 refills | Status: DC | PRN

## 2022-06-04 MED ORDER — TRAZODONE HCL 150 MG PO TABS: 225.0000 mg | ORAL_TABLET | Freq: Every day | ORAL | 3 refills | Status: DC

## 2022-06-04 NOTE — Telephone Encounter (Signed)
Pt called today at 11:05 said that she can't make her appointment today because no gas in her car. Pt want to know Lucianne Lei she do a virtual video for today appointment
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
## 2022-06-04 NOTE — Assessment & Plan Note (Signed)
Chronic low back pain with acute flare. She is scheduled to see the orthopedist on Friday. I will go ahead and provide enough Tramadol for her to take this twice a day. If we can manage with this,. I will continue to follow her for her chronic pain management. If this does not control her pain adequately enough to maintain function, I will consider referral to a pain specialist.

## 2022-06-04 NOTE — Assessment & Plan Note (Signed)
I will have her continue trazodone 225 mg daily for sleep.

## 2022-06-07 ENCOUNTER — Ambulatory Visit (INDEPENDENT_AMBULATORY_CARE_PROVIDER_SITE_OTHER): Payer: 59 | Admitting: Physical Medicine and Rehabilitation

## 2022-06-07 ENCOUNTER — Other Ambulatory Visit: Payer: Self-pay

## 2022-06-07 VITALS — BP 122/53 | HR 70

## 2022-06-07 DIAGNOSIS — M5416 Radiculopathy, lumbar region: Secondary | ICD-10-CM

## 2022-06-07 MED ORDER — METHYLPREDNISOLONE ACETATE 80 MG/ML IJ SUSP
80.0000 mg | Freq: Once | INTRAMUSCULAR | Status: AC
  Administered 2022-06-07: 80 mg

## 2022-06-07 NOTE — Patient Instructions (Signed)

## 2022-06-07 NOTE — Progress Notes (Unsigned)
Functional Pain Scale - descriptive words and definitions  Distressing (6)    Pain is present/unable to complete most ADLs limited by pain/sleep is difficult and active distraction is only marginal. Moderate range order  Average Pain 6   +Driver, -BT, -Dye Allergies.  Lower back pain on right that goes across the back and down the right leg

## 2022-06-12 NOTE — Procedures (Signed)
Lumbosacral Transforaminal Epidural Steroid Injection - Sub-Pedicular Approach with Fluoroscopic Guidance  [name]      Date of Birth: September 17, 1960 MRN: [id number] PCP: Haydee Salter, MD      Visit Date: 06/07/2022   Universal Protocol:    Date/Time: 06/07/2022  Consent Given By: the patient  Position: PRONE  Additional Comments: Vital signs were monitored before and after the procedure. Patient was prepped and draped in the usual sterile fashion. The correct patient, procedure, and site was verified.   Injection Procedure Details:   Procedure diagnoses: Lumbar radiculopathy [M54.16]    Meds Administered:  Meds ordered this encounter  Medications   methylPREDNISolone acetate (DEPO-MEDROL) injection 80 mg    Laterality: Right  Location/Site: L4  Needle:5.0 in., 22 ga.  Short bevel or Quincke spinal needle  Needle Placement: Transforaminal  Findings:    -Comments: Excellent flow of contrast along the nerve, nerve root and into the epidural space.  Procedure Details: After squaring off the end-plates to get a true AP view, the C-arm was positioned so that an oblique view of the foramen as noted above was visualized. The target area is just inferior to the "nose of the scotty dog" or sub pedicular. The soft tissues overlying this structure were infiltrated with 2-3 ml. of 1% Lidocaine without Epinephrine.  The spinal needle was inserted toward the target using a "trajectory" view along the fluoroscope beam.  Under AP and lateral visualization, the needle was advanced so it did not puncture dura and was located close the 6 O'Clock position of the pedical in AP tracterory. Biplanar projections were used to confirm position. Aspiration was confirmed to be negative for CSF and/or blood. A 1-2 ml. volume of Isovue-250 was injected and flow of contrast was noted at each level. Radiographs were obtained for documentation purposes.   After attaining the desired flow of  contrast documented above, a 0.5 to 1.0 ml test dose of 0.25% Marcaine was injected into each respective transforaminal space.  The patient was observed for 90 seconds post injection.  After no sensory deficits were reported, and normal lower extremity motor function was noted,   the above injectate was administered so that equal amounts of the injectate were placed at each foramen (level) into the transforaminal epidural space.   Additional Comments:  No complications occurred Dressing: 2 x 2 sterile gauze and Band-Aid    Post-procedure details: Patient was observed during the procedure. Post-procedure instructions were reviewed.  Patient left the clinic in stable condition.

## 2022-06-12 NOTE — Progress Notes (Signed)
Susan Lewis - 62 y.o. female MRN [id number]  Date of birth: 1961/03/14  Office Visit Note: Visit Date: 06/07/2022 PCP: Haydee Salter, MD Referred by: Haydee Salter, MD  Subjective: Chief Complaint  Patient presents with   Lower Back - Pain   HPI:  Susan Lewis is a 62 y.o. female who comes in today at the request of Barnet Pall, FNP for planned Right L4-5 Lumbar Transforaminal epidural steroid injection with fluoroscopic guidance.  The patient has failed conservative care including home exercise, medications, time and activity modification.  This injection will be diagnostic and hopefully therapeutic.  Please see requesting physician notes for further details and justification.  Clinically she looks much more comfortable since she was in the last visit in the office.  She had Toradol injection and primary care physician has given her some tramadol.   ROS Otherwise per HPI.  Assessment & Plan: Visit Diagnoses:    ICD-10-CM   1. Lumbar radiculopathy  M54.16 XR C-ARM NO REPORT    Epidural Steroid injection    methylPREDNISolone acetate (DEPO-MEDROL) injection 80 mg      Plan: No additional findings.   Meds & Orders:  Meds ordered this encounter  Medications   methylPREDNISolone acetate (DEPO-MEDROL) injection 80 mg    Orders Placed This Encounter  Procedures   XR C-ARM NO REPORT   Epidural Steroid injection    Follow-up: Return for visit to requesting provider as needed.   Procedures: No procedures performed  Lumbosacral Transforaminal Epidural Steroid Injection - Sub-Pedicular Approach with Fluoroscopic Guidance  [name]      Date of Birth: Dec 06, 1960 MRN: [id number] PCP: Haydee Salter, MD      Visit Date: 06/07/2022   Universal Protocol:    Date/Time: 06/07/2022  Consent Given By: the patient  Position: PRONE  Additional Comments: Vital signs were monitored before and after the procedure. Patient was prepped and draped in the usual  sterile fashion. The correct patient, procedure, and site was verified.   Injection Procedure Details:   Procedure diagnoses: Lumbar radiculopathy [M54.16]    Meds Administered:  Meds ordered this encounter  Medications   methylPREDNISolone acetate (DEPO-MEDROL) injection 80 mg    Laterality: Right  Location/Site: L4  Needle:5.0 in., 22 ga.  Short bevel or Quincke spinal needle  Needle Placement: Transforaminal  Findings:    -Comments: Excellent flow of contrast along the nerve, nerve root and into the epidural space.  Procedure Details: After squaring off the end-plates to get a true AP view, the C-arm was positioned so that an oblique view of the foramen as noted above was visualized. The target area is just inferior to the "nose of the scotty dog" or sub pedicular. The soft tissues overlying this structure were infiltrated with 2-3 ml. of 1% Lidocaine without Epinephrine.  The spinal needle was inserted toward the target using a "trajectory" view along the fluoroscope beam.  Under AP and lateral visualization, the needle was advanced so it did not puncture dura and was located close the 6 O'Clock position of the pedical in AP tracterory. Biplanar projections were used to confirm position. Aspiration was confirmed to be negative for CSF and/or blood. A 1-2 ml. volume of Isovue-250 was injected and flow of contrast was noted at each level. Radiographs were obtained for documentation purposes.   After attaining the desired flow of contrast documented above, a 0.5 to 1.0 ml test dose of 0.25% Marcaine was injected into each respective transforaminal space.  The  patient was observed for 90 seconds post injection.  After no sensory deficits were reported, and normal lower extremity motor function was noted,   the above injectate was administered so that equal amounts of the injectate were placed at each foramen (level) into the transforaminal epidural space.   Additional Comments:  No  complications occurred Dressing: 2 x 2 sterile gauze and Band-Aid    Post-procedure details: Patient was observed during the procedure. Post-procedure instructions were reviewed.  Patient left the clinic in stable condition.    Clinical History: EXAM:  MRI LUMBAR SPINE WITHOUT CONTRAST   TECHNIQUE:  Multiplanar, multisequence MR imaging of the lumbar spine was  performed. No intravenous contrast was administered.   COMPARISON:  Prior radiograph from 10/22/2019.   FINDINGS:  Segmentation: Standard.   Alignment: Physiologic with preservation of the normal lumbar  lordosis. No listhesis.   Vertebrae: Vertebral body height maintained without acute or chronic  fracture. Bone marrow signal intensity within normal limits. No  discrete or worrisome osseous lesions. No abnormal marrow edema.   Conus medullaris and cauda equina: Conus extends to the T12-L1  level. Conus and cauda equina appear normal.   Paraspinal and other soft tissues: Unremarkable.   Disc levels:   T10-11: Seen only on sagittal projection. Mild diffuse disc bulge.  No significant spinal stenosis.   T11-12: Disc desiccation without disc bulge.  No stenosis.   T12-L1: Disc desiccation with small central/left paracentral disc  protrusion. No significant stenosis.   L1-2: Diffuse disc bulge with disc desiccation, slightly asymmetric  to the left. Mild bilateral facet hypertrophy. No significant spinal  stenosis. Mild left foraminal narrowing. Right neural foramina  remains patent.   L2-3: Mild disc bulge with disc desiccation. Mild facet hypertrophy.  No significant spinal stenosis. Foramina remain patent.   L3-4: Mild diffuse disc bulge with disc desiccation. Mild facet and  ligament flavum hypertrophy. Borderline mild bilateral lateral  recess stenosis. Central canal remains patent. Mild bilateral L3  foraminal narrowing.   L4-5: Diffuse disc bulge with disc desiccation. Superimposed shallow  right  foraminal to extraforaminal disc protrusion contacts the  exiting right L4 nerve root (series 7, image 24). Moderate facet and  ligament flavum hypertrophy. Resultant mild canal with moderate  bilateral subarticular stenosis. Moderate right worse than left L4  foraminal narrowing.   L5-S1: Minimal disc bulge. Severe right with moderate left facet  arthrosis. No significant spinal stenosis. Mild right worse than  left foraminal narrowing.   IMPRESSION:  1. Shallow right foraminal to extraforaminal disc protrusion at  L4-5, contacting the exiting right L4 nerve root.  2. Disc bulging with facet hypertrophy at L4-5 with resultant  moderate bilateral subarticular stenosis, with moderate right worse  than left L4 foraminal narrowing.  3. Additional mild noncompressive disc bulging and facet hypertrophy  elsewhere within the lumbar spine as above. No other significant  spinal stenosis or evidence for neural impingement.    Electronically Signed    By: Jeannine Boga M.D.    On: 12/21/2019 04:38     Objective:  VS:  HT:    WT:   BMI:     BP:(!) 122/53  HR:70bpm  TEMP: ( )  RESP:  Physical Exam Vitals and nursing note reviewed.  Constitutional:      General: She is not in acute distress.    Appearance: Normal appearance. She is not ill-appearing.  HENT:     Head: Normocephalic and atraumatic.     Right Ear: External ear  normal.     Left Ear: External ear normal.  Eyes:     Extraocular Movements: Extraocular movements intact.  Cardiovascular:     Rate and Rhythm: Normal rate.     Pulses: Normal pulses.  Pulmonary:     Effort: Pulmonary effort is normal. No respiratory distress.  Abdominal:     General: There is no distension.     Palpations: Abdomen is soft.  Musculoskeletal:        General: Tenderness present.     Cervical back: Neck supple.     Right lower leg: No edema.     Left lower leg: No edema.     Comments: Patient has good distal strength with no pain  over the greater trochanters.  No clonus or focal weakness.  Skin:    Findings: No erythema, lesion or rash.  Neurological:     General: No focal deficit present.     Mental Status: She is alert and oriented to person, place, and time.     Sensory: No sensory deficit.     Motor: No weakness or abnormal muscle tone.     Coordination: Coordination normal.  Psychiatric:        Mood and Affect: Mood normal.        Behavior: Behavior normal.      Imaging: No results found.

## 2022-06-18 ENCOUNTER — Other Ambulatory Visit: Payer: Self-pay | Admitting: Family Medicine

## 2022-06-18 DIAGNOSIS — F5101 Primary insomnia: Secondary | ICD-10-CM

## 2022-06-18 NOTE — Telephone Encounter (Signed)
Prescription Request  06/18/2022  LOV: 03/28/2022  What is the name of the medication or equipment? traZODone (DESYREL) 150 MG tablet   Have you contacted your pharmacy to request a refill? No  pt said the doctor have her taking more a day so she has hardly any left  [id number] - [place], [place] - [address] [place] [address] Phone: [phone] Fax: [phone]    Patient notified that their request is being sent to the clinical staff for review and that they should receive a response within 2 business days.   Please advise at Mobile [phone] (mobile)

## 2022-06-19 MED ORDER — TRAZODONE HCL 150 MG PO TABS: 225.0000 mg | ORAL_TABLET | Freq: Every day | ORAL | 3 refills | Status: DC

## 2022-06-19 NOTE — Telephone Encounter (Signed)
[facility] has to use [facility].  Can you please send this to them.  Thanks. Dm/cma

## 2022-06-19 NOTE — Telephone Encounter (Signed)
Patient notified VIA phone. Dm/cma  

## 2022-06-19 NOTE — Addendum Note (Signed)
Addended by: Armandina Gemma L on: 06/19/2022 12:01 PM   Modules accepted: Orders

## 2022-06-20 ENCOUNTER — Other Ambulatory Visit: Payer: Self-pay | Admitting: Family Medicine

## 2022-06-20 DIAGNOSIS — F339 Major depressive disorder, recurrent, unspecified: Secondary | ICD-10-CM

## 2022-06-27 ENCOUNTER — Other Ambulatory Visit: Payer: Self-pay | Admitting: Family Medicine

## 2022-06-27 DIAGNOSIS — F419 Anxiety disorder, unspecified: Secondary | ICD-10-CM

## 2022-06-27 NOTE — Telephone Encounter (Signed)
Refill request for Buspar 15 mg  LR  09/19/21, #60, 5 rf LOV 06/04/22 FOV   none scheduled.   Please review and advise.  Thanks. Dm/cma

## 2022-08-25 ENCOUNTER — Other Ambulatory Visit: Payer: Self-pay | Admitting: Family Medicine

## 2022-08-25 DIAGNOSIS — M5137 Other intervertebral disc degeneration, lumbosacral region: Secondary | ICD-10-CM

## 2022-09-24 ENCOUNTER — Other Ambulatory Visit: Payer: Self-pay | Admitting: Family Medicine

## 2022-09-24 DIAGNOSIS — N3281 Overactive bladder: Secondary | ICD-10-CM

## 2022-10-02 ENCOUNTER — Other Ambulatory Visit: Payer: Self-pay | Admitting: Family Medicine

## 2022-10-02 DIAGNOSIS — M797 Fibromyalgia: Secondary | ICD-10-CM

## 2022-10-02 DIAGNOSIS — M5137 Other intervertebral disc degeneration, lumbosacral region: Secondary | ICD-10-CM

## 2022-10-02 MED ORDER — GABAPENTIN 300 MG PO CAPS: 300.0000 mg | ORAL_CAPSULE | Freq: Two times a day (BID) | ORAL | 3 refills | Status: DC

## 2022-10-02 MED ORDER — TRAMADOL HCL 50 MG PO TABS: 50.0000 mg | ORAL_TABLET | Freq: Two times a day (BID) | ORAL | 2 refills | Status: DC | PRN

## 2022-10-02 NOTE — Telephone Encounter (Signed)
Refill request for  Tramadol 50 mg LR 08/26/22, #30, 2 rf  Gabapentin 300 mg LR 06/21/21, #180, 3 3rf (Daniel Anjiull) LOV 06/04/22 FOV 10/03/22  Please review and advise.  Thanks. Dm/cma

## 2022-10-02 NOTE — Telephone Encounter (Signed)
Prescription Request  10/02/2022  LOV: 03/28/2022  What is the name of the medication or equipment? traMADol (ULTRAM) 50 MG tablet [[id number]] and gabapentin (NEURONTIN) 300 MG capsule [[id number]]   Have you contacted your pharmacy to request a refill? Yes, I let her know it looks another provider gave her the gabapentin  Which pharmacy would you like this sent to?   [id number] - [place], [place] - [address] [place] [address] Phone: [phone] Fax: [phone]    Patient notified that their request is being sent to the clinical staff for review and that they should receive a response within 2 business days.   Please advise at [facility] [phone]

## 2022-10-03 ENCOUNTER — Telehealth: Payer: Self-pay | Admitting: Family Medicine

## 2022-10-03 ENCOUNTER — Ambulatory Visit: Payer: 59 | Admitting: Internal Medicine
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
## 2022-10-03 NOTE — Telephone Encounter (Signed)
7.18.24 no show letter sent

## 2022-10-09 NOTE — Telephone Encounter (Signed)
Pt had flat tire. Fee waived.

## 2022-10-21 ENCOUNTER — Other Ambulatory Visit: Payer: Self-pay | Admitting: Family Medicine

## 2022-10-21 DIAGNOSIS — F5101 Primary insomnia: Secondary | ICD-10-CM

## 2022-11-21 ENCOUNTER — Telehealth: Payer: Self-pay

## 2022-11-21 NOTE — Telephone Encounter (Signed)
Lft VM to rtn call to see if she has changed pharmacy's due to receiving a request from [facility].  In chart she is using [facility].  Dm/cma

## 2022-12-08 DIAGNOSIS — S42402A Unspecified fracture of lower end of left humerus, initial encounter for closed fracture: Secondary | ICD-10-CM | POA: Insufficient documentation

## 2022-12-09 DIAGNOSIS — M85822 Other specified disorders of bone density and structure, left upper arm: Secondary | ICD-10-CM | POA: Insufficient documentation

## 2022-12-19 ENCOUNTER — Telehealth: Payer: Self-pay | Admitting: Family Medicine

## 2022-12-20 NOTE — Telephone Encounter (Signed)
error 

## 2022-12-25 ENCOUNTER — Encounter: Payer: Self-pay | Admitting: Family Medicine

## 2022-12-25 ENCOUNTER — Ambulatory Visit (INDEPENDENT_AMBULATORY_CARE_PROVIDER_SITE_OTHER): Payer: 59 | Admitting: Family Medicine

## 2022-12-25 VITALS — BP 120/74 | HR 54 | Temp 98.0°F | Ht 65.0 in | Wt 129.0 lb

## 2022-12-25 DIAGNOSIS — F339 Major depressive disorder, recurrent, unspecified: Secondary | ICD-10-CM

## 2022-12-25 DIAGNOSIS — I10 Essential (primary) hypertension: Secondary | ICD-10-CM

## 2022-12-25 DIAGNOSIS — R4701 Aphasia: Secondary | ICD-10-CM

## 2022-12-25 DIAGNOSIS — E785 Hyperlipidemia, unspecified: Secondary | ICD-10-CM

## 2022-12-25 DIAGNOSIS — Z8673 Personal history of transient ischemic attack (TIA), and cerebral infarction without residual deficits: Secondary | ICD-10-CM

## 2022-12-25 DIAGNOSIS — F4310 Post-traumatic stress disorder, unspecified: Secondary | ICD-10-CM

## 2022-12-25 DIAGNOSIS — Z23 Encounter for immunization: Secondary | ICD-10-CM | POA: Diagnosis not present

## 2022-12-25 DIAGNOSIS — I69311 Memory deficit following cerebral infarction: Secondary | ICD-10-CM | POA: Insufficient documentation

## 2022-12-25 LAB — BASIC METABOLIC PANEL
BUN: 11 mg/dL (ref 6–23)
CO2: 33 meq/L — ABNORMAL HIGH (ref 19–32)
Calcium: 8.9 mg/dL (ref 8.4–10.5)
Chloride: 104 meq/L (ref 96–112)
Creatinine, Ser: 0.87 mg/dL (ref 0.40–1.20)
GFR: 70.29 mL/min (ref >60.00)
Glucose, Bld: 75 mg/dL (ref 70–99)
Potassium: 4.3 meq/L (ref 3.5–5.1)
Sodium: 143 meq/L (ref 135–145)

## 2022-12-25 LAB — LIPID PANEL
Cholesterol: 99 mg/dL (ref 0–200)
HDL: 34.3 mg/dL — ABNORMAL LOW (ref >39.00)
LDL Cholesterol: 39 mg/dL (ref 0–99)
NonHDL: 64.54
Total CHOL/HDL Ratio: 3
Triglycerides: 126 mg/dL (ref 0.0–149.0)
VLDL: 25.2 mg/dL (ref 0.0–40.0)

## 2022-12-25 NOTE — Assessment & Plan Note (Addendum)
I will refer her for OT and ST. I will also have her follow-up with neurology regarding her prior stroke and her current issues with memory. This is likely an aspect of dementia related to her prior stroke. I strongly encouraged her to resume her daily aspirin for stroke prevention.

## 2022-12-25 NOTE — Assessment & Plan Note (Signed)
As above. I will refer for counseling.

## 2022-12-25 NOTE — Assessment & Plan Note (Signed)
This continues to be a challenge for her. I will have her return to see speech therapy and occupational therapy to see if we can further improve her function.

## 2022-12-25 NOTE — Assessment & Plan Note (Signed)
I will check lipids today. Continue atorvastatin 20 mg daily.

## 2022-12-25 NOTE — Assessment & Plan Note (Signed)
I will have her see Dr. Everlena Cooper for follow-up. I will also have OT work with her regarding managing with memory issues.
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
## 2022-12-25 NOTE — Assessment & Plan Note (Signed)
Blood pressure is in good control. Continue amlodipine 5 mg daily. I will check renal function today.
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
## 2022-12-26 ENCOUNTER — Encounter: Payer: Self-pay | Admitting: Physician Assistant

## 2022-12-30 ENCOUNTER — Telehealth: Payer: Self-pay | Admitting: Family Medicine

## 2022-12-30 DIAGNOSIS — K219 Gastro-esophageal reflux disease without esophagitis: Secondary | ICD-10-CM

## 2022-12-30 MED ORDER — OMEPRAZOLE 40 MG PO CPDR: 40.0000 mg | DELAYED_RELEASE_CAPSULE | Freq: Every day | ORAL | 3 refills | Status: DC

## 2022-12-30 NOTE — Telephone Encounter (Signed)
Prescription Request  12/30/2022  LOV: 12/25/2022  What is the name of the medication or equipment? omeprazole (PRILOSEC) 40 MG capsule [[id number]] and meloxicam (MOBIC) 15 MG tablet [[id number]]   Have you contacted your pharmacy to request a refill? Yes   Which pharmacy would you like this sent to?    [id number] - [place], [place] - [address] [place] [address] Phone: [phone] Fax: [phone]    Patient notified that their request is being sent to the clinical staff for review and that they should receive a response within 2 business days.   Please advise at [facility] [phone]

## 2022-12-30 NOTE — Telephone Encounter (Signed)
RX sent to the pharmacy. Patient notified VIA phone.  Dm/cma

## 2023-01-09 ENCOUNTER — Other Ambulatory Visit: Payer: Self-pay | Admitting: Family Medicine

## 2023-01-09 ENCOUNTER — Ambulatory Visit: Payer: 59 | Admitting: Occupational Therapy

## 2023-01-09 ENCOUNTER — Ambulatory Visit: Payer: 59 | Attending: Family Medicine | Admitting: Speech Pathology

## 2023-01-09 ENCOUNTER — Other Ambulatory Visit (HOSPITAL_COMMUNITY): Payer: Self-pay

## 2023-01-09 DIAGNOSIS — M51379 Other intervertebral disc degeneration, lumbosacral region without mention of lumbar back pain or lower extremity pain: Secondary | ICD-10-CM

## 2023-01-09 MED ORDER — TRAMADOL HCL 50 MG PO TABS
50.0000 mg | ORAL_TABLET | Freq: Two times a day (BID) | ORAL | 2 refills | Status: DC | PRN
Start: 2023-01-09 — End: 2023-02-03
  Filled 2023-01-09: qty 30, 15d supply, fill #0
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
## 2023-01-09 NOTE — Telephone Encounter (Signed)
Refill request for  Tramadol 50 mg LR 10/02/22, #30, 2 rf LOV  12/25/22 FOV  03/27/23  Please review and advise.  Thanks. Dm/cma

## 2023-01-09 NOTE — Telephone Encounter (Signed)
Can you re-send to the [facility].   Thanks.  Dm/cma
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
## 2023-01-09 NOTE — Telephone Encounter (Signed)
Refill request  0 left  traMADol (ULTRAM) 50 MG tablet [[id number]]   [id number] - [place], [place] - [address], [place] [address] Phone: [phone]  Fax: [phone]

## 2023-01-10 NOTE — Telephone Encounter (Signed)
Patient notified VIA phone. Dm/cma  

## 2023-01-10 NOTE — Addendum Note (Signed)
Addended by: Waymond Cera on: 01/10/2023 09:26 AM   Modules accepted: Orders

## 2023-01-27 ENCOUNTER — Other Ambulatory Visit (INDEPENDENT_AMBULATORY_CARE_PROVIDER_SITE_OTHER): Payer: 59

## 2023-01-27 ENCOUNTER — Encounter: Payer: Self-pay | Admitting: Physician Assistant

## 2023-01-27 ENCOUNTER — Telehealth: Payer: Self-pay | Admitting: Physical Medicine and Rehabilitation

## 2023-01-27 ENCOUNTER — Ambulatory Visit (INDEPENDENT_AMBULATORY_CARE_PROVIDER_SITE_OTHER): Payer: 59 | Admitting: Physician Assistant

## 2023-01-27 VITALS — BP 118/72 | HR 71 | Ht 65.0 in | Wt 128.0 lb

## 2023-01-27 DIAGNOSIS — Z8673 Personal history of transient ischemic attack (TIA), and cerebral infarction without residual deficits: Secondary | ICD-10-CM

## 2023-01-27 DIAGNOSIS — R413 Other amnesia: Secondary | ICD-10-CM

## 2023-01-27 LAB — TSH: TSH: 2.05 u[IU]/mL (ref 0.35–5.50)

## 2023-01-27 MED ORDER — DONEPEZIL HCL 5 MG PO TABS
5.0000 mg | ORAL_TABLET | Freq: Every day | ORAL | 11 refills | Status: AC
Start: 2023-01-27 — End: ?

## 2023-01-27 NOTE — Patient Instructions (Addendum)
It was a pleasure to see you today at our office.   Recommendations:   MRI of the brain, the radiology office will call you to arrange you appointment   Check labs today   Start donepezil 5 mg daily for memory  Recommend resuming the psychiatric care  Continue pain clinic Follow up in 3 months    For psychiatric meds, mood meds: Please have your primary care physician manage these medications.  If you have any severe symptoms of a stroke, or other severe issues such as confusion,severe chills or fever, etc call 911 or go to the ER as you may need to be evaluated further   For guidance regarding [facility] and if placement were needed at the facility, contact Social Worker tel: [phone]  For assessment of decision of mental capacity and competency:  Call Dr. Erick Blinks, geriatric psychiatrist at [phone]  Counseling regarding caregiver distress, including caregiver depression, anxiety and issues regarding community resources, adult day care programs, adult living facilities, or memory care questions:  please contact your  Primary Doctor's Social Worker   Whom to call: Memory  decline, memory medications: Call our office [phone]    [email or web address]   RECOMMENDATIONS FOR ALL PATIENTS WITH MEMORY PROBLEMS: 1. Continue to exercise (Recommend 30 minutes of walking everyday, or 3 hours every week) 2. Increase social interactions - continue going to [place] and enjoy social gatherings with friends and family 3. Eat healthy, avoid fried foods and eat more fruits and vegetables 4. Maintain adequate blood pressure, blood sugar, and blood cholesterol level. Reducing the risk of stroke and cardiovascular disease also helps promoting better memory. 5. Avoid stressful situations. Live a simple life and avoid aggravations. Organize your time and prepare for the next day in anticipation. 6. Sleep well,  avoid any interruptions of sleep and avoid any distractions in the bedroom that may interfere with adequate sleep quality 7. Avoid sugar, avoid sweets as there is a strong link between excessive sugar intake, diabetes, and cognitive impairment We discussed the Mediterranean diet, which has been shown to help patients reduce the risk of progressive memory disorders and reduces cardiovascular risk. This includes eating fish, eat fruits and green leafy vegetables, nuts like almonds and hazelnuts, walnuts, and also use olive oil. Avoid fast foods and fried foods as much as possible. Avoid sweets and sugar as sugar use has been linked to worsening of memory function.  There is always a concern of gradual progression of memory problems. If this is the case, then we may need to adjust level of care according to patient needs. Support, both to the patient and caregiver, should then be put into place.      You have been referred for a neuropsychological evaluation (i.e., evaluation of memory and thinking abilities). Please bring someone with you to this appointment if possible, as it is helpful for the doctor to hear from both you and another adult who knows you well. Please bring eyeglasses and hearing aids if you wear them.    The evaluation will take approximately 3 hours and has two parts:   The first part is a clinical interview with the neuropsychologist (Dr. Milbert Coulter or Dr. Roseanne Reno). During the interview, the neuropsychologist will speak with you and the individual you brought to the appointment.    The second part of the evaluation is testing with the doctor's technician Annabelle Harman or Selena Batten). During the testing, the technician will ask you to remember different types of material, solve problems,  and answer some questionnaires. Your family member will not be present for this portion of the evaluation.   Please note: We must reserve several hours of the neuropsychologist's time and the psychometrician's time for  your evaluation appointment. As such, there is a No-Show fee of $100. If you are unable to attend any of your appointments, please contact our office as soon as possible to reschedule.      DRIVING: Regarding driving, in patients with progressive memory problems, driving will be impaired. We advise to have someone else do the driving if trouble finding directions or if minor accidents are reported. Independent driving assessment is available to determine safety of driving.   If you are interested in the driving assessment, you can contact the following:  [employer] in [place] [phone]  [facility] [phone]  [facility] [phone]  [facility] [phone] or [phone]   FALL PRECAUTIONS: Be cautious when walking. Scan the area for obstacles that may increase the risk of trips and falls. When getting up in the mornings, sit up at the edge of the bed for a few minutes before getting out of bed. Consider elevating the bed at the head end to avoid drop of blood pressure when getting up. Walk always in a well-lit room (use night lights in the walls). Avoid area rugs or power cords from appliances in the middle of the walkways. Use a walker or a cane if necessary and consider physical therapy for balance exercise. Get your eyesight checked regularly.  FINANCIAL OVERSIGHT: Supervision, especially oversight when making financial decisions or transactions is also recommended.  HOME SAFETY: Consider the safety of the kitchen when operating appliances like stoves, microwave oven, and blender. Consider having supervision and share cooking responsibilities until no longer able to participate in those. Accidents with firearms and other hazards in the house should be identified and addressed as well.   ABILITY TO BE LEFT ALONE: If patient is unable to contact 911 operator, consider using LifeLine, or when the need is there, arrange for  someone to stay with patients. Smoking is a fire hazard, consider supervision or cessation. Risk of wandering should be assessed by caregiver and if detected at any point, supervision and safe proof recommendations should be instituted.  MEDICATION SUPERVISION: Inability to self-administer medication needs to be constantly addressed. Implement a mechanism to ensure safe administration of the medications.      Mediterranean Diet A Mediterranean diet refers to food and lifestyle choices that are based on the traditions of countries located on the [place]. This way of eating has been shown to help prevent certain conditions and improve outcomes for people who have chronic diseases, like kidney disease and heart disease. What are tips for following this plan? Lifestyle  Cook and eat meals together with your family, when possible. Drink enough fluid to keep your urine clear or pale yellow. Be physically active every day. This includes: Aerobic exercise like running or swimming. Leisure activities like gardening, walking, or housework. Get 7-8 hours of sleep each night. If recommended by your health care provider, drink red wine in moderation. This means 1 glass a day for nonpregnant women and 2 glasses a day for men. A glass of wine equals 5 oz (150 mL). Reading food labels  Check the serving size of packaged foods. For foods such as rice and pasta, the serving size refers to the amount of cooked product, not dry. Check the total fat in packaged foods. Avoid foods  that have saturated fat or trans fats. Check the ingredients list for added sugars, such as corn syrup. Shopping  At the grocery store, buy most of your food from the areas near the walls of the store. This includes: Fresh fruits and vegetables (produce). Grains, beans, nuts, and seeds. Some of these may be available in unpackaged forms or large amounts (in bulk). Fresh seafood. Poultry and eggs. Low-fat dairy  products. Buy whole ingredients instead of prepackaged foods. Buy fresh fruits and vegetables in-season from local farmers markets. Buy frozen fruits and vegetables in resealable bags. If you do not have access to quality fresh seafood, buy precooked frozen shrimp or canned fish, such as tuna, salmon, or sardines. Buy small amounts of raw or cooked vegetables, salads, or olives from the deli or salad bar at your store. Stock your pantry so you always have certain foods on hand, such as olive oil, canned tuna, canned tomatoes, rice, pasta, and beans. Cooking  Cook foods with extra-virgin olive oil instead of using butter or other vegetable oils. Have meat as a side dish, and have vegetables or grains as your main dish. This means having meat in small portions or adding small amounts of meat to foods like pasta or stew. Use beans or vegetables instead of meat in common dishes like chili or lasagna. Experiment with different cooking methods. Try roasting or broiling vegetables instead of steaming or sauteing them. Add frozen vegetables to soups, stews, pasta, or rice. Add nuts or seeds for added healthy fat at each meal. You can add these to yogurt, salads, or vegetable dishes. Marinate fish or vegetables using olive oil, lemon juice, garlic, and fresh herbs. Meal planning  Plan to eat 1 vegetarian meal one day each week. Try to work up to 2 vegetarian meals, if possible. Eat seafood 2 or more times a week. Have healthy snacks readily available, such as: Vegetable sticks with hummus. Greek yogurt. Fruit and nut trail mix. Eat balanced meals throughout the week. This includes: Fruit: 2-3 servings a day Vegetables: 4-5 servings a day Low-fat dairy: 2 servings a day Fish, poultry, or lean meat: 1 serving a day Beans and legumes: 2 or more servings a week Nuts and seeds: 1-2 servings a day Whole grains: 6-8 servings a day Extra-virgin olive oil: 3-4 servings a day Limit red meat and sweets  to only a few servings a month What are my food choices? Mediterranean diet Recommended Grains: Whole-grain pasta. Brown rice. Bulgar wheat. Polenta. Couscous. Whole-wheat bread. Orpah Cobb. Vegetables: Artichokes. Beets. Broccoli. Cabbage. Carrots. Eggplant. Green beans. Chard. Kale. Spinach. Onions. Leeks. Peas. Squash. Tomatoes. Peppers. Radishes. Fruits: Apples. Apricots. Avocado. Berries. Bananas. Cherries. Dates. Figs. Grapes. Lemons. Melon. Oranges. Peaches. Plums. Pomegranate. Meats and other protein foods: Beans. Almonds. Sunflower seeds. Pine nuts. Peanuts. Cod. Salmon. Scallops. Shrimp. Tuna. Tilapia. Clams. Oysters. Eggs. Dairy: Low-fat milk. Cheese. Greek yogurt. Beverages: Water. Red wine. Herbal tea. Fats and oils: Extra virgin olive oil. Avocado oil. Grape seed oil. Sweets and desserts: [place] yogurt with honey. Baked apples. Poached pears. Trail mix. Seasoning and other foods: Basil. Cilantro. Coriander. Cumin. Mint. Parsley. Sage. Rosemary. Tarragon. Garlic. Oregano. Thyme. Pepper. Balsalmic vinegar. Tahini. Hummus. Tomato sauce. Olives. Mushrooms. Limit these Grains: Prepackaged pasta or rice dishes. Prepackaged cereal with added sugar. Vegetables: Deep fried potatoes (french fries). Fruits: Fruit canned in syrup. Meats and other protein foods: Beef. Pork. Lamb. Poultry with skin. Hot dogs. Tomasa Blase. Dairy: Ice cream. Sour cream. Whole milk. Beverages: Juice. Sugar-sweetened soft drinks.  Beer. Liquor and spirits. Fats and oils: Butter. Canola oil. Vegetable oil. Beef fat (tallow). Lard. Sweets and desserts: Cookies. Cakes. Pies. Candy. Seasoning and other foods: Mayonnaise. Premade sauces and marinades. The items listed may not be a complete list. Talk with your dietitian about what dietary choices are right for you. Summary The Mediterranean diet includes both food and lifestyle choices. Eat a variety of fresh fruits and vegetables, beans, nuts, seeds, and whole  grains. Limit the amount of red meat and sweets that you eat. Talk with your health care provider about whether it is safe for you to drink red wine in moderation. This means 1 glass a day for nonpregnant women and 2 glasses a day for men. A glass of wine equals 5 oz (150 mL). This information is not intended to replace advice given to you by your health care provider. Make sure you discuss any questions you have with your health care provider. Document Released: 10/26/2015 Document Revised: 11/28/2015 Document Reviewed: 10/26/2015 Elsevier Interactive Patient Education  2017 [employer].
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
## 2023-01-27 NOTE — Telephone Encounter (Signed)
Pt called requesting an appt back injection with dr Alvester Morin. Please call pt at [phone].

## 2023-01-28 LAB — VITAMIN B12: Vitamin B-12: 653 pg/mL (ref 200–1100)

## 2023-01-28 NOTE — Progress Notes (Signed)
Blood tests are normal, thanks

## 2023-01-29 ENCOUNTER — Ambulatory Visit (INDEPENDENT_AMBULATORY_CARE_PROVIDER_SITE_OTHER): Payer: 59 | Admitting: Physical Medicine and Rehabilitation

## 2023-01-29 ENCOUNTER — Encounter: Payer: Self-pay | Admitting: Physical Medicine and Rehabilitation

## 2023-01-29 DIAGNOSIS — M5116 Intervertebral disc disorders with radiculopathy, lumbar region: Secondary | ICD-10-CM

## 2023-01-29 DIAGNOSIS — M5416 Radiculopathy, lumbar region: Secondary | ICD-10-CM

## 2023-01-29 DIAGNOSIS — M47816 Spondylosis without myelopathy or radiculopathy, lumbar region: Secondary | ICD-10-CM | POA: Diagnosis not present

## 2023-01-29 DIAGNOSIS — G8929 Other chronic pain: Secondary | ICD-10-CM

## 2023-01-29 DIAGNOSIS — M5441 Lumbago with sciatica, right side: Secondary | ICD-10-CM
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
## 2023-02-03 ENCOUNTER — Other Ambulatory Visit: Payer: Self-pay | Admitting: Family Medicine

## 2023-02-03 DIAGNOSIS — M51379 Other intervertebral disc degeneration, lumbosacral region without mention of lumbar back pain or lower extremity pain: Secondary | ICD-10-CM

## 2023-02-14 ENCOUNTER — Other Ambulatory Visit: Payer: Self-pay | Admitting: Family Medicine

## 2023-02-14 DIAGNOSIS — N3281 Overactive bladder: Secondary | ICD-10-CM

## 2023-02-15 ENCOUNTER — Other Ambulatory Visit: Payer: Self-pay | Admitting: Family Medicine

## 2023-02-23 ENCOUNTER — Other Ambulatory Visit: Payer: Self-pay | Admitting: Family

## 2023-02-23 DIAGNOSIS — M51379 Other intervertebral disc degeneration, lumbosacral region without mention of lumbar back pain or lower extremity pain: Secondary | ICD-10-CM

## 2023-02-25 ENCOUNTER — Encounter: Payer: 59 | Admitting: Physical Medicine and Rehabilitation

## 2023-03-03 ENCOUNTER — Telehealth: Payer: Self-pay | Admitting: Family Medicine

## 2023-03-03 NOTE — Telephone Encounter (Signed)
Lft VM to RTN call. Dm/cma  

## 2023-03-03 NOTE — Telephone Encounter (Signed)
Please return pts call

## 2023-03-03 NOTE — Telephone Encounter (Signed)
Pt said please call her so she discuss to you about several medications

## 2023-03-04 ENCOUNTER — Telehealth: Payer: Self-pay

## 2023-03-04 ENCOUNTER — Other Ambulatory Visit (HOSPITAL_COMMUNITY): Payer: Self-pay

## 2023-03-04 NOTE — Telephone Encounter (Signed)
Pharmacy Patient Advocate Encounter  Received notification from [facility] that Prior Authorization for Omeprazole 40mg  caps has been APPROVED from 03/04/23 to 03/03/24   PA #/Case ID/Reference #: [id number]

## 2023-03-04 NOTE — Telephone Encounter (Signed)
Spoke to patient and she states that she hasn't been able to get her Omeprazole filled.  Called pharmacy and per insurance they need a PA for everyday use (plan).   Can you please and thank you start a PA for Omeprazole 40 mg? Thanks. Dm/cma

## 2023-03-04 NOTE — Telephone Encounter (Signed)
Pharmacy Patient Advocate Encounter   Received notification from Pt Calls Messages that prior authorization for Omeprazole 40mg  caps is required/requested.   Insurance verification completed.   The patient is insured through [facility] .   Per test claim: PA required; PA submitted to above mentioned insurance via CoverMyMeds Key/confirmation #/EOC [id number] Status is pending

## 2023-03-05 ENCOUNTER — Encounter: Payer: Self-pay | Admitting: Family Medicine

## 2023-03-05 ENCOUNTER — Ambulatory Visit (INDEPENDENT_AMBULATORY_CARE_PROVIDER_SITE_OTHER): Payer: 59 | Admitting: Family Medicine

## 2023-03-05 VITALS — BP 126/74 | HR 95 | Temp 97.8°F | Ht 65.0 in | Wt 139.6 lb

## 2023-03-05 DIAGNOSIS — M19041 Primary osteoarthritis, right hand: Secondary | ICD-10-CM

## 2023-03-05 DIAGNOSIS — Z9889 Other specified postprocedural states: Secondary | ICD-10-CM | POA: Diagnosis not present

## 2023-03-05 DIAGNOSIS — M5137 Other intervertebral disc degeneration, lumbosacral region with discogenic back pain only: Secondary | ICD-10-CM | POA: Diagnosis not present

## 2023-03-05 DIAGNOSIS — M25522 Pain in left elbow: Secondary | ICD-10-CM

## 2023-03-05 DIAGNOSIS — H6993 Unspecified Eustachian tube disorder, bilateral: Secondary | ICD-10-CM | POA: Diagnosis not present

## 2023-03-05 DIAGNOSIS — M19042 Primary osteoarthritis, left hand: Secondary | ICD-10-CM

## 2023-03-05 DIAGNOSIS — Z8781 Personal history of (healed) traumatic fracture: Secondary | ICD-10-CM

## 2023-03-05 DIAGNOSIS — M51379 Other intervertebral disc degeneration, lumbosacral region without mention of lumbar back pain or lower extremity pain: Secondary | ICD-10-CM

## 2023-03-05 MED ORDER — TRAMADOL HCL 50 MG PO TABS: 50.0000 mg | ORAL_TABLET | Freq: Two times a day (BID) | ORAL | 2 refills | Status: DC | PRN
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
## 2023-03-05 NOTE — Assessment & Plan Note (Signed)
Chronic low back pain which is stable. Continue tramadol 50 mg bid.

## 2023-03-05 NOTE — Assessment & Plan Note (Addendum)
Discussed management approaches using hot soaks, wearing gloves in cold weather, and taking Tylenol as needed. I will continue tramadol.

## 2023-03-13 ENCOUNTER — Other Ambulatory Visit: Payer: Self-pay | Admitting: Family Medicine

## 2023-03-13 DIAGNOSIS — I10 Essential (primary) hypertension: Secondary | ICD-10-CM

## 2023-03-27 ENCOUNTER — Ambulatory Visit: Payer: 59 | Admitting: Family Medicine

## 2023-04-01 ENCOUNTER — Other Ambulatory Visit (HOSPITAL_COMMUNITY): Payer: Self-pay

## 2023-04-01 ENCOUNTER — Ambulatory Visit: Payer: Self-pay | Admitting: Family Medicine

## 2023-04-01 DIAGNOSIS — F5101 Primary insomnia: Secondary | ICD-10-CM

## 2023-04-01 MED ORDER — TRAZODONE HCL 150 MG PO TABS: 150.0000 mg | ORAL_TABLET | Freq: Every day | ORAL | 1 refills | Status: DC

## 2023-04-01 MED ORDER — TRAZODONE HCL 150 MG PO TABS
150.0000 mg | ORAL_TABLET | Freq: Every day | ORAL | 1 refills | Status: DC
  Filled 2023-04-01: qty 30, 30d supply, fill #0

## 2023-04-01 NOTE — Telephone Encounter (Signed)
Patient notified VIA phone.  No questions.  Dm/cma ? ?

## 2023-04-01 NOTE — Telephone Encounter (Signed)
 Patient is out of her Trazodone 150 mg x 2 days.    LR 10/21/22, #90, 1 rf  Please review and advise.  Thanks. Dm/cma

## 2023-04-01 NOTE — Telephone Encounter (Signed)
 Copied from CRM [id number]. Topic: Clinical - Red Word Triage >> Apr 01, 2023  9:34 AM Leila BROCKS wrote: Red Word that prompted transfer to Nurse Triage: Patient [phone] states didn't have Trazdone last night, she has really bad side effects, like withdraws, shaking, crying because she didn't sleep last night.   TraZODone  (DESYREL ) 150 MG tablet 1 tablet and a half pill qhs and is out of meds. The next refill 04/10/23, there's an issues with the medications directions. [id number] [place], [place] - [address] [place] [address] Phone: [phone] Fax: [phone]   Chief Complaint: Medication Request/Question Symptoms: Shakiness, Insomnia, Crying Frequency: Acute Pertinent Negatives: Patient denies distress like symptoms.  Disposition: [] ED /[] Urgent Care (no appt availability in office) / [] Appointment(In office/virtual)/ []  [facility]/ [] Home Care/ [] Refused Recommended Disposition /[] [facility]/ [x]  Follow-up with PCP Additional Notes: J. Heuberger is being triaged today for shakiness, insomnia, and a medication refill. The patient reports increasing her Trazodone  to 150 mg (A Pill and a half) to help her sleep and stop her shakiness. The patient reports being unable to sleep or function without this medication, Connected to CAL due to importance and nature of medication. PCP office assisted patient with follow up with medication.    Answer Assessment - Initial Assessment Questions 1. DRUG NAME: What medicine do you need to have refilled?     Trazodone  150 MG tablet  2. REFILLS REMAINING: How many refills are remaining? (Note: The label on the medicine or pill bottle will show how many refills are remaining. If there are no refills remaining, then a renewal may be needed.)     Unsure  3. EXPIRATION DATE: What is the expiration date? (Note: The label states when the prescription will expire, and thus can  no longer be refilled.)     Unsure  4. PRESCRIBING HCP: Who prescribed it? Reason: If prescribed by specialist, call should be referred to that group.     Garnette Simpler, MD  5. SYMPTOMS: Do you have any symptoms?     Insomnia, Shakiness  6. PREGNANCY: Is there any chance that you are pregnant? When was your last menstrual period?     No  Protocols used: Medication Refill and Renewal Call-A-AH

## 2023-04-01 NOTE — Telephone Encounter (Signed)
 Can [facility] [place]?  Sorry about that. Dm/cma

## 2023-04-15 ENCOUNTER — Other Ambulatory Visit: Payer: Self-pay

## 2023-04-15 DIAGNOSIS — K219 Gastro-esophageal reflux disease without esophagitis: Secondary | ICD-10-CM

## 2023-04-15 DIAGNOSIS — M797 Fibromyalgia: Secondary | ICD-10-CM

## 2023-04-15 DIAGNOSIS — M51379 Other intervertebral disc degeneration, lumbosacral region without mention of lumbar back pain or lower extremity pain: Secondary | ICD-10-CM

## 2023-04-15 NOTE — Telephone Encounter (Signed)
Copied from CRM [id number]. Topic: Clinical - Prescription Issue >> Apr 15, 2023  3:56 PM Theodis Sato wrote: Reason for CRM: Patient is requesting all of her medications to be sent to her new preferred pharmacy [facility] [address] - [place], [place] - [address], [address], [place] [address] Phone: [phone]  Fax: [phone]    Patient states she almost out of all of her medications and [facility] states they have not received this.

## 2023-04-16 ENCOUNTER — Other Ambulatory Visit: Payer: Self-pay | Admitting: Family Medicine

## 2023-04-16 DIAGNOSIS — F339 Major depressive disorder, recurrent, unspecified: Secondary | ICD-10-CM

## 2023-04-18 MED ORDER — OMEPRAZOLE 40 MG PO CPDR: 40.0000 mg | DELAYED_RELEASE_CAPSULE | Freq: Every day | ORAL | 3 refills | Status: AC

## 2023-04-18 MED ORDER — GABAPENTIN 300 MG PO CAPS: 300.0000 mg | ORAL_CAPSULE | Freq: Two times a day (BID) | ORAL | 3 refills | Status: DC

## 2023-04-18 MED ORDER — TRAMADOL HCL 50 MG PO TABS: 50.0000 mg | ORAL_TABLET | Freq: Two times a day (BID) | ORAL | 2 refills | Status: DC | PRN

## 2023-04-18 NOTE — Addendum Note (Signed)
Addended by: Waymond Cera on: 04/18/2023 01:55 PM   Modules accepted: Orders

## 2023-04-18 NOTE — Telephone Encounter (Signed)
 Lft VM to rtn call. Dm/cma

## 2023-04-18 NOTE — Telephone Encounter (Signed)
Refills due to changing pharmacy: Tramadol, Gabapentin Omeprazole and Sertraline.     Please review and advise.  Thanks. Dm/cma

## 2023-04-18 NOTE — Telephone Encounter (Signed)
Copied from CRM [id number]. Topic: Clinical - Medication Question >> Apr 18, 2023  9:39 AM Efraim Kaufmann C wrote: Reason for CRM: patient wants to change all of her prescriptions [facility] to [facility] [address] [place] Phone: [phone] Fax: [phone]

## 2023-04-29 ENCOUNTER — Ambulatory Visit: Payer: 59 | Admitting: Physician Assistant

## 2023-04-29 ENCOUNTER — Encounter: Payer: Self-pay | Admitting: Physician Assistant

## 2023-04-29 NOTE — Progress Notes (Incomplete)
Assessment/Plan:   Memory Impairment   Susan Lewis is a very pleasant 63 y.o. 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 Patient is on donepezil 5 mg daily, tolerating well.  Today, MRI of the brain has not been performed.  Etiology is likely multifactorial. She continues to smoke.***     Recommendations:   Follow up in   months. Continue donepezil 5 mg daily Proceeding with MRI of the brain to further evaluate structural abnormalities and vascular load Recommend resuming psychiatric follow-up for PTSD, anxiety and depression Recommend good control of cardiovascular risk factors Continue pain control at the pain clinic Tobacco cessation was recommended Continue to control mood as per PCP   History of left M2 branch occlusion with left frontal insular infarct and left carotid artery occlusion Status post mechanical thrombectomy with residual right-sided monoparesis and expressive aphasia   Secondary stroke prevention: Aspirin 81 mg daily Atorvastatin 20 mg daily with LDL goal less than 70 Normotensive blood pressure A1c goal less than 7 Tobacco cessation Mediterranean diet  Subjective:   This patient is here alone. Previous records as well as any outside records available were reviewed prior to todays visit.   Patient was last seen on 12/27/2022 with a SLUMS of 17/30, unable to do MoCA.***    Any changes in memory since last visit? ".  Reports difficulty remembering new information, conversations, names, counting.  Long-term memory is fair. repeats oneself?  Endorsed Disoriented  when walking into a room?  Patient denies ***  Misplacing objects?  Patient denies   Wandering behavior?   denies   Any personality changes since last visit?  She has a history of PTSD, depression, anxiety and is going to resume psychiatrist in Any worsening depression?: denies   Hallucinations or paranoia?  Denies. Seizures?   denies    Any sleep changes?  Does not sleep very well, takes trazodone with some relief denies vivid dreams, REM behavior or sleepwalking   Sleep apnea?   denies ***  Any hygiene concerns?   denies   Independent of bathing and dressing?  Endorsed  Does the patient needs help with medications? Patient is in charge *** Who is in charge of the finances?  Patient is in charge   *** Any changes in appetite?  denies ***   Patient have trouble swallowing?  denies   Does the patient cook?  No due to right hand weakness and numbness residual after first stroke Any headaches?    denies   Vision changes? denies Chronic pain?  Endorsed, chronic hip, arm pain due to arthritis.  Sees Dr. Saunders Revel at the pain clinic. Ambulates with difficulty?    denies ***  Recent falls or head injuries?    denies      Unilateral weakness, numbness or tingling?   Endorsed, chronic right forearm right hand weakness after stroke Any tremors?  denies   Any anosmia?    denies   Any incontinence of urine?  Endorsed, wears Depends Any bowel dysfunction?  denies      Patient lives with her husband and her son*** Does the patient drive?  Yes, "not much since the stroke "*** Tobacco?  Endorsed, 1  pack/day***  Past Medical History:  Diagnosis Date   Anxiety    Arthritis    Bursitis    Carpal tunnel syndrome    COPD (chronic obstructive pulmonary disease) (HCC)    Depression    Expressive aphasia    Difficulty "finding words" since stroke 06/12/21   Fibromyalgia    GERD (gastroesophageal reflux disease)    Heart murmur    "all my life".  followed by PCP   Hemorrhoids 10/27/2014    Hyperlipidemia    Hypertension    Kidney stones    Right sided weakness    S/P stroke 06/12/21   Stroke (HCC) 06/12/2021     Past Surgical History:  Procedure Laterality Date   CARPAL TUNNEL RELEASE Right    carpel tunnel    CATARACT EXTRACTION W/PHACO Left 08/08/2021   Procedure: CATARACT EXTRACTION PHACO AND INTRAOCULAR LENS PLACEMENT (IOC) LEFT;  Surgeon: Nevada Crane, MD;  Location: [facility];  Service: Ophthalmology;  Laterality: Left;  2.35 0:17.4   CATARACT EXTRACTION W/PHACO Right 08/20/2021   Procedure: CATARACT EXTRACTION PHACO AND INTRAOCULAR LENS PLACEMENT (IOC) RIGHT;  Surgeon: Nevada Crane, MD;  Location: [facility];  Service: Ophthalmology;  Laterality: Right;  2.63 0:25.5   CESAREAN SECTION     CHOLECYSTECTOMY     ENDOMETRIAL ABLATION     EXCISIONAL HEMORRHOIDECTOMY     IR ANGIO EXTRACRAN SEL COM CAROTID INNOMINATE UNI R MOD SED  06/12/2021   IR CT HEAD LTD  06/12/2021   IR CT HEAD LTD  06/12/2021   IR INTRAVSC STENT CERV CAROTID W/O EMB-PROT MOD SED INC ANGIO  06/12/2021   IR PERCUTANEOUS ART THROMBECTOMY/INFUSION INTRACRANIAL INC DIAG ANGIO  06/12/2021   OOPHORECTOMY Left    ORIF ANKLE FRACTURE BIMALLEOLAR     RADIOLOGY WITH ANESTHESIA N/A 06/12/2021   Procedure: IR WITH ANESTHESIA;  Surgeon: Radiologist, Medication, MD;  Location: [facility];  Service: Radiology;  Laterality: N/A;     PREVIOUS MEDICATIONS:   CURRENT MEDICATIONS:  Outpatient Encounter Medications as of 04/29/2023  Medication Sig   acetaminophen (TYLENOL) 325 MG tablet Take 2 tablets (650 mg total) by mouth every 4 (four) hours as needed for mild pain (or temp > 37.5 C (99.5 F)).   albuterol (VENTOLIN HFA) 108 (90 Base) MCG/ACT inhaler INHALE 2 PUFFS INTO THE LUNGS EVERY 6 HOURS AS NEEDED FOR UP TO 30 DAYS FOR WHEEZING   amLODipine (NORVASC) 5 MG tablet TAKE 1 TABLET BY MOUTH EVERY DAY   aspirin 81 MG chewable tablet Chew 1 tablet (81 mg total) by mouth daily.   atorvastatin  (LIPITOR) 20 MG tablet TAKE 1 TABLET BY MOUTH EVERY DAY AT BEDTIME   atorvastatin (LIPITOR) 40 MG tablet TAKE 1 TABLET BY MOUTH EVERY DAY AT BEDTIME   azelastine (ASTELIN) 0.1 % nasal spray Place 1 spray into both nostrils 2 (two) times daily. Use in each nostril as directed   busPIRone (BUSPAR) 15 MG tablet TAKE 1 TABLET BY MOUTH TWICE A DAY   calcium carbonate (TUMS - DOSED IN MG ELEMENTAL CALCIUM) 500 MG chewable tablet Chew 1 tablet (200 mg of elemental calcium total) by mouth 3 (three) times daily.   donepezil (ARICEPT) 5 MG tablet Take 1 tablet (5 mg total) by mouth daily.   fluticasone (FLONASE) 50 MCG/ACT nasal spray Place 1 spray into both nostrils daily.   gabapentin (NEURONTIN) 300 MG capsule Take 1 capsule (300 mg total) by mouth 2 (two) times daily.   hydrOXYzine (ATARAX) 25  MG tablet Take 1 tablet (25 mg total) by mouth 3 (three) times daily as needed for anxiety.   meloxicam (MOBIC) 15 MG tablet Take 1 tablet by mouth daily.   mupirocin ointment (BACTROBAN) 2 % Apply a small dab just inside the right nare twice daily.   omeprazole (PRILOSEC) 40 MG capsule Take 1 capsule (40 mg total) by mouth daily.   oxybutynin (DITROPAN) 5 MG tablet TAKE 1 TABLET BY MOUTH THREE TIMES A DAY   oxyCODONE-acetaminophen (PERCOCET/ROXICET) 5-325 MG tablet Take 1 tablet by mouth every 4 (four) hours as needed.   sertraline (ZOLOFT) 100 MG tablet TAKE 1 AND 1/2 TABLETS BY MOUTH ONCE DAILY   solifenacin (VESICARE) 5 MG tablet TAKE 1 TABLET BY MOUTH EVERY DAY   traMADol (ULTRAM) 50 MG tablet Take 1 tablet (50 mg total) by mouth every 12 (twelve) hours as needed.   traZODone (DESYREL) 150 MG tablet Take 1 tablet (150 mg total) by mouth at bedtime.   No facility-administered encounter medications on file as of 04/29/2023.     Objective:     PHYSICAL EXAMINATION:    VITALS:  There were no vitals filed for this visit.  GEN:  The patient appears stated age and is in NAD. HEENT:  Normocephalic,  atraumatic.   Neurological examination:  General: NAD, well-groomed, appears stated age. Orientation: The patient is alert. Oriented to person, place and date Cranial nerves: There is good facial symmetry.EOMI, visual fields decreased on the right greater than left, fundi not visualized.  The speech is fluent and clear.  She has aphasia with mild dysarthria. Fund of knowledge is appropriate. Recent memory impaired and remote memory is normal.  Attention and concentration are normal.  Able to name objects and repeat phrases.  Hearing is intact to conversational tone ***.   Delayed recall *** Sensation: Sensation is intact to light touch except right hand (dorsal and ventral).  Vibration intact at the bilateral big toe. Motor: Strength is at least antigravity x4. DTR's 2/4 in UE/LE       No data to display              No data to display             Movement examination: Tone: There is normal tone in the UE/LE Abnormal movements:  no tremor.  No myoclonus.  No asterixis.   Coordination:  There is no decremation with RAM's. Normal finger to nose  Gait and Station: The patient has no difficulty arising out of a deep-seated chair without the use of the hands. The patient's stride length is good.  Gait is cautious and narrow.   Thank you for allowing [place] the opportunity to participate in the care of this nice patient. Please do not hesitate to contact us for any questions or concerns.   Total time spent on today's visit was *** minutes dedicated to this patient today, preparing to see patient, examining the patient, ordering tests and/or medications and counseling the patient, documenting clinical information in the EHR or other health record, independently interpreting results and communicating results to the patient/family, discussing treatment and goals, answering patient's questions and coordinating care.  Cc:  Loyola Mast, MD  Marlowe Kays 04/29/2023 6:18 AM

## 2023-05-15 ENCOUNTER — Other Ambulatory Visit: Payer: Self-pay | Admitting: Family Medicine

## 2023-05-15 DIAGNOSIS — F339 Major depressive disorder, recurrent, unspecified: Secondary | ICD-10-CM

## 2023-05-16 ENCOUNTER — Other Ambulatory Visit: Payer: Self-pay | Admitting: Family Medicine

## 2023-05-16 DIAGNOSIS — M51372 Other intervertebral disc degeneration, lumbosacral region with discogenic back pain and lower extremity pain: Secondary | ICD-10-CM

## 2023-05-16 DIAGNOSIS — E785 Hyperlipidemia, unspecified: Secondary | ICD-10-CM

## 2023-05-16 MED ORDER — ATORVASTATIN CALCIUM 40 MG PO TABS: 40.0000 mg | ORAL_TABLET | Freq: Every day | ORAL | 3 refills | Status: AC

## 2023-05-16 MED ORDER — MELOXICAM 15 MG PO TABS: 15.0000 mg | ORAL_TABLET | Freq: Every day | ORAL | 3 refills | Status: AC

## 2023-06-12 ENCOUNTER — Telehealth: Payer: Self-pay

## 2023-06-12 NOTE — Telephone Encounter (Signed)
 Noted.   Will wait for patient to drop off the form. Dm/cma

## 2023-06-12 NOTE — Telephone Encounter (Signed)
 Forwarding message below

## 2023-06-12 NOTE — Telephone Encounter (Signed)
 Copied from CRM [id number]. Topic: General - Other >> Jun 12, 2023 11:53 AM Jon Gills C wrote: Reason for CRM: Patient called in stating she needs a from from [employer]  Plan for special needs for people that have heart issues or diabetes , doctor need to fill out form by the end of the month or patient will be dropped from plan   Can call at [phone]

## 2023-07-07 ENCOUNTER — Telehealth: Payer: Self-pay

## 2023-07-07 DIAGNOSIS — F5101 Primary insomnia: Secondary | ICD-10-CM

## 2023-07-07 MED ORDER — TRAZODONE HCL 150 MG PO TABS: 150.0000 mg | ORAL_TABLET | Freq: Every day | ORAL | 3 refills | Status: AC

## 2023-07-07 NOTE — Telephone Encounter (Signed)
 Refill request for  Trazodone150 mg LR 04/01/23, #90, 1 rf LOV  12/185/24 FOV  none scheduled.   Please review and advise. Thanks. Dm/cma

## 2023-07-11 NOTE — Telephone Encounter (Signed)
 Called patient to advise that there are refills at the pharmacy.  Called [facility] and they will get them ready for her. Dm/cma

## 2023-07-11 NOTE — Telephone Encounter (Signed)
 Patient called back to follow-up on medication refill. Additionally, she needs omeprazole , sertraline  and gabapentin  refilled as well. She stated she is out of everything. She asked for a call back from [facility]. Please call and advise.

## 2023-07-21 ENCOUNTER — Other Ambulatory Visit: Payer: Self-pay | Admitting: Family Medicine

## 2023-07-21 DIAGNOSIS — N3281 Overactive bladder: Secondary | ICD-10-CM

## 2023-07-21 NOTE — Telephone Encounter (Signed)
 Copied from CRM [id number]. Topic: Clinical - Medication Refill >> Jul 21, 2023  4:02 PM Earnestine Goes B wrote: Most Recent Primary Care Visit:  Provider: Graig Lawyer  Department: [facility]  Visit Type: OFFICE VISIT  Date: 03/05/2023  Medication: oxybutynin  (DITROPAN ) 5 MG tablet  Has the patient contacted their pharmacy? Yes (Agent: If no, request that the patient contact the pharmacy for the refill. If patient does not wish to contact the pharmacy document the reason why and proceed with request.) (Agent: If yes, when and what did the pharmacy advise?)  Is this the correct pharmacy for this prescription? Yes If no, delete pharmacy and type the correct one.  This is the patient's preferred pharmacy:  [facility] [address] - [place], [place] - [address], [address] [place] [address] Phone: [phone] Fax: [phone]   Has the prescription been filled recently? Yes  Is the patient out of the medication? Yes  Has the patient been seen for an appointment in the last year OR does the patient have an upcoming appointment? Yes  Can we respond through MyChart? Yes  Agent: Please be advised that Rx refills may take up to 3 business days. We ask that you follow-up with your pharmacy.

## 2023-07-22 ENCOUNTER — Other Ambulatory Visit: Payer: Self-pay | Admitting: Family Medicine

## 2023-07-22 DIAGNOSIS — M51379 Other intervertebral disc degeneration, lumbosacral region without mention of lumbar back pain or lower extremity pain: Secondary | ICD-10-CM

## 2023-07-22 NOTE — Telephone Encounter (Signed)
 Copied from CRM [id number]. Topic: Clinical - Medication Refill >> Jul 22, 2023  4:27 PM Albertha Alosa wrote: Most Recent Primary Care Visit:  Provider: Graig Lawyer  Department: [facility]  Visit Type: OFFICE VISIT  Date: 03/05/2023  Medication: traMADol  (ULTRAM ) 50 MG tablet  Has the patient contacted their pharmacy? Yes (Agent: If no, request that the patient contact the pharmacy for the refill. If patient does not wish to contact the pharmacy document the reason why and proceed with request.) (Agent: If yes, when and what did the pharmacy advise?)  Is this the correct pharmacy for this prescription? Yes If no, delete pharmacy and type the correct one.  This is the patient's preferred pharmacy:  [facility] [address] - [place], [place] - [address] [place] [address] Phone: [phone] Fax: [phone]   Has the prescription been filled recently? No  Is the patient out of the medication? Yes  Has the patient been seen for an appointment in the last year OR does the patient have an upcoming appointment? Yes  Can we respond through MyChart? Yes  Agent: Please be advised that Rx refills may take up to 3 business days. We ask that you follow-up with your pharmacy.

## 2023-07-22 NOTE — Telephone Encounter (Signed)
 Refill for  Tramadol  50 mg LR  04/18/23, #60, 2 rf LOV  03/05/23 FOV  none scheduled.   Please review and advise  Thanks. Dm/cma

## 2023-07-23 MED ORDER — TRAMADOL HCL 50 MG PO TABS: 50.0000 mg | ORAL_TABLET | Freq: Two times a day (BID) | ORAL | 2 refills | Status: AC | PRN

## 2023-09-12 ENCOUNTER — Encounter (HOSPITAL_COMMUNITY): Payer: Self-pay | Admitting: Interventional Radiology

## 2024-01-19 ENCOUNTER — Encounter: Payer: Self-pay | Admitting: Radiology

## 2024-01-30 ENCOUNTER — Telehealth: Payer: Self-pay

## 2024-01-30 NOTE — Telephone Encounter (Signed)
 error

## 2024-02-06 ENCOUNTER — Encounter: Payer: Self-pay | Admitting: Family Medicine

## 2024-02-06 ENCOUNTER — Ambulatory Visit (INDEPENDENT_AMBULATORY_CARE_PROVIDER_SITE_OTHER): Payer: Medicare (Managed Care) | Admitting: Family Medicine

## 2024-02-06 VITALS — BP 132/82 | HR 60 | Temp 98.4°F | Ht 65.0 in | Wt 144.0 lb

## 2024-02-06 DIAGNOSIS — Z4802 Encounter for removal of sutures: Secondary | ICD-10-CM

## 2024-02-06 DIAGNOSIS — S0101XD Laceration without foreign body of scalp, subsequent encounter: Secondary | ICD-10-CM

## 2024-02-06 DIAGNOSIS — N3281 Overactive bladder: Secondary | ICD-10-CM | POA: Diagnosis not present

## 2024-02-06 DIAGNOSIS — Z23 Encounter for immunization: Secondary | ICD-10-CM

## 2024-02-06 DIAGNOSIS — M797 Fibromyalgia: Secondary | ICD-10-CM | POA: Diagnosis not present

## 2024-02-06 MED ORDER — OXYBUTYNIN CHLORIDE 5 MG PO TABS: 5.0000 mg | ORAL_TABLET | Freq: Three times a day (TID) | ORAL | 3 refills | Status: AC

## 2024-02-06 MED ORDER — GABAPENTIN 300 MG PO CAPS: 300.0000 mg | ORAL_CAPSULE | Freq: Two times a day (BID) | ORAL | 3 refills | Status: AC

## 2024-02-06 NOTE — Assessment & Plan Note (Signed)
 Do not advise increasing the dosage of oxybutynin . I will plan to see her back within a  few weeks to address her chronic care needs.
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
## 2024-02-06 NOTE — Assessment & Plan Note (Signed)
 I will renew her gabapentin  today.

## 2024-02-27 ENCOUNTER — Ambulatory Visit: Payer: Medicare (Managed Care) | Admitting: Family Medicine

## 2024-02-27 NOTE — Assessment & Plan Note (Deleted)
 Continue sertraline  100 mg 1 1/2 tabs daily and trazodone  150 mg 1 1/2 tabs daily.

## 2024-02-27 NOTE — Assessment & Plan Note (Deleted)
 Blood pressure is in ... control. Continue amlodipine  5 mg daily. I will check renal function today.

## 2024-02-27 NOTE — Assessment & Plan Note (Deleted)
 Continue daily aspirin  for stroke prevention.
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
## 2024-02-27 NOTE — Assessment & Plan Note (Deleted)
 As above

## 2024-03-05 ENCOUNTER — Ambulatory Visit: Payer: Medicare (Managed Care) | Admitting: Family Medicine

## 2024-03-05 IMAGING — MG MM DIGITAL SCREENING BILAT W/ TOMO AND CAD
8 series · 8 of 24 positions shown · non-contrast
Comparison: Previous exam(s).

CLINICAL DATA: Screening.

EXAM:
DIGITAL SCREENING BILATERAL MAMMOGRAM WITH TOMOSYNTHESIS AND CAD
TECHNIQUE: Bilateral screening digital craniocaudal and mediolateral oblique
mammograms were obtained. Bilateral screening digital breast
tomosynthesis was performed. The images were evaluated with
computer-aided detection.

[L MLO synth-2D]
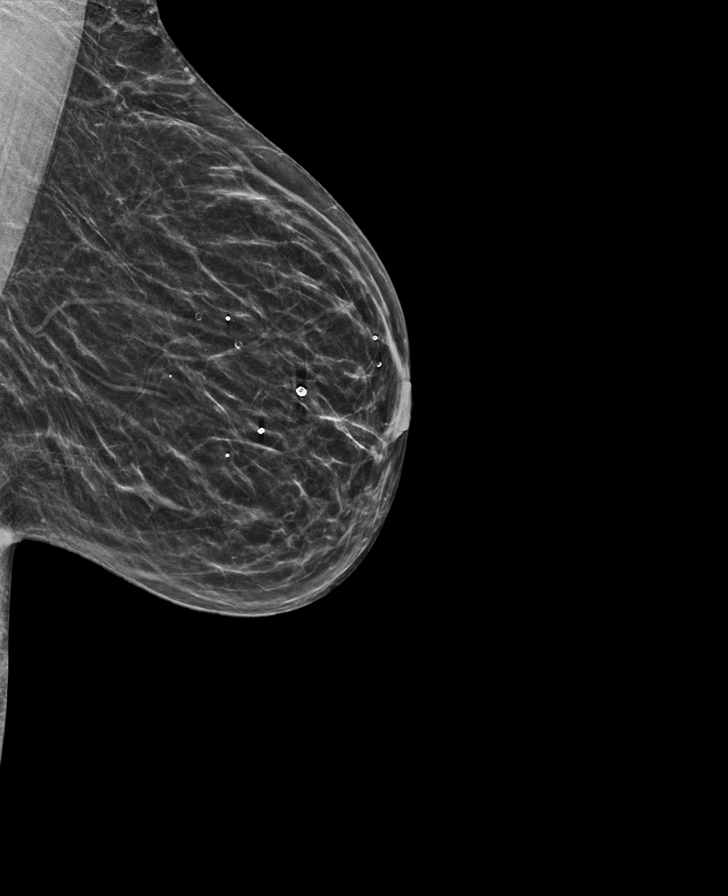

[L CC synth-2D]
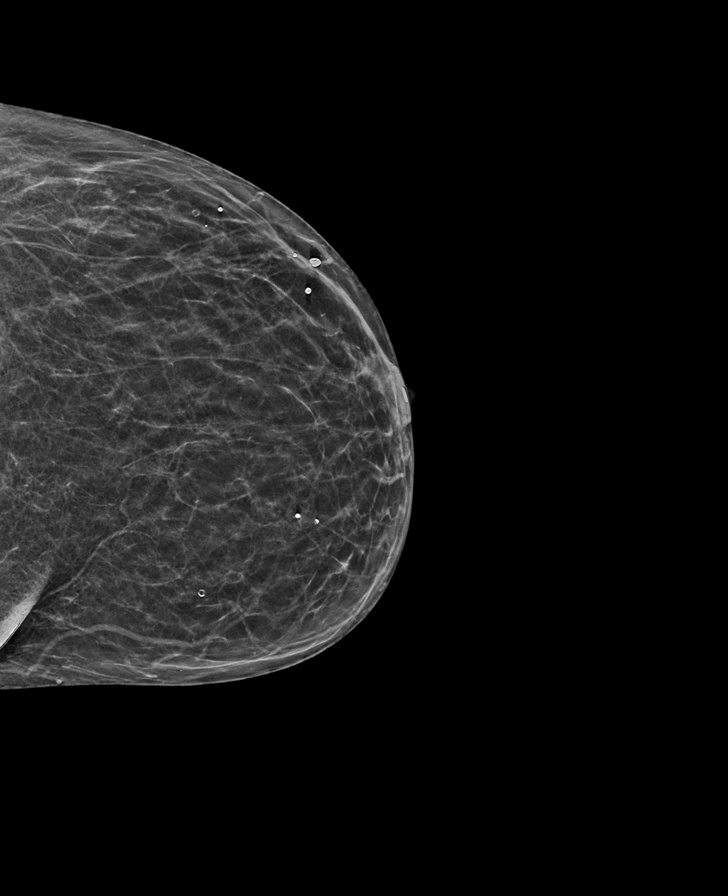

[R MLO synth-2D]
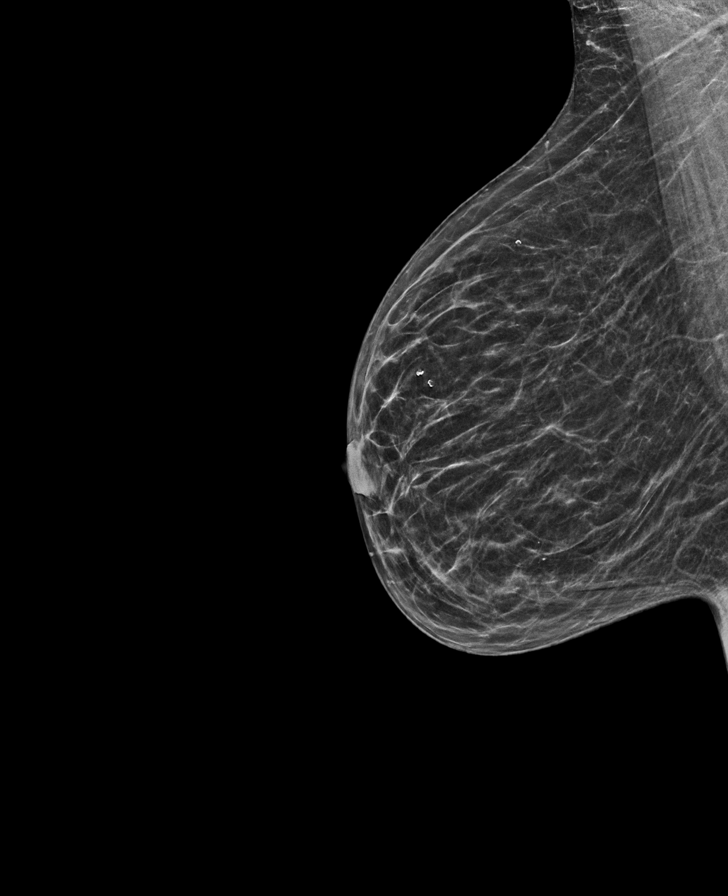

[R CC synth-2D]
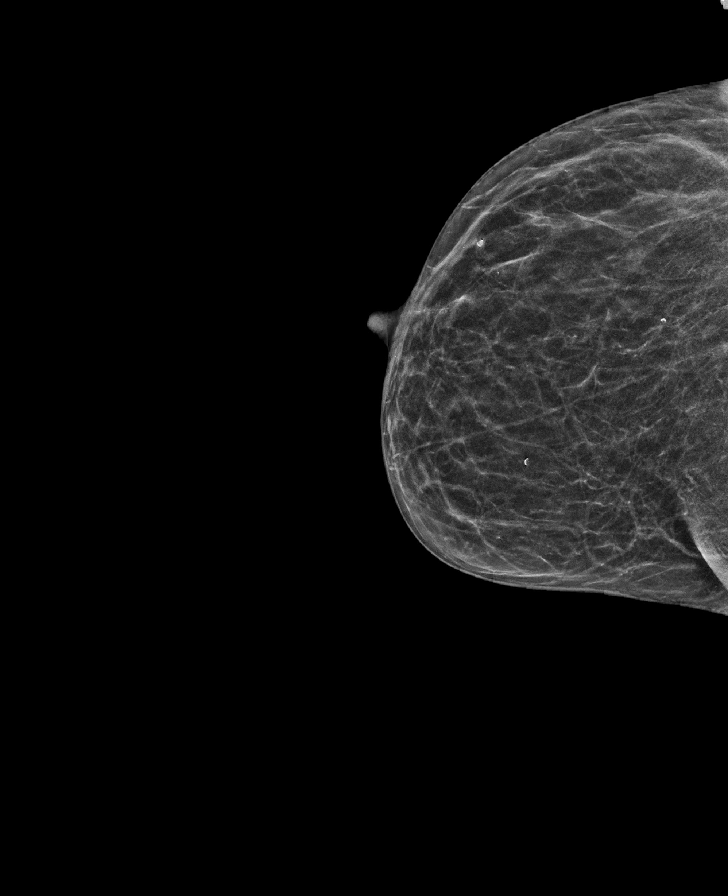

[R MLO tomo · tomo slice 25/50.0]
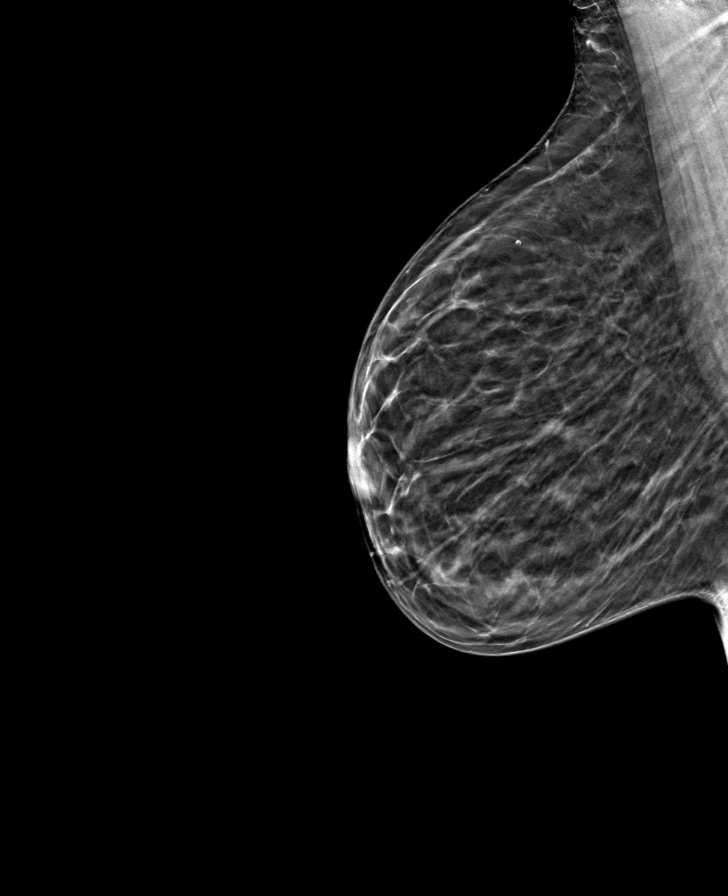

[L CC tomo · tomo slice 25/48.0]
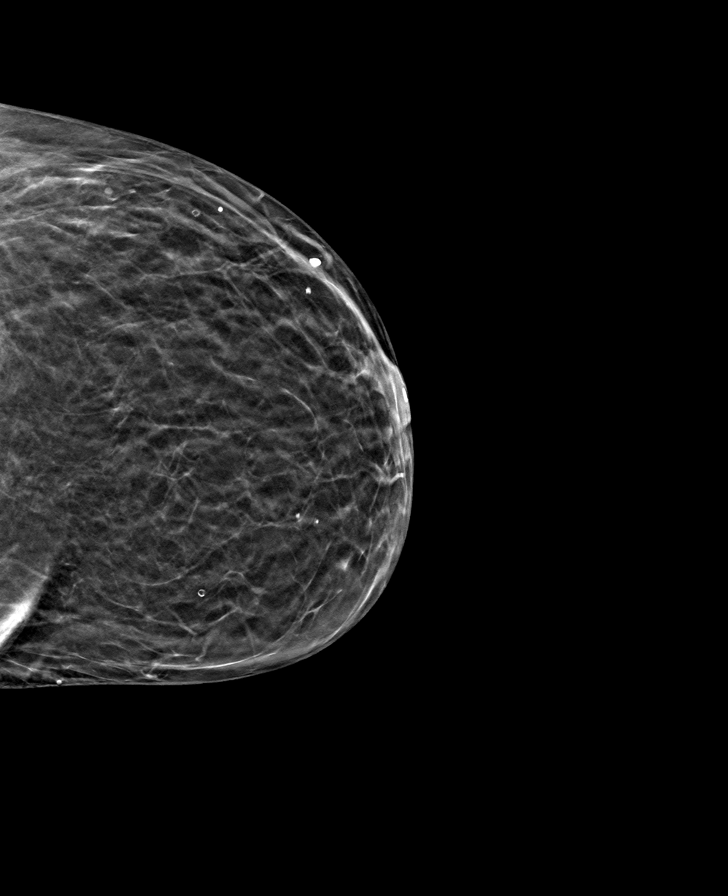

[L MLO tomo · tomo slice 27/52.0]
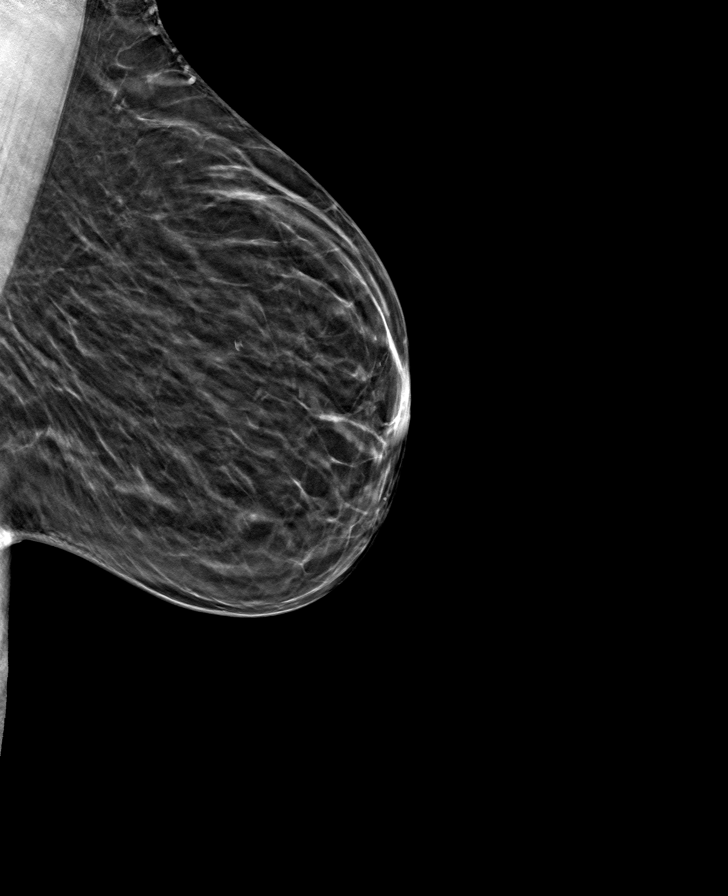

[R CC tomo · tomo slice 25/50.0]
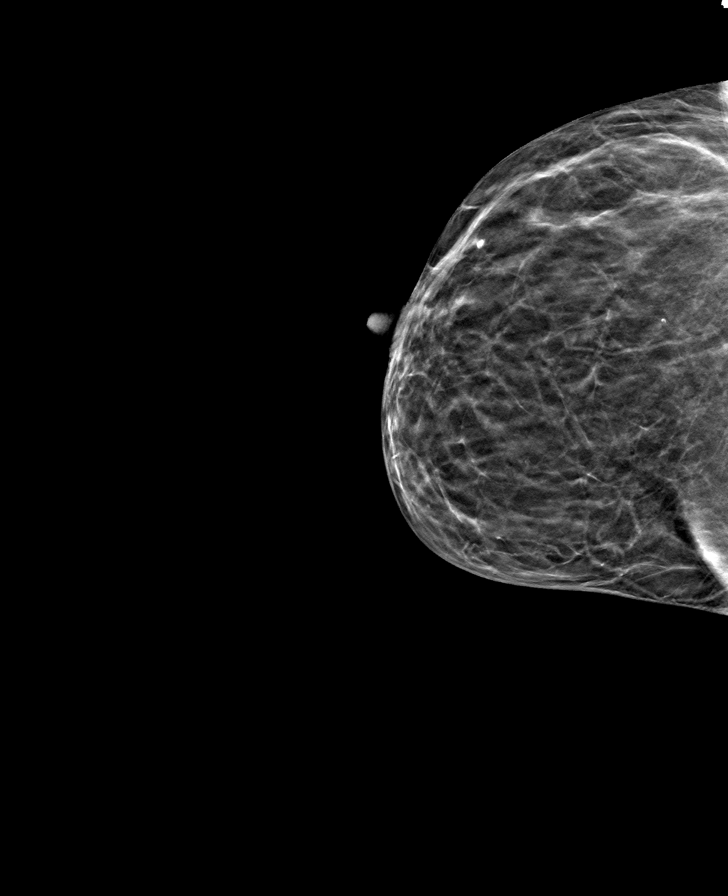

[8 of 24 positions shown; findings below may reference images not displayed]

ACR Breast Density Category b: There are scattered areas of
fibroglandular density.
FINDINGS: There are no findings suspicious for malignancy.
IMPRESSION: No mammographic evidence of malignancy. A result letter of this
screening mammogram will be mailed directly to the patient.

RECOMMENDATION:
Screening mammogram in one year. (Code:51-O-LD2)

BI-RADS CATEGORY  1: Negative.

## 2024-03-05 NOTE — Assessment & Plan Note (Deleted)
 As above
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
## 2024-03-05 NOTE — Assessment & Plan Note (Deleted)
 Continue atorvastatin 20mg  daily

## 2024-03-05 NOTE — Assessment & Plan Note (Deleted)
 Blood pressure is in ... control. Continue amlodipine  5 mg daily.

## 2024-03-05 NOTE — Assessment & Plan Note (Deleted)
 Continue daily aspirin  for stroke prevention.

## 2024-03-09 ENCOUNTER — Encounter: Payer: Self-pay | Admitting: Family Medicine

## 2024-04-12 ENCOUNTER — Ambulatory Visit: Payer: Medicaid - Out of State | Admitting: Family Medicine
